# Patient Record
Sex: Female | Born: 1940 | Race: White | Hispanic: No | Marital: Married | Smoking: Never smoker
Health system: Southern US, Community
[De-identification: ages and names within clinical notes are randomized; demographics above are authoritative.]

## PROBLEM LIST (undated history)

## (undated) DIAGNOSIS — I10 Essential (primary) hypertension: Secondary | ICD-10-CM

## (undated) DIAGNOSIS — F419 Anxiety disorder, unspecified: Secondary | ICD-10-CM

## (undated) DIAGNOSIS — F0390 Unspecified dementia without behavioral disturbance: Secondary | ICD-10-CM

## (undated) DIAGNOSIS — G935 Compression of brain: Secondary | ICD-10-CM

## (undated) DIAGNOSIS — Z8489 Family history of other specified conditions: Secondary | ICD-10-CM

## (undated) DIAGNOSIS — I609 Nontraumatic subarachnoid hemorrhage, unspecified: Secondary | ICD-10-CM

## (undated) HISTORY — PX: APPENDECTOMY: SHX54

## (undated) HISTORY — PX: ABDOMINAL HYSTERECTOMY: SHX81

---

## 1998-05-13 DIAGNOSIS — I671 Cerebral aneurysm, nonruptured: Secondary | ICD-10-CM

## 1998-05-13 HISTORY — DX: Cerebral aneurysm, nonruptured: I67.1

## 2016-04-24 DIAGNOSIS — R69 Illness, unspecified: Secondary | ICD-10-CM | POA: Diagnosis not present

## 2016-05-08 DIAGNOSIS — Z0100 Encounter for examination of eyes and vision without abnormal findings: Secondary | ICD-10-CM | POA: Diagnosis not present

## 2016-06-18 DIAGNOSIS — G4700 Insomnia, unspecified: Secondary | ICD-10-CM | POA: Diagnosis not present

## 2016-06-18 DIAGNOSIS — M170 Bilateral primary osteoarthritis of knee: Secondary | ICD-10-CM | POA: Diagnosis not present

## 2016-06-18 DIAGNOSIS — I10 Essential (primary) hypertension: Secondary | ICD-10-CM | POA: Diagnosis not present

## 2016-06-18 DIAGNOSIS — E041 Nontoxic single thyroid nodule: Secondary | ICD-10-CM | POA: Diagnosis not present

## 2016-06-18 DIAGNOSIS — M79641 Pain in right hand: Secondary | ICD-10-CM | POA: Diagnosis not present

## 2016-06-18 DIAGNOSIS — E559 Vitamin D deficiency, unspecified: Secondary | ICD-10-CM | POA: Diagnosis not present

## 2016-06-18 DIAGNOSIS — E7800 Pure hypercholesterolemia, unspecified: Secondary | ICD-10-CM | POA: Diagnosis not present

## 2016-06-18 DIAGNOSIS — R002 Palpitations: Secondary | ICD-10-CM | POA: Diagnosis not present

## 2016-06-18 DIAGNOSIS — Z0001 Encounter for general adult medical examination with abnormal findings: Secondary | ICD-10-CM | POA: Diagnosis not present

## 2016-07-12 DIAGNOSIS — Z6823 Body mass index (BMI) 23.0-23.9, adult: Secondary | ICD-10-CM | POA: Diagnosis not present

## 2016-07-12 DIAGNOSIS — M1812 Unilateral primary osteoarthritis of first carpometacarpal joint, left hand: Secondary | ICD-10-CM | POA: Diagnosis not present

## 2016-08-30 DIAGNOSIS — R69 Illness, unspecified: Secondary | ICD-10-CM | POA: Diagnosis not present

## 2016-08-30 DIAGNOSIS — Z79899 Other long term (current) drug therapy: Secondary | ICD-10-CM | POA: Diagnosis not present

## 2016-08-30 DIAGNOSIS — M859 Disorder of bone density and structure, unspecified: Secondary | ICD-10-CM | POA: Diagnosis not present

## 2016-08-30 DIAGNOSIS — E785 Hyperlipidemia, unspecified: Secondary | ICD-10-CM | POA: Diagnosis not present

## 2016-08-30 DIAGNOSIS — M19042 Primary osteoarthritis, left hand: Secondary | ICD-10-CM | POA: Diagnosis not present

## 2016-08-30 DIAGNOSIS — Z7983 Long term (current) use of bisphosphonates: Secondary | ICD-10-CM | POA: Diagnosis not present

## 2016-08-30 DIAGNOSIS — Z0000 Encounter for general adult medical examination without abnormal findings: Secondary | ICD-10-CM | POA: Diagnosis not present

## 2016-08-30 DIAGNOSIS — H9113 Presbycusis, bilateral: Secondary | ICD-10-CM | POA: Diagnosis not present

## 2016-08-30 DIAGNOSIS — G4700 Insomnia, unspecified: Secondary | ICD-10-CM | POA: Diagnosis not present

## 2016-09-16 DIAGNOSIS — Z961 Presence of intraocular lens: Secondary | ICD-10-CM | POA: Diagnosis not present

## 2016-09-16 DIAGNOSIS — H04123 Dry eye syndrome of bilateral lacrimal glands: Secondary | ICD-10-CM | POA: Diagnosis not present

## 2016-10-03 DIAGNOSIS — Z1231 Encounter for screening mammogram for malignant neoplasm of breast: Secondary | ICD-10-CM | POA: Diagnosis not present

## 2017-01-09 DIAGNOSIS — R69 Illness, unspecified: Secondary | ICD-10-CM | POA: Diagnosis not present

## 2017-03-24 DIAGNOSIS — R69 Illness, unspecified: Secondary | ICD-10-CM | POA: Diagnosis not present

## 2017-06-17 DIAGNOSIS — Z803 Family history of malignant neoplasm of breast: Secondary | ICD-10-CM | POA: Diagnosis not present

## 2017-06-17 DIAGNOSIS — Z8673 Personal history of transient ischemic attack (TIA), and cerebral infarction without residual deficits: Secondary | ICD-10-CM | POA: Diagnosis not present

## 2017-06-17 DIAGNOSIS — Z809 Family history of malignant neoplasm, unspecified: Secondary | ICD-10-CM | POA: Diagnosis not present

## 2017-06-17 DIAGNOSIS — Z8249 Family history of ischemic heart disease and other diseases of the circulatory system: Secondary | ICD-10-CM | POA: Diagnosis not present

## 2017-06-17 DIAGNOSIS — F419 Anxiety disorder, unspecified: Secondary | ICD-10-CM | POA: Diagnosis not present

## 2017-06-17 DIAGNOSIS — R69 Illness, unspecified: Secondary | ICD-10-CM | POA: Diagnosis not present

## 2017-06-17 DIAGNOSIS — K5900 Constipation, unspecified: Secondary | ICD-10-CM | POA: Diagnosis not present

## 2017-07-10 DIAGNOSIS — R69 Illness, unspecified: Secondary | ICD-10-CM | POA: Diagnosis not present

## 2017-07-17 DIAGNOSIS — R69 Illness, unspecified: Secondary | ICD-10-CM | POA: Diagnosis not present

## 2017-07-28 ENCOUNTER — Observation Stay (HOSPITAL_COMMUNITY)
Admission: AD | Admit: 2017-07-28 | Discharge: 2017-07-29 | Disposition: A | Discharge status: Home / Self Care | Payer: Medicare HMO | Source: Ambulatory Visit | Attending: General Surgery | Admitting: General Surgery

## 2017-07-28 ENCOUNTER — Encounter (HOSPITAL_COMMUNITY): Payer: Self-pay

## 2017-07-28 ENCOUNTER — Inpatient Hospital Stay (HOSPITAL_COMMUNITY): Payer: Medicare HMO

## 2017-07-28 DIAGNOSIS — D3A020 Benign carcinoid tumor of the appendix: Secondary | ICD-10-CM | POA: Insufficient documentation

## 2017-07-28 DIAGNOSIS — K3580 Unspecified acute appendicitis: Principal | ICD-10-CM | POA: Insufficient documentation

## 2017-07-28 DIAGNOSIS — K37 Unspecified appendicitis: Secondary | ICD-10-CM | POA: Diagnosis present

## 2017-07-28 DIAGNOSIS — R1031 Right lower quadrant pain: Secondary | ICD-10-CM | POA: Diagnosis present

## 2017-07-28 DIAGNOSIS — Z9049 Acquired absence of other specified parts of digestive tract: Secondary | ICD-10-CM

## 2017-07-28 DIAGNOSIS — R109 Unspecified abdominal pain: Secondary | ICD-10-CM | POA: Diagnosis not present

## 2017-07-28 HISTORY — DX: Compression of brain: G93.5

## 2017-07-28 HISTORY — DX: Compression of brain: I60.9

## 2017-07-28 LAB — URINALYSIS, ROUTINE W REFLEX MICROSCOPIC
Bacteria, UA: NONE SEEN
Bilirubin Urine: NEGATIVE
Glucose, UA: 50 mg/dL — AB
Hgb urine dipstick: NEGATIVE
Ketones, ur: 20 mg/dL — AB
Nitrite: NEGATIVE
PH: 6 (ref 5.0–8.0)
Protein, ur: NEGATIVE mg/dL
SPECIFIC GRAVITY, URINE: 1.017 (ref 1.005–1.030)

## 2017-07-28 LAB — CBC
HEMATOCRIT: 39.7 % (ref 36.0–46.0)
Hemoglobin: 13.8 g/dL (ref 12.0–15.0)
MCH: 32.8 pg (ref 26.0–34.0)
MCHC: 34.8 g/dL (ref 30.0–36.0)
MCV: 94.3 fL (ref 78.0–100.0)
Platelets: 164 10*3/uL (ref 150–400)
RBC: 4.21 MIL/uL (ref 3.87–5.11)
RDW: 12.8 % (ref 11.5–15.5)
WBC: 12.9 10*3/uL — ABNORMAL HIGH (ref 4.0–10.5)

## 2017-07-28 LAB — COMPREHENSIVE METABOLIC PANEL
ALK PHOS: 66 U/L (ref 38–126)
ALT: 25 U/L (ref 14–54)
AST: 24 U/L (ref 15–41)
Albumin: 3.9 g/dL (ref 3.5–5.0)
Anion gap: 12 (ref 5–15)
BILIRUBIN TOTAL: 0.8 mg/dL (ref 0.3–1.2)
BUN: 12 mg/dL (ref 6–20)
CALCIUM: 9.5 mg/dL (ref 8.9–10.3)
CO2: 23 mmol/L (ref 22–32)
CREATININE: 0.7 mg/dL (ref 0.44–1.00)
Chloride: 99 mmol/L — ABNORMAL LOW (ref 101–111)
GFR calc Af Amer: >60 mL/min (ref >60)
GFR calc non Af Amer: >60 mL/min (ref >60)
GLUCOSE: 139 mg/dL — AB (ref 65–99)
Potassium: 3.9 mmol/L (ref 3.5–5.1)
Sodium: 134 mmol/L — ABNORMAL LOW (ref 135–145)
TOTAL PROTEIN: 7.3 g/dL (ref 6.5–8.1)

## 2017-07-28 MED ORDER — IOPAMIDOL (ISOVUE-300) INJECTION 61%
100.0000 mL | Freq: Once | INTRAVENOUS | Status: AC | PRN
  Administered 2017-07-28: 100 mL via INTRAVENOUS

## 2017-07-28 MED ORDER — IOPAMIDOL (ISOVUE-300) INJECTION 61%
30.0000 mL | INTRAVENOUS | Status: AC
  Administered 2017-07-28: 30 mL via ORAL

## 2017-07-28 MED ORDER — OXYCODONE-ACETAMINOPHEN 5-325 MG PO TABS
1.0000 | ORAL_TABLET | Freq: Once | ORAL | Status: AC
  Administered 2017-07-28: 1 via ORAL
  Filled 2017-07-28: qty 1

## 2017-07-28 NOTE — MAU Provider Note (Signed)
  History     CSN: [id number]  Arrival date and time: 07/28/17 1948   First Provider Initiated Contact with Patient 07/28/17 2044      Chief Complaint  Patient presents with  . Abdominal Pain   HPI Patient Shirley Woods is a 77 y.o. G11P2002 Non-pregnant female here with complaint of RLQ pain. She denies vaginal bleeding, unusual discharge, dysuria or other gyn complaint. She has had a total hysterectomy. She rates the pain an 8/10; she has not had an appetite today. She has felt nauseated, but has not thrown up or had diarrhea.  She says nothing makes it better or worse, and she has tried tylenol which did not help.  OB History    Gravida Para Term Preterm AB Living   2 2 2     2    SAB TAB Ectopic Multiple Live Births                  Past Medical History:  Diagnosis Date  . 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 ABDOMINAL HYSTERECTOMY      No family history on file.  Social History   Tobacco Use  . Smoking status: Never Smoker  . Smokeless tobacco: Never Used  Substance Use Topics  . Alcohol use: Yes  . Drug use: No    Allergies: No Known Allergies  No medications prior to admission.    Review of Systems  Constitutional: Negative.   HENT: Negative.   Respiratory: Negative.   Cardiovascular: Negative.   Gastrointestinal: Positive for abdominal pain and nausea. Negative for abdominal distention, blood in stool and constipation.  Endocrine: Negative.   Genitourinary: Negative.   Musculoskeletal: Negative.   Neurological: Negative.    Physical Exam   Blood pressure (!) 159/75, pulse 97, temperature 99.5 F (37.5 C), temperature source Oral, resp. rate 20, height 5\' 2"  (1.575 m), weight 121 lb 12 oz (55.2 kg).  Physical Exam  Constitutional: She is oriented to person, place, and time. She appears well-developed.  HENT:  Head: Normocephalic.  Eyes: Pupils are  equal, round, and reactive to light.  Neck: Normal range of motion.  Cardiovascular: Normal rate.  Respiratory: Effort normal.  GI: Soft.  Musculoskeletal: Normal range of motion.  Neurological: She is alert and oriented to person, place, and time.  Skin: Skin is warm and dry.  Psychiatric: She has a normal mood and affect.    MAU Course  Procedures  MDM -CT with contrast shows acute appendicitis -CBC and CMP-normal white count.  -Vitals stable Assessment and Plan   1. Acute appendicitis, unspecified acute appendicitis type    2. Patient stable for transfer to [place].  3. Dr. Dina Rich MD attending physician accepting transfer 4. Patient made aware of plan of care; RN to notify [place].   Mervyn Skeeters Alexandre Faries 07/28/2017, 9:13 PM

## 2017-07-28 NOTE — MAU Note (Signed)
PT SAYS SHE HAS RIGHT SIDED  ABD  PAIN - STARTED   YESTERDAY - WORSE  TODAY.    MOVED HERE FROM [place]-   HAS AN APPOINTMENT  IN 2  WEEKS   TO GET ESTABLISHED.    THIS AFTERNOON - HAD  TEMP 100.5-  NO TYLENOL TODAY .   YESTERDAY TOOK IBUPROFEN

## 2017-07-29 ENCOUNTER — Encounter (HOSPITAL_COMMUNITY): Admission: AD | Disposition: A | Discharge status: Home / Self Care | Payer: Self-pay | Source: Ambulatory Visit

## 2017-07-29 ENCOUNTER — Inpatient Hospital Stay (HOSPITAL_COMMUNITY): Payer: Medicare HMO | Admitting: Anesthesiology

## 2017-07-29 ENCOUNTER — Encounter (HOSPITAL_COMMUNITY): Payer: Self-pay | Admitting: Certified Registered"

## 2017-07-29 ENCOUNTER — Other Ambulatory Visit: Payer: Self-pay

## 2017-07-29 DIAGNOSIS — K3580 Unspecified acute appendicitis: Secondary | ICD-10-CM | POA: Diagnosis not present

## 2017-07-29 DIAGNOSIS — K37 Unspecified appendicitis: Secondary | ICD-10-CM | POA: Diagnosis present

## 2017-07-29 DIAGNOSIS — D3A020 Benign carcinoid tumor of the appendix: Secondary | ICD-10-CM | POA: Diagnosis not present

## 2017-07-29 DIAGNOSIS — Z9049 Acquired absence of other specified parts of digestive tract: Secondary | ICD-10-CM

## 2017-07-29 HISTORY — PX: LAPAROSCOPIC APPENDECTOMY: SHX408

## 2017-07-29 LAB — CBC
HCT: 37.8 % (ref 36.0–46.0)
HEMOGLOBIN: 12.4 g/dL (ref 12.0–15.0)
MCH: 32.2 pg (ref 26.0–34.0)
MCHC: 32.8 g/dL (ref 30.0–36.0)
MCV: 98.2 fL (ref 78.0–100.0)
Platelets: 153 10*3/uL (ref 150–400)
RBC: 3.85 MIL/uL — ABNORMAL LOW (ref 3.87–5.11)
RDW: 13.3 % (ref 11.5–15.5)
WBC: 11 10*3/uL — ABNORMAL HIGH (ref 4.0–10.5)

## 2017-07-29 LAB — CREATININE, SERUM
CREATININE: 0.91 mg/dL (ref 0.44–1.00)
GFR calc Af Amer: >60 mL/min (ref >60)
GFR calc non Af Amer: >60 mL/min (ref >60)

## 2017-07-29 LAB — SURGICAL PCR SCREEN
MRSA, PCR: NEGATIVE
STAPHYLOCOCCUS AUREUS: NEGATIVE

## 2017-07-29 SURGERY — APPENDECTOMY, LAPAROSCOPIC
Anesthesia: General | Site: Abdomen

## 2017-07-29 MED ORDER — ONDANSETRON 4 MG PO TBDP: 4.0000 mg | ORAL_TABLET | Freq: Four times a day (QID) | ORAL | Status: DC | PRN

## 2017-07-29 MED ORDER — HYDROMORPHONE HCL 1 MG/ML IJ SOLN: 0.5000 mg | INTRAMUSCULAR | Status: DC | PRN

## 2017-07-29 MED ORDER — MORPHINE SULFATE (PF) 4 MG/ML IV SOLN
INTRAVENOUS | Status: AC
  Filled 2017-07-29: qty 1

## 2017-07-29 MED ORDER — SUGAMMADEX SODIUM 200 MG/2ML IV SOLN
INTRAVENOUS | Status: DC | PRN
  Administered 2017-07-29: 100 mg via INTRAVENOUS

## 2017-07-29 MED ORDER — DEXTROSE-NACL 5-0.9 % IV SOLN
INTRAVENOUS | Status: DC
  Administered 2017-07-29: 04:00:00 via INTRAVENOUS

## 2017-07-29 MED ORDER — PHENYLEPHRINE HCL 10 MG/ML IJ SOLN
INTRAMUSCULAR | Status: DC | PRN
  Administered 2017-07-29: 80 ug via INTRAVENOUS

## 2017-07-29 MED ORDER — ONDANSETRON HCL 4 MG/2ML IJ SOLN: 4.0000 mg | Freq: Four times a day (QID) | INTRAMUSCULAR | Status: DC | PRN

## 2017-07-29 MED ORDER — DEXAMETHASONE SODIUM PHOSPHATE 10 MG/ML IJ SOLN
INTRAMUSCULAR | Status: DC | PRN
  Administered 2017-07-29: 10 mg via INTRAVENOUS

## 2017-07-29 MED ORDER — MORPHINE SULFATE (PF) 4 MG/ML IV SOLN: 2.0000 mg | INTRAVENOUS | Status: DC | PRN

## 2017-07-29 MED ORDER — HYDRALAZINE HCL 20 MG/ML IJ SOLN: 10.0000 mg | INTRAMUSCULAR | Status: DC | PRN

## 2017-07-29 MED ORDER — LORAZEPAM 2 MG/ML IJ SOLN
1.0000 mg | INTRAMUSCULAR | Status: DC | PRN
  Administered 2017-07-29: 1 mg via INTRAVENOUS
  Filled 2017-07-29: qty 1

## 2017-07-29 MED ORDER — METHOCARBAMOL 500 MG PO TABS: 500.0000 mg | ORAL_TABLET | Freq: Four times a day (QID) | ORAL | Status: DC | PRN

## 2017-07-29 MED ORDER — ENOXAPARIN SODIUM 40 MG/0.4ML ~~LOC~~ SOLN: 40.0000 mg | SUBCUTANEOUS | Status: DC

## 2017-07-29 MED ORDER — FENTANYL CITRATE (PF) 100 MCG/2ML IJ SOLN
INTRAMUSCULAR | Status: DC | PRN
  Administered 2017-07-29 (×2): 50 ug via INTRAVENOUS

## 2017-07-29 MED ORDER — OXYCODONE HCL 5 MG PO TABS
5.0000 mg | ORAL_TABLET | ORAL | Status: DC | PRN
  Administered 2017-07-29: 5 mg via ORAL
  Filled 2017-07-29: qty 1

## 2017-07-29 MED ORDER — CELECOXIB 200 MG PO CAPS
200.0000 mg | ORAL_CAPSULE | Freq: Two times a day (BID) | ORAL | Status: DC
  Administered 2017-07-29: 200 mg via ORAL
  Filled 2017-07-29: qty 1

## 2017-07-29 MED ORDER — METRONIDAZOLE IN NACL 5-0.79 MG/ML-% IV SOLN
500.0000 mg | Freq: Three times a day (TID) | INTRAVENOUS | Status: DC
  Administered 2017-07-29: 500 mg via INTRAVENOUS
  Filled 2017-07-29: qty 100

## 2017-07-29 MED ORDER — ROCURONIUM BROMIDE 100 MG/10ML IV SOLN
INTRAVENOUS | Status: DC | PRN
  Administered 2017-07-29: 30 mg via INTRAVENOUS
  Administered 2017-07-29: 10 mg via INTRAVENOUS

## 2017-07-29 MED ORDER — OXYCODONE HCL 5 MG PO TABS: 5.0000 mg | ORAL_TABLET | Freq: Four times a day (QID) | ORAL | 0 refills | Status: DC | PRN

## 2017-07-29 MED ORDER — ONDANSETRON HCL 4 MG/2ML IJ SOLN
INTRAMUSCULAR | Status: AC
  Filled 2017-07-29: qty 2

## 2017-07-29 MED ORDER — ACETAMINOPHEN 650 MG RE SUPP: 650.0000 mg | Freq: Four times a day (QID) | RECTAL | Status: DC | PRN

## 2017-07-29 MED ORDER — SODIUM CHLORIDE 0.9 % IR SOLN
Status: DC | PRN
  Administered 2017-07-29: 1000 mL

## 2017-07-29 MED ORDER — LACTATED RINGERS IV SOLN
INTRAVENOUS | Status: DC | PRN
  Administered 2017-07-29: 07:00:00 via INTRAVENOUS

## 2017-07-29 MED ORDER — GABAPENTIN 300 MG PO CAPS
300.0000 mg | ORAL_CAPSULE | Freq: Two times a day (BID) | ORAL | Status: DC
  Administered 2017-07-29: 300 mg via ORAL
  Filled 2017-07-29: qty 1

## 2017-07-29 MED ORDER — 0.9 % SODIUM CHLORIDE (POUR BTL) OPTIME
TOPICAL | Status: DC | PRN
  Administered 2017-07-29: 1000 mL

## 2017-07-29 MED ORDER — PROMETHAZINE HCL 25 MG/ML IJ SOLN: 6.2500 mg | INTRAMUSCULAR | Status: DC | PRN

## 2017-07-29 MED ORDER — DEXAMETHASONE SODIUM PHOSPHATE 10 MG/ML IJ SOLN
INTRAMUSCULAR | Status: AC
Start: 2017-07-29 — End: 2017-07-29
  Filled 2017-07-29: qty 1

## 2017-07-29 MED ORDER — DIPHENHYDRAMINE HCL 12.5 MG/5ML PO ELIX: 12.5000 mg | ORAL_SOLUTION | Freq: Four times a day (QID) | ORAL | Status: DC | PRN

## 2017-07-29 MED ORDER — FENTANYL CITRATE (PF) 250 MCG/5ML IJ SOLN
INTRAMUSCULAR | Status: AC
  Filled 2017-07-29: qty 5

## 2017-07-29 MED ORDER — DIPHENHYDRAMINE HCL 50 MG/ML IJ SOLN: 12.5000 mg | Freq: Four times a day (QID) | INTRAMUSCULAR | Status: DC | PRN

## 2017-07-29 MED ORDER — SODIUM CHLORIDE 0.9 % IV SOLN
2.0000 g | Freq: Every day | INTRAVENOUS | Status: DC
  Administered 2017-07-29: 2 g via INTRAVENOUS
  Filled 2017-07-29: qty 20

## 2017-07-29 MED ORDER — FENTANYL CITRATE (PF) 100 MCG/2ML IJ SOLN: 25.0000 ug | INTRAMUSCULAR | Status: DC | PRN

## 2017-07-29 MED ORDER — BUPIVACAINE-EPINEPHRINE 0.25% -1:200000 IJ SOLN
INTRAMUSCULAR | Status: DC | PRN
  Administered 2017-07-29: 16 mL

## 2017-07-29 MED ORDER — ACETAMINOPHEN 325 MG PO TABS: 650.0000 mg | ORAL_TABLET | Freq: Four times a day (QID) | ORAL | Status: DC | PRN

## 2017-07-29 MED ORDER — MORPHINE SULFATE (PF) 4 MG/ML IV SOLN
2.0000 mg | INTRAVENOUS | Status: DC | PRN
  Administered 2017-07-29: 2 mg via INTRAVENOUS
  Filled 2017-07-29: qty 1

## 2017-07-29 MED ORDER — LIDOCAINE HCL (CARDIAC) 20 MG/ML IV SOLN
INTRAVENOUS | Status: DC | PRN
  Administered 2017-07-29: 60 mg via INTRAVENOUS

## 2017-07-29 MED ORDER — SUGAMMADEX SODIUM 200 MG/2ML IV SOLN
INTRAVENOUS | Status: AC
  Filled 2017-07-29: qty 2

## 2017-07-29 MED ORDER — BUPIVACAINE-EPINEPHRINE (PF) 0.25% -1:200000 IJ SOLN
INTRAMUSCULAR | Status: AC
  Filled 2017-07-29: qty 30

## 2017-07-29 MED ORDER — PROPOFOL 10 MG/ML IV BOLUS
INTRAVENOUS | Status: AC
  Filled 2017-07-29: qty 20

## 2017-07-29 MED ORDER — PROPOFOL 10 MG/ML IV BOLUS
INTRAVENOUS | Status: DC | PRN
  Administered 2017-07-29: 110 mg via INTRAVENOUS

## 2017-07-29 MED ORDER — KCL IN DEXTROSE-NACL 20-5-0.45 MEQ/L-%-% IV SOLN
INTRAVENOUS | Status: DC
  Administered 2017-07-29: 10:00:00 via INTRAVENOUS
  Filled 2017-07-29: qty 1000

## 2017-07-29 MED ORDER — ONDANSETRON HCL 4 MG/2ML IJ SOLN
INTRAMUSCULAR | Status: DC | PRN
  Administered 2017-07-29: 4 mg via INTRAVENOUS

## 2017-07-29 SURGICAL SUPPLY — 41 items
APPLIER CLIP ROT 10 11.4 M/L (STAPLE)
BLADE CLIPPER SURG (BLADE) IMPLANT
CANISTER SUCT 3000ML PPV (MISCELLANEOUS) ×2 IMPLANT
CHLORAPREP W/TINT 26ML (MISCELLANEOUS) ×2 IMPLANT
CLIP APPLIE ROT 10 11.4 M/L (STAPLE) IMPLANT
COVER SURGICAL LIGHT HANDLE (MISCELLANEOUS) ×2 IMPLANT
CUTTER FLEX LINEAR 45M (STAPLE) ×2 IMPLANT
DERMABOND ADVANCED (GAUZE/BANDAGES/DRESSINGS) ×1
DERMABOND ADVANCED .7 DNX12 (GAUZE/BANDAGES/DRESSINGS) ×1 IMPLANT
ELECT REM PT RETURN 9FT ADLT (ELECTROSURGICAL) ×2
ELECTRODE REM PT RTRN 9FT ADLT (ELECTROSURGICAL) ×1 IMPLANT
GLOVE BIO SURGEON STRL SZ8 (GLOVE) ×2 IMPLANT
GLOVE BIOGEL PI IND STRL 8 (GLOVE) ×1 IMPLANT
GLOVE BIOGEL PI INDICATOR 8 (GLOVE) ×1
GOWN STRL REUS W/ TWL LRG LVL3 (GOWN DISPOSABLE) ×2 IMPLANT
GOWN STRL REUS W/ TWL XL LVL3 (GOWN DISPOSABLE) ×1 IMPLANT
GOWN STRL REUS W/TWL LRG LVL3 (GOWN DISPOSABLE) ×2
GOWN STRL REUS W/TWL XL LVL3 (GOWN DISPOSABLE) ×1
KIT BASIN OR (CUSTOM PROCEDURE TRAY) ×2 IMPLANT
KIT ROOM TURNOVER OR (KITS) ×2 IMPLANT
NEEDLE 22X1 1/2 (OR ONLY) (NEEDLE) ×2 IMPLANT
NS IRRIG 1000ML POUR BTL (IV SOLUTION) ×2 IMPLANT
PAD ARMBOARD 7.5X6 YLW CONV (MISCELLANEOUS) ×4 IMPLANT
POUCH RETRIEVAL ECOSAC 10 (ENDOMECHANICALS) ×1 IMPLANT
POUCH RETRIEVAL ECOSAC 10MM (ENDOMECHANICALS) ×1
RELOAD 45 VASCULAR/THIN (ENDOMECHANICALS) ×2 IMPLANT
RELOAD STAPLE TA45 3.5 REG BLU (ENDOMECHANICALS) IMPLANT
SCISSORS LAP 5X35 DISP (ENDOMECHANICALS) IMPLANT
SET IRRIG TUBING LAPAROSCOPIC (IRRIGATION / IRRIGATOR) ×2 IMPLANT
SHEARS HARMONIC ACE PLUS 36CM (ENDOMECHANICALS) ×2 IMPLANT
SPECIMEN JAR SMALL (MISCELLANEOUS) ×2 IMPLANT
SUT VIC AB 4-0 PS2 27 (SUTURE) ×2 IMPLANT
TOWEL OR 17X24 6PK STRL BLUE (TOWEL DISPOSABLE) ×2 IMPLANT
TOWEL OR 17X26 10 PK STRL BLUE (TOWEL DISPOSABLE) ×2 IMPLANT
TRAY FOLEY CATH SILVER 16FR (SET/KITS/TRAYS/PACK) ×2 IMPLANT
TRAY LAPAROSCOPIC MC (CUSTOM PROCEDURE TRAY) ×2 IMPLANT
TROCAR XCEL 12X100 BLDLESS (ENDOMECHANICALS) ×2 IMPLANT
TROCAR XCEL BLUNT TIP 100MML (ENDOMECHANICALS) ×2 IMPLANT
TROCAR XCEL NON-BLD 5MMX100MML (ENDOMECHANICALS) ×2 IMPLANT
TUBING INSUFFLATION (TUBING) ×2 IMPLANT
WATER STERILE IRR 1000ML POUR (IV SOLUTION) ×2 IMPLANT

## 2017-07-29 NOTE — ED Notes (Signed)
[facility] from [facility].  Patient is new to [place], went to [facility] with loss of appetite with abdominal pain and fever and was given a percocet which got rid of her pain.  Patient CAOx 4, 20G LAC.  General Surgeon to see.

## 2017-07-29 NOTE — Discharge Instructions (Signed)

## 2017-07-29 NOTE — Discharge Summary (Signed)
[facility]/Trauma Discharge Summary   Patient ID: Shirley Woods MRN: [id number] DOB/AGE: 10/02/1940 77 y.o.  Admit date: 07/28/2017 Discharge date: 07/29/2017  Admitting Diagnosis: appendicitis  Discharge Diagnosis Patient Active Problem List   Diagnosis Date Noted  . Appendicitis 07/29/2017  . S/P laparoscopic appendectomy 07/29/2017    Consultants none  Imaging: Ct Abdomen Pelvis W Contrast  Result Date: 07/28/2017 CLINICAL DATA:  Right-sided abdominal pain with fever EXAM: CT ABDOMEN AND PELVIS WITH CONTRAST TECHNIQUE: Multidetector CT imaging of the abdomen and pelvis was performed using the standard protocol following bolus administration of intravenous contrast. CONTRAST:  149mL ISOVUE-300 IOPAMIDOL (ISOVUE-300) INJECTION 61%, <See Chart> ISOVUE-300 IOPAMIDOL (ISOVUE-300) INJECTION 61% COMPARISON:  None. FINDINGS: Lower chest: Lung bases demonstrate no acute consolidation or pleural effusion. Normal heart size. Hepatobiliary: No focal liver abnormality is seen. No gallstones, gallbladder wall thickening, or biliary dilatation. Pancreas: Unremarkable. No pancreatic ductal dilatation or surrounding inflammatory changes. Fatty infiltration Spleen: Normal in size without focal abnormality. Multiple calcified granuloma Adrenals/Urinary Tract: Adrenal glands are within normal limits. No hydronephrosis. Probable parapelvic cysts on the left. The bladder is unremarkable. Stomach/Bowel: Stomach is nonenlarged. No dilated small bowel. Thickening of the terminal ileum and base of the cecum. Enlarged appendix measuring 11 mm with moderate-to-marked surrounding inflammation. Vascular/Lymphatic: Nonaneurysmal aorta. Mild aortic atherosclerosis. No significantly enlarged lymph nodes Reproductive: Status post hysterectomy. No adnexal masses. Other: Negative for free air or free fluid. Small fat in the umbilical region Musculoskeletal: Degenerative changes. No acute or suspicious  abnormality IMPRESSION: 1. Findings consistent with acute appendicitis. Appendix: Location: Right lower quadrant Diameter: 11 mm Appendicolith: None Mucosal hyper-enhancement: Not seen Extraluminal gas: Not seen Periappendiceal collection: Not seen 2.   Splenic granuloma Critical Value/emergent results were called by telephone at the time of interpretation on 07/28/2017 at 11:33 pm to Dr. Maye Hides , who verbally acknowledged these results. Electronically Signed   By: Donavan Foil M.D.   On: 07/28/2017 23:33    Procedures Dr. Grandville Silos (07/29/17) - Laparoscopic Appendectomy  Hospital Course:  Pt is a 77 yo female who presented to [facility] with abdominal pain.  Workup showed appendicitis.  Patient was admitted and underwent procedure listed above.  Tolerated procedure well and was transferred to the floor.  Diet was advanced as tolerated.  On POD#0, the patient was voiding well, tolerating diet, ambulating well, pain well controlled, vital signs stable, incisions c/d/i and felt stable for discharge home.  Patient will follow up in our office in 2 weeks and knows to call with questions or concerns.     Patient was discharged in good condition.  The [place] Substance controlled database was reviewed prior to prescribing narcotic pain medication to this patient.  Physical Exam: General:  Alert, NAD, pleasant, cooperative Cardio: RRR, S1 & S2 normal, no murmur, rubs, gallops Resp: Effort normal, lungs CTA bilaterally, no wheezes, rales, rhonchi Abd:  Soft, ND, normal bowel sounds, very mild tenderness, incisions with glue intact, mild surrounding ecchymosis and are without bleeding Skin: warm and dry, no rashes noted  Allergies as of 07/29/2017   No Known Allergies     Medication List    TAKE these medications   LORazepam 1 MG tablet Commonly known as:  ATIVAN Take 1 mg by mouth at bedtime.   OVER THE COUNTER MEDICATION Take 3 tablets by mouth 2 (two) times daily. algea cal    OVER THE COUNTER MEDICATION Take 1 tablet by mouth 2 (two) times daily. stronium boost   oxyCODONE  5 MG immediate release tablet Commonly known as:  Oxy IR/ROXICODONE Take 1 tablet (5 mg total) by mouth every 6 (six) hours as needed for moderate pain.   sertraline 100 MG tablet Commonly known as:  ZOLOFT Take 100 mg by mouth daily.        Follow-up Information    [facility]. Go on 08/12/2017.   Specialty:  General Surgery Why:  Your appointment is at 11:45 AM. Please arrive 30 min prior appointment time. Bring photo ID and insurance information.  Contact information: [address] [place] [address] [phone]           Signed: [facility] 07/29/2017, 3:05 PM Pager: [phone] Consults: [phone] Mon-Fri 7:00 am-4:30 pm Sat-Sun 7:00 am-11:30 am

## 2017-07-29 NOTE — Progress Notes (Signed)
Verified waste of oxycodone 5mg  tablet with Mendel Ryder B. Edison Pace, RN in Radiation protection practitioner

## 2017-07-29 NOTE — Transfer of Care (Signed)
Immediate Anesthesia Transfer of Care Note  Patient: Shirley Woods  Procedure(s) Performed: APPENDECTOMY LAPAROSCOPIC (N/A Abdomen)  Patient Location: PACU  Anesthesia Type:General  Level of Consciousness: awake, alert  and oriented  Airway & Oxygen Therapy: Patient Spontanous Breathing  Post-op Assessment: Report given to RN and Post -op Vital signs reviewed and stable  Post vital signs: Reviewed and stable  Last Vitals:  Vitals:   07/29/17 0322 07/29/17 0500  BP: 137/73 128/68  Pulse: 88 92  Resp: 18 17  Temp: 37.4 C 37.3 C  SpO2: 99% 98%    Last Pain:  Vitals:   07/29/17 0500  TempSrc: Oral  PainSc:          Complications: No apparent anesthesia complications

## 2017-07-29 NOTE — H&P (Signed)
Shirley Woods is an 77 y.o. female.   Chief Complaint: abdominal pain HPI: pt sent from [facility].  1 day hx of RLQ abdominal pain  Sharp amade worse with movement and progressive.  No N/V.  CT shows uncomplicated appendicitis.    Past Medical History:  Diagnosis Date  . 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 ABDOMINAL HYSTERECTOMY      No family history on file. Social History:  reports that  has never smoked. she has never used smokeless tobacco. She reports that she drinks alcohol. She reports that she does not use drugs.  Allergies: No Known Allergies   (Not in a hospital admission)  Results for orders placed or performed during the hospital encounter of 07/28/17 (from the past 48 hour(s))  Urinalysis, Routine w reflex microscopic     Status: Abnormal   Collection Time: 07/28/17  8:04 PM  Result Value Ref Range   Color, Urine YELLOW YELLOW   APPearance CLEAR CLEAR   Specific Gravity, Urine 1.017 1.005 - 1.030   pH 6.0 5.0 - 8.0   Glucose, UA 50 (A) NEGATIVE mg/dL   Hgb urine dipstick NEGATIVE NEGATIVE   Bilirubin Urine NEGATIVE NEGATIVE   Ketones, ur 20 (A) NEGATIVE mg/dL   Protein, ur NEGATIVE NEGATIVE mg/dL   Nitrite NEGATIVE NEGATIVE   Leukocytes, UA TRACE (A) NEGATIVE   RBC / HPF 0-5 0 - 5 RBC/hpf   WBC, UA 0-5 0 - 5 WBC/hpf   Bacteria, UA NONE SEEN NONE SEEN   Squamous Epithelial / LPF 0-5 (A) NONE SEEN   Mucus PRESENT     Comment: Performed at [facility], [address] [place], [place] [address]  Comprehensive metabolic panel     Status: Abnormal   Collection Time: 07/28/17  8:58 PM  Result Value Ref Range   Sodium 134 (L) 135 - 145 mmol/L   Potassium 3.9 3.5 - 5.1 mmol/L   Chloride 99 (L) 101 - 111 mmol/L   CO2 23 22 - 32 mmol/L   Glucose, Bld 139 (H) 65 - 99 mg/dL   BUN 12 6 - 20 mg/dL   Creatinine, Ser 0.70 0.44 - 1.00 mg/dL   Calcium 9.5 8.9 - 10.3 mg/dL   Total Protein 7.3 6.5 - 8.1 g/dL   Albumin 3.9 3.5 - 5.0 g/dL   AST 24 15 - 41 U/L   ALT 25 14 - 54 U/L   Alkaline Phosphatase 66 38 - 126 U/L   Total Bilirubin 0.8 0.3 - 1.2 mg/dL   GFR calc non Af Amer >60 >60 mL/min   GFR calc Af Amer >60 >60 mL/min    Comment: (NOTE) The eGFR has been calculated using the CKD EPI equation. This calculation has not been validated in all clinical situations. eGFR's persistently <60 mL/min signify possible Chronic Kidney Disease.    Anion gap 12 5 - 15    Comment: Performed at [facility], [address] [place], [place] [address]  CBC     Status: Abnormal   Collection Time: 07/28/17  8:58 PM  Result Value Ref Range   WBC 12.9 (H) 4.0 - 10.5 K/uL   RBC 4.21 3.87 - 5.11 MIL/uL   Hemoglobin 13.8 12.0 - 15.0 g/dL   HCT 39.7 36.0 - 46.0 %   MCV 94.3 78.0 - 100.0 fL   MCH 32.8 26.0 - 34.0 pg   MCHC 34.8 30.0 -  36.0 g/dL   RDW 12.8 11.5 - 15.5 %   Platelets 164 150 - 400 K/uL    Comment: Performed at [facility], [address], [place], [place] [address]   Ct Abdomen Pelvis W Contrast  Result Date: 07/28/2017 CLINICAL DATA:  Right-sided abdominal pain with fever EXAM: CT ABDOMEN AND PELVIS WITH CONTRAST TECHNIQUE: Multidetector CT imaging of the abdomen and pelvis was performed using the standard protocol following bolus administration of intravenous contrast. CONTRAST:  150m ISOVUE-300 IOPAMIDOL (ISOVUE-300) INJECTION 61%, <See Chart> ISOVUE-300 IOPAMIDOL (ISOVUE-300) INJECTION 61% COMPARISON:  None. FINDINGS: Lower chest: Lung bases demonstrate no acute consolidation or pleural effusion. Normal heart size. Hepatobiliary: No focal liver abnormality is seen. No gallstones, gallbladder wall thickening, or biliary dilatation. Pancreas: Unremarkable. No pancreatic ductal dilatation or surrounding inflammatory changes. Fatty infiltration Spleen: Normal in size without focal abnormality. Multiple calcified granuloma Adrenals/Urinary Tract: Adrenal  glands are within normal limits. No hydronephrosis. Probable parapelvic cysts on the left. The bladder is unremarkable. Stomach/Bowel: Stomach is nonenlarged. No dilated small bowel. Thickening of the terminal ileum and base of the cecum. Enlarged appendix measuring 11 mm with moderate-to-marked surrounding inflammation. Vascular/Lymphatic: Nonaneurysmal aorta. Mild aortic atherosclerosis. No significantly enlarged lymph nodes Reproductive: Status post hysterectomy. No adnexal masses. Other: Negative for free air or free fluid. Small fat in the umbilical region Musculoskeletal: Degenerative changes. No acute or suspicious abnormality IMPRESSION: 1. Findings consistent with acute appendicitis. Appendix: Location: Right lower quadrant Diameter: 11 mm Appendicolith: None Mucosal hyper-enhancement: Not seen Extraluminal gas: Not seen Periappendiceal collection: Not seen 2.   Splenic granuloma Critical Value/emergent results were called by telephone at the time of interpretation on 07/28/2017 at 11:33 pm to Dr. KMaye Hides, who verbally acknowledged these results. Electronically Signed   By: KDonavan FoilM.D.   On: 07/28/2017 23:33    Review of Systems  Constitutional: Positive for malaise/fatigue. Negative for chills and weight loss.  HENT: Negative for hearing loss and tinnitus.   Eyes: Negative for blurred vision and double vision.  Respiratory: Negative for cough and shortness of breath.   Cardiovascular: Positive for chest pain and palpitations.  Gastrointestinal: Positive for abdominal pain. Negative for nausea and vomiting.  Genitourinary: Negative for dysuria.  Musculoskeletal: Positive for myalgias.  Skin: Negative for itching and rash.  Neurological: Negative for dizziness.  Psychiatric/Behavioral: Negative for depression.    Blood pressure 132/77, pulse 79, temperature 98.5 F (36.9 C), temperature source Oral, resp. rate 16, height 5' 2"  (1.575 m), weight 55.2 kg (121 lb 12 oz), SpO2  95 %. Physical Exam  Constitutional: She appears well-developed and well-nourished.  HENT:  Head: Normocephalic and atraumatic.  Eyes: EOM are normal. Pupils are equal, round, and reactive to light.  Neck: Normal range of motion. Neck supple.  Cardiovascular: Normal rate.  Respiratory: Effort normal and breath sounds normal.  GI: There is tenderness in the right lower quadrant. There is tenderness at McBurney's point.  Neurological: She is alert.  Skin: Skin is warm and dry.  Psychiatric: She has a normal mood and affect. Her behavior is normal.     Assessment/Plan Acute appendicitis  Admit  IV and ABX  Appendectomy later this am with Dr THelmut Muster MD 07/29/2017, 1:15 AM

## 2017-07-29 NOTE — ED Provider Notes (Signed)
MSE was initiated and I personally evaluated the patient and placed orders (if any) at  12:43 AM on July 29, 2017.  Patient transferred from [facility] where she was evaluated for RLQ abdominal pain, anorexia and fever with labs and CT that confirmed a diagnosis of acute appendicitis. CT reviewed and shows appendiceal enlargement and inflammation without obvious perforation or abscess.   The patient appears comfortable on arrival. She received a Percocet approximately 4 hours ago and states her pain continues to be controlled. No other by mouth food or drink other than CM for CT scan today.   Patient is not anticoagulated. VS [facility] are stable: BP 159/75, HR 97, R 20, T 99.5.  Dr. Brantley Stage (general surgery) paged. Patient is pending admission for appendicitis.  12:50 - discussed with dr. Brantley Stage who will assume care of the patient.  The patient appears stable so that the remainder of the MSE may be completed by another provider.   Charlann Lange, PA-C 07/29/17 8675    Merryl Hacker, MD 07/29/17 [phone]

## 2017-07-29 NOTE — Progress Notes (Signed)
Wasted Oxycodone 5mg  tablet in sharps container. Shanon Ace, RN as witness.

## 2017-07-29 NOTE — Progress Notes (Signed)
Patient ID: Shirley Woods, female   DOB: 07/20/1940, 76 y.o.   MRN: [id number] Patient examined. Agree with Dr. Josetta Huddle A&P. Acute appendicitis. For laparoscopic appendectomy this AM. Procedure, risks, and benefits explained and she agrees.  Georganna Skeans, MD, MPH, FACS Trauma: [phone] General Surgery: [phone]

## 2017-07-29 NOTE — Care Management CC44 (Signed)
Condition Code 44 Documentation Completed  Patient Details  Name: Shirley Woods MRN: [id number] Date of Birth: Aug 30, 1940   Condition Code 44 given:   yes Patient signature on Condition Code 44 notice:   yes Documentation of 2 MD's agreement:   yes Code 44 added to claim:   yes  Explained Code 44 to patient and husband at bedside . Both voiced understanding and husband signed hard copy . Marilu Favre, RN 07/29/2017, 10:52 AM

## 2017-07-29 NOTE — Anesthesia Preprocedure Evaluation (Addendum)
Anesthesia Evaluation  Patient identified by MRN, date of birth, ID band Patient awake    Reviewed: Allergy & Precautions, NPO status , Patient's Chart, lab work & pertinent test results  Airway Mallampati: II  TM Distance: >3 FB Neck ROM: Full    Dental  (+) Dental Advisory Given   Pulmonary neg pulmonary ROS,    breath sounds clear to auscultation       Cardiovascular negative cardio ROS   Rhythm:Regular Rate:Normal     Neuro/Psych negative neurological ROS     GI/Hepatic Neg liver ROS, Acute appendicitis   Endo/Other  negative endocrine ROS  Renal/GU negative Renal ROS     Musculoskeletal   Abdominal   Peds  Hematology negative hematology ROS (+)   Anesthesia Other Findings   Reproductive/Obstetrics                            Lab Results  Component Value Date   WBC 12.9 (H) 07/28/2017   HGB 13.8 07/28/2017   HCT 39.7 07/28/2017   MCV 94.3 07/28/2017   PLT 164 07/28/2017   Lab Results  Component Value Date   CREATININE 0.70 07/28/2017   BUN 12 07/28/2017   NA 134 (L) 07/28/2017   K 3.9 07/28/2017   CL 99 (L) 07/28/2017   CO2 23 07/28/2017    Anesthesia Physical Anesthesia Plan  ASA: II  Anesthesia Plan: General   Post-op Pain Management:    Induction: Intravenous and Rapid sequence  PONV Risk Score and Plan: 3 and Dexamethasone, Ondansetron and Treatment may vary due to age or medical condition  Airway Management Planned: Oral ETT  Additional Equipment:   Intra-op Plan:   Post-operative Plan: Extubation in OR  Informed Consent: I have reviewed the patients History and Physical, chart, labs and discussed the procedure including the risks, benefits and alternatives for the proposed anesthesia with the patient or authorized representative who has indicated his/her understanding and acceptance.   Dental advisory given  Plan Discussed with: CRNA  Anesthesia  Plan Comments:        Anesthesia Quick Evaluation

## 2017-07-29 NOTE — Op Note (Signed)
07/29/2017  8:07 AM  PATIENT:  Shirley Woods  77 y.o. female  PRE-OPERATIVE DIAGNOSIS:  Appendicitis  POST-OPERATIVE DIAGNOSIS:  Appendicitis  PROCEDURE:  Procedure(s): APPENDECTOMY LAPAROSCOPIC  SURGEON:  Surgeon(s): Georganna Skeans, MD  ASSISTANTS: none   ANESTHESIA:   local and general  EBL:  Total I/O In: -  Out: 100 [Urine:100]  BLOOD ADMINISTERED:none  DRAINS: none   SPECIMEN:  Excision  DISPOSITION OF SPECIMEN:  PATHOLOGY  COUNTS:  YES  DICTATION: .Dragon Dictation Findings: Acute appendicitis without perforation  Procedure in detail: [name] is brought for appendectomy.  She received intravenous antibiotics.  Informed consent was obtained.  She was brought to the operating room and general endotracheal anesthesia was administered by the anesthesia staff.  Foley catheter was placed by nursing.  Abdomen was prepped and draped in sterile fashion.  Timeout procedure was done.The infraumbilical region was infiltrated with local. Infraumbilical incision was made. Subcutaneous tissues were dissected down revealing the anterior fascia. This was divided sharply along the midline. Peritoneal cavity was entered under direct vision without complication. A 0 Vicryl pursestring was placed around the fascial opening. Hassan trocar was inserted into the abdomen. The abdomen was insufflated with carbon dioxide in standard fashion. Under direct vision a 12 mm left lower quadrant and a 5 mm right mid abdomen port were placed.  Local was used at each port site.  Laparoscopic exploration revealed omentum wrapped around a very inflamed appendix.  The omentum was gently removed.  The mesoappendix was divided with a harmonic scalpel achieving excellent hemostasis.  The base of the appendix was divided with Endo GIA with vascular load.  There was a good staple line along the cecum.  The appendix was placed in a bag and removed from the abdomen.  It was sent to pathology.  The abdomen was copiously  irrigated.  There was no bleeding and the staple line was intact.  Irrigation fluid was evacuated.  Ports were removed under direct vision.  Pneumoperitoneum was released.  Infraumbilical fascia was closed by tying the pursestring.  All 3 wounds were irrigated and the skin of each was closed with running 4-0 Vicryl followed by Dermabond.  All counts were correct.  She tolerated the procedure well without apparent complication and was taken recovery in stable condition.  PATIENT DISPOSITION:  PACU - hemodynamically stable.   Delay start of Pharmacological VTE agent (>24hrs) due to surgical blood loss or risk of bleeding:  no  Georganna Skeans, MD, MPH, FACS Pager: [phone]  3/19/20198:07 AM

## 2017-07-29 NOTE — MAU Note (Signed)
[facility] at bedside 

## 2017-07-29 NOTE — Anesthesia Procedure Notes (Signed)
Procedure Name: Intubation Date/Time: 07/29/2017 7:31 AM Performed by: Babs Bertin, CRNA Pre-anesthesia Checklist: Patient identified, Emergency Drugs available, Suction available and Patient being monitored Patient Re-evaluated:Patient Re-evaluated prior to induction Oxygen Delivery Method: Circle system utilized Preoxygenation: Pre-oxygenation with 100% oxygen Induction Type: IV induction Ventilation: Mask ventilation without difficulty Laryngoscope Size: Mac and 3 Grade View: Grade I Tube type: Oral Laser Tube: Cuffed inflated with minimal occlusive pressure - saline Tube size: 7.5 mm Number of attempts: 1 Airway Equipment and Method: Stylet Placement Confirmation: ETT inserted through vocal cords under direct vision,  positive ETCO2 and breath sounds checked- equal and bilateral Secured at: 21 cm Tube secured with: Tape Dental Injury: Teeth and Oropharynx as per pre-operative assessment

## 2017-07-29 NOTE — Progress Notes (Signed)
Patient discharged to home. Verbalizes understanding of all discharge instructions including incision care, discharge medications, and follow up MD visits. Patient accompanied by spouse.  

## 2017-07-29 NOTE — Progress Notes (Signed)
Spoke with Jonni Sanger in the pharmacy. Due to the glitch in the Pyxis- [facility] PRN medications can not be accessed. The pyxis was placed in critical overdrive. RN obtained Morphine and administered 2mg  at 1220.

## 2017-07-29 NOTE — ED Notes (Signed)
Attempted to call report

## 2017-07-30 ENCOUNTER — Encounter (HOSPITAL_COMMUNITY): Payer: Self-pay | Admitting: General Surgery

## 2017-07-30 NOTE — Anesthesia Postprocedure Evaluation (Signed)
Anesthesia Post Note  Patient: Shirley Woods  Procedure(s) Performed: APPENDECTOMY LAPAROSCOPIC (N/A Abdomen)     Patient location during evaluation: PACU Anesthesia Type: General Level of consciousness: awake and alert Pain management: pain level controlled Vital Signs Assessment: post-procedure vital signs reviewed and stable Respiratory status: spontaneous breathing, nonlabored ventilation, respiratory function stable and patient connected to nasal cannula oxygen Cardiovascular status: blood pressure returned to baseline and stable Postop Assessment: no apparent nausea or vomiting Anesthetic complications: no    Last Vitals:  Vitals:   07/29/17 0907 07/29/17 1340  BP: (!) 120/59 129/62  Pulse: 84 79  Resp: 17 18  Temp: 36.9 C 36.7 C  SpO2: 97% 97%    Last Pain:  Vitals:   07/29/17 1340  TempSrc: Oral  PainSc:                  Tiajuana Amass

## 2017-08-19 DIAGNOSIS — F5101 Primary insomnia: Secondary | ICD-10-CM | POA: Diagnosis not present

## 2017-08-19 DIAGNOSIS — M25511 Pain in right shoulder: Secondary | ICD-10-CM | POA: Diagnosis not present

## 2017-08-19 DIAGNOSIS — R69 Illness, unspecified: Secondary | ICD-10-CM | POA: Diagnosis not present

## 2017-08-21 ENCOUNTER — Other Ambulatory Visit: Payer: Self-pay

## 2017-08-21 ENCOUNTER — Ambulatory Visit: Payer: Medicare HMO | Attending: Family Medicine | Admitting: Physical Therapy

## 2017-08-21 ENCOUNTER — Encounter: Payer: Self-pay | Admitting: Physical Therapy

## 2017-08-21 DIAGNOSIS — G8929 Other chronic pain: Secondary | ICD-10-CM | POA: Diagnosis not present

## 2017-08-21 DIAGNOSIS — M25511 Pain in right shoulder: Secondary | ICD-10-CM | POA: Diagnosis not present

## 2017-08-21 DIAGNOSIS — M6281 Muscle weakness (generalized): Secondary | ICD-10-CM | POA: Insufficient documentation

## 2017-08-21 DIAGNOSIS — M25611 Stiffness of right shoulder, not elsewhere classified: Secondary | ICD-10-CM | POA: Insufficient documentation

## 2017-08-21 DIAGNOSIS — M62838 Other muscle spasm: Secondary | ICD-10-CM | POA: Insufficient documentation

## 2017-08-21 NOTE — Patient Instructions (Signed)
Access Code: [email or web address]  URL: [email or web address]  Date: 08/21/2017  Prepared by: Lovett Calender   Exercises  Open Book Chest Stretch on Towel Roll - 5 reps - 1 sets - 10 sec hold - 2x daily - 7x weekly  Single Arm Doorway Pec Stretch at 60 Elevation - 3 reps - 1 sets - 30 sec hold - 2x daily - 7x weekly

## 2017-08-21 NOTE — Therapy (Signed)
[facility] [address] [place], [place], [address] Phone: [phone]   Fax:  [phone]  Physical Therapy Evaluation  Patient Details  Name: Shirley Woods MRN: [id number] Date of Birth: 03/02/1941 Referring Provider: Lujean Amel, MD   Encounter Date: 08/21/2017  PT End of Session - 08/21/17 1702    Visit Number  1    Number of Visits  10    Date for PT Re-Evaluation  10/16/17    Authorization Type  aetna medicare    PT Start Time  1015    PT Stop Time  1055    PT Time Calculation (min)  40 min    Activity Tolerance  Patient tolerated treatment well    Behavior During Therapy  [facility] for tasks assessed/performed       Past Medical History:  Diagnosis Date  . 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 ABDOMINAL HYSTERECTOMY    . LAPAROSCOPIC APPENDECTOMY N/A 07/29/2017   Procedure: APPENDECTOMY LAPAROSCOPIC;  Surgeon: Georganna Skeans, MD;  Location: [facility];  Service: General;  Laterality: N/A;    There were no vitals filed for this visit.   Subjective Assessment - 08/21/17 1018    Subjective  Pt is having anterior shoulder pain that began a couple of years ago.   She has had cortisone injections in the past.  Currently, reports pain has gotten worse and feeling weaker.    Limitations  Lifting;Writing    Patient Stated Goals  be able to use computer and exercises without pain    Currently in Pain?  No/denies no pain currently    Pain Score  -- no pain current but wakes at night and using computer    Pain Location  Shoulder    Pain Orientation  Right    Pain Descriptors / Indicators  Sore;Sharp    Pain Type  Chronic pain    Pain Onset  More than a month ago    Pain Frequency  Intermittent    Aggravating Factors   reaching with weight, doing exercises, night and sleeping on it, using the computer    Pain Relieving  Factors  bringing shoulder back    Effect of Pain on Daily Activities  sleeping, working out, typing on computer    Multiple Pain Sites  No         [facility] PT Assessment - 08/21/17 0001      Assessment   Medical Diagnosis  M25.511 (ICD-10-CM) - Pain in right shoulder    Referring Provider  Koirala, Dibas, MD    Onset Date/Surgical Date  -- 2 years ago    Hand Dominance  Right    Prior Therapy  No      Precautions   Precautions  None      Restrictions   Weight Bearing Restrictions  No      Balance Screen   Has the patient fallen in the past 6 months  No      [place] residence    Living Arrangements  Spouse/significant other      Prior Function   Level of [place]  Retired    Leisure  working out at gym, exercise classes      Cognition   Overall Cognitive Status  Within Functional Limits for tasks assessed      Observation/Other  Assessments   Focus on Therapeutic Outcomes (FOTO)   36% limited      Posture/Postural Control   Posture/Postural Control  Postural limitations    Postural Limitations  Rounded Shoulders      ROM / Strength   AROM / PROM / Strength  Strength;AROM      AROM   Overall AROM Comments  left shoulder behind the back IR T4; right IR T8      Strength   Strength Assessment Site  Shoulder    Right/Left Shoulder  Right;Left    Right Shoulder Flexion  4+/5    Right Shoulder Extension  4+/5    Right Shoulder ABduction  4+/5    Right Shoulder Internal Rotation  5/5    Right Shoulder External Rotation  4/5    Right Shoulder Horizontal ABduction  4+/5    Right Shoulder Horizontal ADduction  4/5    Left Shoulder Flexion  5/5    Left Shoulder Extension  5/5    Left Shoulder ABduction  5/5    Left Shoulder Internal Rotation  5/5    Left Shoulder External Rotation  5/5    Left Shoulder Horizontal ABduction  5/5    Left Shoulder Horizontal ADduction  5/5      Palpation   Palpation  comment  tight and tender to anterior deltoid, subclavius, pec minor, tender to atnerior humeral head      Special Tests   Other special tests  empty can, Neer, lift off - positive on right UE      Ambulation/Gait   Gait Pattern  Within Functional Limits                Objective measurements completed on examination: See above findings.              PT Education - 08/21/17 1307    Education provided  Yes    Education Details  Access Code: [employer]     Person(s) Educated  Patient    Methods  Explanation;Demonstration;Handout    Comprehension  Verbalized understanding;Returned demonstration       PT Short Term Goals - 08/21/17 1723      PT SHORT TERM GOAL #1   Title  ind with initial HEP    Time  4    Period  Weeks    Status  New    Target Date  09/18/17      PT SHORT TERM GOAL #2   Title  pt will be able to reach overhead with 50% less pain    Time  4    Period  Weeks    Status  New    Target Date  09/18/17        PT Long Term Goals - 08/21/17 1724      PT LONG TERM GOAL #1   Title  ind with advanced HEP in order to return to the gym    Time  8    Period  Weeks    Status  New    Target Date  10/16/17      PT LONG TERM GOAL #2   Title  MMT 5/5 throughout right shoulder for return to functional overhead activities such as putting things away in upper cabinets    Time  8    Period  Weeks    Status  New    Target Date  10/16/17      PT LONG TERM GOAL #3   Title  pt will report  ability to sleep on right side for at least half the night    Time  8    Period  Weeks    Status  New    Target Date  10/16/17      PT LONG TERM GOAL #4   Title  pt will report 75% reduction in pain during normal functional activities such as typing on the computer    Time  8    Period  Weeks    Status  New    Target Date  10/16/17             Plan - 08/21/17 1716    Clinical Impression Statement  Pt presents to clnic due to right shoulder pain that  has been ongoing for at least 2 years.  She reports it has recently gotten worse and can no longer sleep on her right side.  She demonstrates reduced IR ROM. She demonstrates weaknesses in right shoulder which is her dominent side as compared to the non-dominent left shoulder.  Pt tested positive for 2 anterior impingment tests.  She has reduced symptoms with scapular support when reaching overhead.  Pt has postural limitations as described above.  Pt will benefit from skilled PT to address these impairments and concerns.     Clinical Presentation  Evolving    Clinical Presentation due to:  recently worsened    Clinical Decision Making  Low    Rehab Potential  Excellent    PT Frequency  2x / week    PT Duration  8 weeks    PT Treatment/Interventions  ADLs/Self Care Home Management;Biofeedback;Cryotherapy;Electrical Stimulation;Therapeutic activities;Therapeutic exercise;Neuromuscular re-education;Patient/family education;Manual techniques;Passive range of motion;Dry needling;Taping    PT Next Visit Plan  towel stretch for IR, pec stretches on foam roll, thoracic rotation and ext, shoulder and scap stability    PT Home Exercise Plan  progress to add strengthening Access Code: [email or web address]     Consulted and Agree with Plan of Care  Patient       Patient will benefit from skilled therapeutic intervention in order to improve the following deficits and impairments:  Pain, Impaired UE functional use, Impaired flexibility, Decreased range of motion, Increased muscle spasms, Decreased strength  Visit Diagnosis: Chronic right shoulder pain  Muscle weakness (generalized)  Other muscle spasm  Stiffness of right shoulder, not elsewhere classified     Problem List Patient Active Problem List   Diagnosis Date Noted  . Appendicitis 07/29/2017  . S/P laparoscopic appendectomy 07/29/2017    Zannie Cove, PT 08/21/2017, 5:27 PM  [facility] [address], [place], [place], [address] Phone: [phone]   Fax:  [phone]  Name: PAIDEN CARAVEO MRN: [id number] Date of Birth: 11-18-1940

## 2017-08-26 ENCOUNTER — Encounter: Payer: Self-pay | Admitting: Physical Therapy

## 2017-08-26 ENCOUNTER — Ambulatory Visit: Payer: Medicare HMO | Admitting: Physical Therapy

## 2017-08-26 DIAGNOSIS — M62838 Other muscle spasm: Secondary | ICD-10-CM | POA: Diagnosis not present

## 2017-08-26 DIAGNOSIS — M6281 Muscle weakness (generalized): Secondary | ICD-10-CM

## 2017-08-26 DIAGNOSIS — G8929 Other chronic pain: Secondary | ICD-10-CM | POA: Diagnosis not present

## 2017-08-26 DIAGNOSIS — M25611 Stiffness of right shoulder, not elsewhere classified: Secondary | ICD-10-CM

## 2017-08-26 DIAGNOSIS — M25511 Pain in right shoulder: Secondary | ICD-10-CM | POA: Diagnosis not present

## 2017-08-26 NOTE — Therapy (Signed)
[facility] [address] [place], [place], [address] Phone: [phone]   Fax:  [phone]  Physical Therapy Treatment  Patient Details  Name: Shirley Woods MRN: [id number] Date of Birth: [birth year] Referring Provider: Lujean Amel, MD   Encounter Date: 08/26/2017  PT End of Session - 08/26/17 1150    Visit Number  2    Number of Visits  10    Date for PT Re-Evaluation  10/16/17    Authorization Type  aetna medicare    PT Start Time  1147    PT Stop Time  1234    PT Time Calculation (min)  47 min    Activity Tolerance  Patient tolerated treatment well    Behavior During Therapy  [facility] for tasks assessed/performed       Past Medical History:  Diagnosis Date  . 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 ABDOMINAL HYSTERECTOMY    . LAPAROSCOPIC APPENDECTOMY N/A 07/29/2017   Procedure: APPENDECTOMY LAPAROSCOPIC;  Surgeon: Georganna Skeans, MD;  Location: [facility];  Service: General;  Laterality: N/A;    There were no vitals filed for this visit.  Subjective Assessment - 08/26/17 1151    Subjective  Pt states shoulder is about the same. States she has done the exercises at the gym.  States she gets tired.  It feels tired right now but not painful.    Limitations  Lifting;Writing    Patient Stated Goals  be able to use computer and exercises without pain    Currently in Pain?  No/denies                       [facility] Adult PT Treatment/Exercise - 08/26/17 0001      Shoulder Exercises: Standing   External Rotation  Strengthening;Both;20 reps;Theraband    Theraband Level (Shoulder External Rotation)  Level 1 (Yellow)    Flexion  Strengthening;Right;20 reps;Theraband    Theraband Level (Shoulder Flexion)  Level 1 (Yellow)    Extension  Strengthening;Both;20 reps;Theraband    Theraband Level (Shoulder Extension)   Level 1 (Yellow)    Row  Strengthening;Both;20 reps;Theraband    Theraband Level (Shoulder Row)  Level 2 (Red)    Other Standing Exercises  finger ladder shoulder scaption with cues for stabilizing scap and keeping shoulder down and back - 10x to 13    Other Standing Exercises  shoulder stability up and down, side to side - 10x each      Shoulder Exercises: ROM/Strengthening   UBE (Upper Arm Bike)  L0 3x 3 fwd/back PT present to discuss status and plan    Wall Wash  flexion with lift off - 10x      Manual Therapy   Manual Therapy  Soft tissue mobilization    Soft tissue mobilization  right pecs major and minor, subclavius             PT Education - 08/26/17 1240    Education provided  Yes    Education Details  [employer]     Person(s) Educated  Patient    Methods  Explanation;Demonstration;Verbal cues;Tactile cues;Handout    Comprehension  Verbalized understanding;Returned demonstration       PT Short Term Goals - 08/21/17 1723      PT SHORT TERM GOAL #1   Title  ind with initial HEP    Time  4  Period  Weeks    Status  New    Target Date  09/18/17      PT SHORT TERM GOAL #2   Title  pt will be able to reach overhead with 50% less pain    Time  4    Period  Weeks    Status  New    Target Date  09/18/17        PT Long Term Goals - 08/21/17 1724      PT LONG TERM GOAL #1   Title  ind with advanced HEP in order to return to the gym    Time  8    Period  Weeks    Status  New    Target Date  10/16/17      PT LONG TERM GOAL #2   Title  MMT 5/5 throughout right shoulder for return to functional overhead activities such as putting things away in upper cabinets    Time  8    Period  Weeks    Status  New    Target Date  10/16/17      PT LONG TERM GOAL #3   Title  pt will report ability to sleep on right side for at least half the night    Time  8    Period  Weeks    Status  New    Target Date  10/16/17      PT LONG TERM GOAL #4   Title  pt will report  75% reduction in pain during normal functional activities such as typing on the computer    Time  8    Period  Weeks    Status  New    Target Date  10/16/17            Plan - 08/26/17 1243    Clinical Impression Statement  Pt did well with exercises.  She needs a lot of cues to stabilize scapula for preventing increased shoulder elevation and protraction.  Pt has rotation to the left in mid-low thoracic spine creating more forward posture of right shoulder.  Pt will benefit from skilled PT to work on posutre, ROM and strength of shoulder and scapula stability    PT Treatment/Interventions  ADLs/Self Care Home Management;Biofeedback;Cryotherapy;Electrical Stimulation;Therapeutic activities;Therapeutic exercise;Neuromuscular re-education;Patient/family education;Manual techniques;Passive range of motion;Dry needling;Taping    PT Next Visit Plan  towel stretch for IR, pec stretches on foam roll, thoracic rotation and ext, shoulder and scap stability    PT Home Exercise Plan  progress to add strengthening Access Code: [email or web address]     Consulted and Agree with Plan of Care  Patient       Patient will benefit from skilled therapeutic intervention in order to improve the following deficits and impairments:  Pain, Impaired UE functional use, Impaired flexibility, Decreased range of motion, Increased muscle spasms, Decreased strength  Visit Diagnosis: Chronic right shoulder pain  Muscle weakness (generalized)  Other muscle spasm  Stiffness of right shoulder, not elsewhere classified     Problem List Patient Active Problem List   Diagnosis Date Noted  . Appendicitis 07/29/2017  . S/P laparoscopic appendectomy 07/29/2017    Zannie Cove, PT 08/26/2017, 12:54 PM  [facility] [address], [place], [place], [address] Phone: [phone]   Fax:  [phone]  Name: Shirley Woods MRN: [id number] Date of Birth:  03/08/1941

## 2017-08-26 NOTE — Patient Instructions (Signed)
Access Code: [email or web address]  URL: [email or web address]  Date: 08/26/2017  Prepared by: Lovett Calender   Exercises  Open Book Chest Stretch on Towel Roll - 5 reps - 1 sets - 10 sec hold - 2x daily - 7x weekly  Single Arm Doorway Pec Stretch at 60 Elevation - 3 reps - 1 sets - 30 sec hold - 2x daily - 7x weekly  Standing Shoulder Row with Anchored Resistance - 10 reps - 3 sets - 1x daily - 7x weekly  Single Arm Shoulder Extension with Anchored Resistance - 10 reps - 3 sets - 1x daily - 7x weekly  Shoulder External Rotation and Scapular Retraction with Resistance - 10 reps - 3 sets - 1x daily - 7x weekly

## 2017-09-04 ENCOUNTER — Encounter: Payer: Self-pay | Admitting: Physical Therapy

## 2017-09-04 ENCOUNTER — Ambulatory Visit: Payer: Medicare HMO | Admitting: Physical Therapy

## 2017-09-04 DIAGNOSIS — M62838 Other muscle spasm: Secondary | ICD-10-CM | POA: Diagnosis not present

## 2017-09-04 DIAGNOSIS — G8929 Other chronic pain: Secondary | ICD-10-CM

## 2017-09-04 DIAGNOSIS — M25611 Stiffness of right shoulder, not elsewhere classified: Secondary | ICD-10-CM

## 2017-09-04 DIAGNOSIS — M25511 Pain in right shoulder: Principal | ICD-10-CM

## 2017-09-04 DIAGNOSIS — M6281 Muscle weakness (generalized): Secondary | ICD-10-CM | POA: Diagnosis not present

## 2017-09-04 NOTE — Therapy (Addendum)
[facility] [address], [place], [place], [address] Phone: [phone]   Fax:  [phone]  Physical Therapy Treatment  Patient Details  Name: Shirley Woods MRN: [id number] Date of Birth: 1941/04/25 Referring Provider: Lujean Amel, MD   Encounter Date: 09/04/2017  PT End of Session - 09/04/17 1238    Visit Number  3    Number of Visits  10    Date for PT Re-Evaluation  10/16/17    Authorization Type  aetna medicare    PT Start Time  1231    PT Stop Time  1315    PT Time Calculation (min)  44 min    Activity Tolerance  Patient tolerated treatment well    Behavior During Therapy  [facility] for tasks assessed/performed       Past Medical History:  Diagnosis Date  . 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 ABDOMINAL HYSTERECTOMY    . LAPAROSCOPIC APPENDECTOMY N/A 07/29/2017   Procedure: APPENDECTOMY LAPAROSCOPIC;  Surgeon: Georganna Skeans, MD;  Location: [facility];  Service: General;  Laterality: N/A;    There were no vitals filed for this visit.  Subjective Assessment - 09/04/17 1239    Subjective  Pt states her shoulder has been feeling better.  She has been doing the exercises at the gym.    Patient Stated Goals  be able to use computer and exercises without pain    Currently in Pain?  No/denies                       [facility] Adult PT Treatment/Exercise - 09/04/17 0001      Shoulder Exercises: Supine   Horizontal ABduction  Strengthening;Both;20 reps;Theraband    Theraband Level (Shoulder Horizontal ABduction)  Level 1 (Yellow)    External Rotation  Strengthening;20 reps;Theraband;Both    Theraband Level (Shoulder External Rotation)  Level 1 (Yellow)    Flexion  Strengthening;Both;20 reps;Theraband    Theraband Level (Shoulder Flexion)  Level 1 (Yellow)    Diagonals  Strengthening;Both;20 reps;Theraband    Theraband Level (Shoulder Diagonals)  Level 1 (Yellow) D2 yellow band - 20x each way      Shoulder Exercises: Prone   Other Prone Exercises  on ball - W's, Ts      Shoulder Exercises: ROM/Strengthening   UBE (Upper Arm Bike)  L1 3x 3 fwd/back PT present to discuss status and plan    Other ROM/Strengthening Exercises  corner stretch - 3 x 20 sec      Shoulder Exercises: Power Development worker, community  20 reps 15 lb    Row  20 reps 15 lb    External Rotation  20 reps;Limitations one plate    External Rotation Limitations  cues to keep wrist straight and min A to guide movement               PT Short Term Goals - 08/21/17 1723      PT SHORT TERM GOAL #1   Title  ind with initial HEP    Time  4    Period  Weeks    Status  New    Target Date  09/18/17      PT SHORT TERM GOAL #2   Title  pt will be able to reach overhead with 50% less pain    Time  4    Period  Weeks  Status  New    Target Date  09/18/17        PT Long Term Goals - 08/21/17 1724      PT LONG TERM GOAL #1   Title  ind with advanced HEP in order to return to the gym    Time  8    Period  Weeks    Status  New    Target Date  10/16/17      PT LONG TERM GOAL #2   Title  MMT 5/5 throughout right shoulder for return to functional overhead activities such as putting things away in upper cabinets    Time  8    Period  Weeks    Status  New    Target Date  10/16/17      PT LONG TERM GOAL #3   Title  pt will report ability to sleep on right side for at least half the night    Time  8    Period  Weeks    Status  New    Target Date  10/16/17      PT LONG TERM GOAL #4   Title  pt will report 75% reduction in pain during normal functional activities such as typing on the computer    Time  8    Period  Weeks    Status  New    Target Date  10/16/17            Plan - 09/04/17 1358    Clinical Impression Statement  Pt has been able to sleep without noticeable pain and notices her arm feels stronger  when doing exercises at the gym.  Pt needs cues fo rposture during exercises.  she is able to increase reisistance and difficulty.  Pt will benefit from skilled PT to continue working on improved strength and ROM for funcitonal reaching activities.    PT Treatment/Interventions  ADLs/Self Care Home Management;Biofeedback;Cryotherapy;Electrical Stimulation;Therapeutic activities;Therapeutic exercise;Neuromuscular re-education;Patient/family education;Manual techniques;Passive range of motion;Dry needling;Taping    PT Next Visit Plan  progress strength as tolerated, pec stretches on foam roll, thoracic rotation and ext, shoulder and scap stability    PT Home Exercise Plan  progress to add strengthening Access Code: [email or web address]     Consulted and Agree with Plan of Care  Patient       Patient will benefit from skilled therapeutic intervention in order to improve the following deficits and impairments:  Pain, Impaired UE functional use, Impaired flexibility, Decreased range of motion, Increased muscle spasms, Decreased strength  Visit Diagnosis: Chronic right shoulder pain  Muscle weakness (generalized)  Other muscle spasm  Stiffness of right shoulder, not elsewhere classified     Problem List Patient Active Problem List   Diagnosis Date Noted  . Appendicitis 07/29/2017  . S/P laparoscopic appendectomy 07/29/2017    Zannie Cove, PT 09/04/2017, 2:00 PM  [facility] [address], [place], [place], [address] Phone: [phone]   Fax:  [phone]  Name: Shirley Woods MRN: [id number] Date of Birth: 1941/03/16  PHYSICAL THERAPY DISCHARGE SUMMARY  Visits from Start of Care: 3  Current functional level related to goals / functional outcomes: See above   Remaining deficits: See above   Education / Equipment: HEP  Plan: Patient agrees to discharge.  Patient goals were not met. Patient is being discharged due to not  returning since the last visit.  ?????    Communicated with patient via email and she was happy  with result from PT  [facility], PT 10/07/17 8:21 AM

## 2017-09-11 ENCOUNTER — Encounter: Payer: Medicare HMO | Admitting: Physical Therapy

## 2017-09-18 ENCOUNTER — Encounter: Payer: Medicare HMO | Admitting: Physical Therapy

## 2018-01-22 DIAGNOSIS — R69 Illness, unspecified: Secondary | ICD-10-CM | POA: Diagnosis not present

## 2018-01-24 DIAGNOSIS — R69 Illness, unspecified: Secondary | ICD-10-CM | POA: Diagnosis not present

## 2018-03-31 DIAGNOSIS — Z79899 Other long term (current) drug therapy: Secondary | ICD-10-CM | POA: Diagnosis not present

## 2018-03-31 DIAGNOSIS — R413 Other amnesia: Secondary | ICD-10-CM | POA: Diagnosis not present

## 2018-03-31 DIAGNOSIS — M810 Age-related osteoporosis without current pathological fracture: Secondary | ICD-10-CM | POA: Diagnosis not present

## 2018-03-31 DIAGNOSIS — R69 Illness, unspecified: Secondary | ICD-10-CM | POA: Diagnosis not present

## 2018-03-31 DIAGNOSIS — Z136 Encounter for screening for cardiovascular disorders: Secondary | ICD-10-CM | POA: Diagnosis not present

## 2018-03-31 DIAGNOSIS — Z0001 Encounter for general adult medical examination with abnormal findings: Secondary | ICD-10-CM | POA: Diagnosis not present

## 2018-04-08 ENCOUNTER — Other Ambulatory Visit: Payer: Self-pay | Admitting: Family Medicine

## 2018-04-08 DIAGNOSIS — M810 Age-related osteoporosis without current pathological fracture: Secondary | ICD-10-CM

## 2018-06-08 ENCOUNTER — Other Ambulatory Visit: Payer: Medicare HMO

## 2018-07-27 ENCOUNTER — Other Ambulatory Visit: Payer: Self-pay

## 2018-07-27 ENCOUNTER — Ambulatory Visit
Admission: RE | Admit: 2018-07-27 | Discharge: 2018-07-27 | Disposition: A | Discharge status: Home / Self Care | Payer: Medicare HMO | Source: Ambulatory Visit | Attending: Family Medicine | Admitting: Family Medicine

## 2018-07-27 DIAGNOSIS — M8589 Other specified disorders of bone density and structure, multiple sites: Secondary | ICD-10-CM | POA: Diagnosis not present

## 2018-07-27 DIAGNOSIS — M810 Age-related osteoporosis without current pathological fracture: Secondary | ICD-10-CM

## 2018-09-07 DIAGNOSIS — L03113 Cellulitis of right upper limb: Secondary | ICD-10-CM | POA: Diagnosis not present

## 2018-09-24 DIAGNOSIS — R69 Illness, unspecified: Secondary | ICD-10-CM | POA: Diagnosis not present

## 2019-01-20 DIAGNOSIS — R69 Illness, unspecified: Secondary | ICD-10-CM | POA: Diagnosis not present

## 2019-03-04 DIAGNOSIS — R69 Illness, unspecified: Secondary | ICD-10-CM | POA: Diagnosis not present

## 2019-03-04 DIAGNOSIS — M8580 Other specified disorders of bone density and structure, unspecified site: Secondary | ICD-10-CM | POA: Diagnosis not present

## 2019-03-04 DIAGNOSIS — Z1331 Encounter for screening for depression: Secondary | ICD-10-CM | POA: Diagnosis not present

## 2019-03-04 DIAGNOSIS — Z0000 Encounter for general adult medical examination without abnormal findings: Secondary | ICD-10-CM | POA: Diagnosis not present

## 2019-03-24 DIAGNOSIS — R69 Illness, unspecified: Secondary | ICD-10-CM | POA: Diagnosis not present

## 2019-04-06 DIAGNOSIS — I10 Essential (primary) hypertension: Secondary | ICD-10-CM | POA: Diagnosis not present

## 2019-04-06 DIAGNOSIS — M25511 Pain in right shoulder: Secondary | ICD-10-CM | POA: Diagnosis not present

## 2019-04-07 DIAGNOSIS — M19011 Primary osteoarthritis, right shoulder: Secondary | ICD-10-CM | POA: Diagnosis not present

## 2019-04-24 DIAGNOSIS — F419 Anxiety disorder, unspecified: Secondary | ICD-10-CM | POA: Diagnosis not present

## 2019-04-24 DIAGNOSIS — G8929 Other chronic pain: Secondary | ICD-10-CM | POA: Diagnosis not present

## 2019-04-24 DIAGNOSIS — R32 Unspecified urinary incontinence: Secondary | ICD-10-CM | POA: Diagnosis not present

## 2019-04-24 DIAGNOSIS — R69 Illness, unspecified: Secondary | ICD-10-CM | POA: Diagnosis not present

## 2019-04-24 DIAGNOSIS — M1990 Unspecified osteoarthritis, unspecified site: Secondary | ICD-10-CM | POA: Diagnosis not present

## 2019-04-24 DIAGNOSIS — Z803 Family history of malignant neoplasm of breast: Secondary | ICD-10-CM | POA: Diagnosis not present

## 2019-04-24 DIAGNOSIS — F0390 Unspecified dementia without behavioral disturbance: Secondary | ICD-10-CM | POA: Diagnosis not present

## 2019-04-24 DIAGNOSIS — I10 Essential (primary) hypertension: Secondary | ICD-10-CM | POA: Diagnosis not present

## 2019-04-24 DIAGNOSIS — K5900 Constipation, unspecified: Secondary | ICD-10-CM | POA: Diagnosis not present

## 2019-04-24 DIAGNOSIS — H04129 Dry eye syndrome of unspecified lacrimal gland: Secondary | ICD-10-CM | POA: Diagnosis not present

## 2019-04-30 DIAGNOSIS — H04122 Dry eye syndrome of left lacrimal gland: Secondary | ICD-10-CM | POA: Diagnosis not present

## 2019-04-30 DIAGNOSIS — H04121 Dry eye syndrome of right lacrimal gland: Secondary | ICD-10-CM | POA: Diagnosis not present

## 2019-04-30 DIAGNOSIS — H5201 Hypermetropia, right eye: Secondary | ICD-10-CM | POA: Diagnosis not present

## 2019-05-19 DIAGNOSIS — M19011 Primary osteoarthritis, right shoulder: Secondary | ICD-10-CM | POA: Diagnosis not present

## 2019-05-25 DIAGNOSIS — H6123 Impacted cerumen, bilateral: Secondary | ICD-10-CM | POA: Diagnosis not present

## 2019-09-09 DIAGNOSIS — M85852 Other specified disorders of bone density and structure, left thigh: Secondary | ICD-10-CM | POA: Diagnosis not present

## 2019-09-09 DIAGNOSIS — Z0000 Encounter for general adult medical examination without abnormal findings: Secondary | ICD-10-CM | POA: Diagnosis not present

## 2019-09-09 DIAGNOSIS — I10 Essential (primary) hypertension: Secondary | ICD-10-CM | POA: Diagnosis not present

## 2019-09-09 DIAGNOSIS — Z1389 Encounter for screening for other disorder: Secondary | ICD-10-CM | POA: Diagnosis not present

## 2019-09-09 DIAGNOSIS — Z23 Encounter for immunization: Secondary | ICD-10-CM | POA: Diagnosis not present

## 2019-09-09 DIAGNOSIS — Z79899 Other long term (current) drug therapy: Secondary | ICD-10-CM | POA: Diagnosis not present

## 2019-09-09 DIAGNOSIS — M85851 Other specified disorders of bone density and structure, right thigh: Secondary | ICD-10-CM | POA: Diagnosis not present

## 2019-09-09 DIAGNOSIS — E041 Nontoxic single thyroid nodule: Secondary | ICD-10-CM | POA: Diagnosis not present

## 2019-09-17 DIAGNOSIS — E041 Nontoxic single thyroid nodule: Secondary | ICD-10-CM | POA: Diagnosis not present

## 2019-09-17 DIAGNOSIS — E042 Nontoxic multinodular goiter: Secondary | ICD-10-CM | POA: Diagnosis not present

## 2019-10-20 DIAGNOSIS — R69 Illness, unspecified: Secondary | ICD-10-CM | POA: Diagnosis not present

## 2019-11-06 DIAGNOSIS — Z803 Family history of malignant neoplasm of breast: Secondary | ICD-10-CM | POA: Diagnosis not present

## 2019-11-06 DIAGNOSIS — Z8249 Family history of ischemic heart disease and other diseases of the circulatory system: Secondary | ICD-10-CM | POA: Diagnosis not present

## 2019-11-06 DIAGNOSIS — R69 Illness, unspecified: Secondary | ICD-10-CM | POA: Diagnosis not present

## 2019-11-06 DIAGNOSIS — M1990 Unspecified osteoarthritis, unspecified site: Secondary | ICD-10-CM | POA: Diagnosis not present

## 2019-11-06 DIAGNOSIS — I10 Essential (primary) hypertension: Secondary | ICD-10-CM | POA: Diagnosis not present

## 2019-12-13 DIAGNOSIS — M5432 Sciatica, left side: Secondary | ICD-10-CM | POA: Diagnosis not present

## 2019-12-13 DIAGNOSIS — M9903 Segmental and somatic dysfunction of lumbar region: Secondary | ICD-10-CM | POA: Diagnosis not present

## 2019-12-15 DIAGNOSIS — M9903 Segmental and somatic dysfunction of lumbar region: Secondary | ICD-10-CM | POA: Diagnosis not present

## 2019-12-15 DIAGNOSIS — M5432 Sciatica, left side: Secondary | ICD-10-CM | POA: Diagnosis not present

## 2019-12-20 DIAGNOSIS — M9903 Segmental and somatic dysfunction of lumbar region: Secondary | ICD-10-CM | POA: Diagnosis not present

## 2019-12-20 DIAGNOSIS — M5432 Sciatica, left side: Secondary | ICD-10-CM | POA: Diagnosis not present

## 2019-12-23 DIAGNOSIS — M9903 Segmental and somatic dysfunction of lumbar region: Secondary | ICD-10-CM | POA: Diagnosis not present

## 2019-12-23 DIAGNOSIS — M5432 Sciatica, left side: Secondary | ICD-10-CM | POA: Diagnosis not present

## 2019-12-28 DIAGNOSIS — M9903 Segmental and somatic dysfunction of lumbar region: Secondary | ICD-10-CM | POA: Diagnosis not present

## 2019-12-28 DIAGNOSIS — M5432 Sciatica, left side: Secondary | ICD-10-CM | POA: Diagnosis not present

## 2019-12-30 DIAGNOSIS — M5432 Sciatica, left side: Secondary | ICD-10-CM | POA: Diagnosis not present

## 2019-12-30 DIAGNOSIS — M9903 Segmental and somatic dysfunction of lumbar region: Secondary | ICD-10-CM | POA: Diagnosis not present

## 2020-01-04 DIAGNOSIS — M5432 Sciatica, left side: Secondary | ICD-10-CM | POA: Diagnosis not present

## 2020-01-04 DIAGNOSIS — M9903 Segmental and somatic dysfunction of lumbar region: Secondary | ICD-10-CM | POA: Diagnosis not present

## 2020-01-06 DIAGNOSIS — M9903 Segmental and somatic dysfunction of lumbar region: Secondary | ICD-10-CM | POA: Diagnosis not present

## 2020-01-06 DIAGNOSIS — M5432 Sciatica, left side: Secondary | ICD-10-CM | POA: Diagnosis not present

## 2020-01-11 DIAGNOSIS — M9903 Segmental and somatic dysfunction of lumbar region: Secondary | ICD-10-CM | POA: Diagnosis not present

## 2020-01-11 DIAGNOSIS — M5432 Sciatica, left side: Secondary | ICD-10-CM | POA: Diagnosis not present

## 2020-01-25 DIAGNOSIS — M9903 Segmental and somatic dysfunction of lumbar region: Secondary | ICD-10-CM | POA: Diagnosis not present

## 2020-01-25 DIAGNOSIS — Z23 Encounter for immunization: Secondary | ICD-10-CM | POA: Diagnosis not present

## 2020-01-25 DIAGNOSIS — M5432 Sciatica, left side: Secondary | ICD-10-CM | POA: Diagnosis not present

## 2020-02-01 DIAGNOSIS — M5432 Sciatica, left side: Secondary | ICD-10-CM | POA: Diagnosis not present

## 2020-02-01 DIAGNOSIS — M9903 Segmental and somatic dysfunction of lumbar region: Secondary | ICD-10-CM | POA: Diagnosis not present

## 2020-02-07 DIAGNOSIS — M25531 Pain in right wrist: Secondary | ICD-10-CM | POA: Diagnosis not present

## 2020-02-07 DIAGNOSIS — S60811A Abrasion of right wrist, initial encounter: Secondary | ICD-10-CM | POA: Diagnosis not present

## 2020-02-07 DIAGNOSIS — S52501A Unspecified fracture of the lower end of right radius, initial encounter for closed fracture: Secondary | ICD-10-CM | POA: Diagnosis not present

## 2020-02-09 DIAGNOSIS — S6291XA Unspecified fracture of right wrist and hand, initial encounter for closed fracture: Secondary | ICD-10-CM | POA: Diagnosis not present

## 2020-02-10 DIAGNOSIS — X58XXXA Exposure to other specified factors, initial encounter: Secondary | ICD-10-CM | POA: Diagnosis not present

## 2020-02-10 DIAGNOSIS — S52531A Colles' fracture of right radius, initial encounter for closed fracture: Secondary | ICD-10-CM | POA: Diagnosis not present

## 2020-02-22 DIAGNOSIS — I10 Essential (primary) hypertension: Secondary | ICD-10-CM | POA: Diagnosis not present

## 2020-02-22 DIAGNOSIS — Z23 Encounter for immunization: Secondary | ICD-10-CM | POA: Diagnosis not present

## 2020-02-25 DIAGNOSIS — S6291XA Unspecified fracture of right wrist and hand, initial encounter for closed fracture: Secondary | ICD-10-CM | POA: Diagnosis not present

## 2020-02-25 DIAGNOSIS — M25631 Stiffness of right wrist, not elsewhere classified: Secondary | ICD-10-CM | POA: Diagnosis not present

## 2020-02-25 DIAGNOSIS — M25639 Stiffness of unspecified wrist, not elsewhere classified: Secondary | ICD-10-CM | POA: Diagnosis not present

## 2020-02-25 DIAGNOSIS — M25641 Stiffness of right hand, not elsewhere classified: Secondary | ICD-10-CM | POA: Diagnosis not present

## 2020-02-25 DIAGNOSIS — Z4789 Encounter for other orthopedic aftercare: Secondary | ICD-10-CM | POA: Diagnosis not present

## 2020-03-02 DIAGNOSIS — M25631 Stiffness of right wrist, not elsewhere classified: Secondary | ICD-10-CM | POA: Diagnosis not present

## 2020-03-09 DIAGNOSIS — M25631 Stiffness of right wrist, not elsewhere classified: Secondary | ICD-10-CM | POA: Diagnosis not present

## 2020-03-16 DIAGNOSIS — M25631 Stiffness of right wrist, not elsewhere classified: Secondary | ICD-10-CM | POA: Diagnosis not present

## 2020-03-21 DIAGNOSIS — S6291XA Unspecified fracture of right wrist and hand, initial encounter for closed fracture: Secondary | ICD-10-CM | POA: Diagnosis not present

## 2020-03-21 DIAGNOSIS — Z4789 Encounter for other orthopedic aftercare: Secondary | ICD-10-CM | POA: Diagnosis not present

## 2020-03-21 DIAGNOSIS — M25641 Stiffness of right hand, not elsewhere classified: Secondary | ICD-10-CM | POA: Diagnosis not present

## 2020-03-30 DIAGNOSIS — M25641 Stiffness of right hand, not elsewhere classified: Secondary | ICD-10-CM | POA: Diagnosis not present

## 2020-03-30 DIAGNOSIS — M25631 Stiffness of right wrist, not elsewhere classified: Secondary | ICD-10-CM | POA: Diagnosis not present

## 2020-04-13 DIAGNOSIS — M25641 Stiffness of right hand, not elsewhere classified: Secondary | ICD-10-CM | POA: Diagnosis not present

## 2020-04-13 DIAGNOSIS — M25631 Stiffness of right wrist, not elsewhere classified: Secondary | ICD-10-CM | POA: Diagnosis not present

## 2020-04-14 ENCOUNTER — Ambulatory Visit
Admission: RE | Admit: 2020-04-14 | Discharge: 2020-04-14 | Disposition: A | Discharge status: Home / Self Care | Payer: Medicare HMO | Source: Ambulatory Visit | Attending: Physician Assistant | Admitting: Physician Assistant

## 2020-04-14 ENCOUNTER — Other Ambulatory Visit: Payer: Self-pay | Admitting: Physician Assistant

## 2020-04-14 DIAGNOSIS — S8992XA Unspecified injury of left lower leg, initial encounter: Secondary | ICD-10-CM

## 2020-04-20 DIAGNOSIS — M25641 Stiffness of right hand, not elsewhere classified: Secondary | ICD-10-CM | POA: Diagnosis not present

## 2020-04-27 DIAGNOSIS — R69 Illness, unspecified: Secondary | ICD-10-CM | POA: Diagnosis not present

## 2020-04-28 DIAGNOSIS — M19011 Primary osteoarthritis, right shoulder: Secondary | ICD-10-CM | POA: Diagnosis not present

## 2020-05-23 DIAGNOSIS — S6291XA Unspecified fracture of right wrist and hand, initial encounter for closed fracture: Secondary | ICD-10-CM | POA: Diagnosis not present

## 2020-08-02 DIAGNOSIS — M810 Age-related osteoporosis without current pathological fracture: Secondary | ICD-10-CM | POA: Diagnosis not present

## 2020-08-02 DIAGNOSIS — Z7722 Contact with and (suspected) exposure to environmental tobacco smoke (acute) (chronic): Secondary | ICD-10-CM | POA: Diagnosis not present

## 2020-08-02 DIAGNOSIS — I10 Essential (primary) hypertension: Secondary | ICD-10-CM | POA: Diagnosis not present

## 2020-08-02 DIAGNOSIS — F411 Generalized anxiety disorder: Secondary | ICD-10-CM | POA: Diagnosis not present

## 2020-08-02 DIAGNOSIS — G4700 Insomnia, unspecified: Secondary | ICD-10-CM | POA: Diagnosis not present

## 2020-08-02 DIAGNOSIS — M1990 Unspecified osteoarthritis, unspecified site: Secondary | ICD-10-CM | POA: Diagnosis not present

## 2020-08-02 DIAGNOSIS — R69 Illness, unspecified: Secondary | ICD-10-CM | POA: Diagnosis not present

## 2020-08-02 DIAGNOSIS — Z791 Long term (current) use of non-steroidal anti-inflammatories (NSAID): Secondary | ICD-10-CM | POA: Diagnosis not present

## 2020-08-02 DIAGNOSIS — G8929 Other chronic pain: Secondary | ICD-10-CM | POA: Diagnosis not present

## 2020-08-02 DIAGNOSIS — E785 Hyperlipidemia, unspecified: Secondary | ICD-10-CM | POA: Diagnosis not present

## 2020-09-12 DIAGNOSIS — M85852 Other specified disorders of bone density and structure, left thigh: Secondary | ICD-10-CM | POA: Diagnosis not present

## 2020-09-12 DIAGNOSIS — M25511 Pain in right shoulder: Secondary | ICD-10-CM | POA: Diagnosis not present

## 2020-09-12 DIAGNOSIS — N951 Menopausal and female climacteric states: Secondary | ICD-10-CM | POA: Diagnosis not present

## 2020-09-12 DIAGNOSIS — R69 Illness, unspecified: Secondary | ICD-10-CM | POA: Diagnosis not present

## 2020-09-12 DIAGNOSIS — Z1389 Encounter for screening for other disorder: Secondary | ICD-10-CM | POA: Diagnosis not present

## 2020-09-12 DIAGNOSIS — E042 Nontoxic multinodular goiter: Secondary | ICD-10-CM | POA: Diagnosis not present

## 2020-09-12 DIAGNOSIS — I10 Essential (primary) hypertension: Secondary | ICD-10-CM | POA: Diagnosis not present

## 2020-09-12 DIAGNOSIS — F411 Generalized anxiety disorder: Secondary | ICD-10-CM | POA: Diagnosis not present

## 2020-09-12 DIAGNOSIS — Z0000 Encounter for general adult medical examination without abnormal findings: Secondary | ICD-10-CM | POA: Diagnosis not present

## 2020-09-12 DIAGNOSIS — Z79899 Other long term (current) drug therapy: Secondary | ICD-10-CM | POA: Diagnosis not present

## 2020-09-15 DIAGNOSIS — M19011 Primary osteoarthritis, right shoulder: Secondary | ICD-10-CM | POA: Diagnosis not present

## 2020-09-15 DIAGNOSIS — E042 Nontoxic multinodular goiter: Secondary | ICD-10-CM | POA: Diagnosis not present

## 2020-09-22 DIAGNOSIS — H04121 Dry eye syndrome of right lacrimal gland: Secondary | ICD-10-CM | POA: Diagnosis not present

## 2020-09-22 DIAGNOSIS — H04123 Dry eye syndrome of bilateral lacrimal glands: Secondary | ICD-10-CM | POA: Diagnosis not present

## 2020-09-22 DIAGNOSIS — H5201 Hypermetropia, right eye: Secondary | ICD-10-CM | POA: Diagnosis not present

## 2020-09-22 DIAGNOSIS — H5212 Myopia, left eye: Secondary | ICD-10-CM | POA: Diagnosis not present

## 2020-10-27 ENCOUNTER — Other Ambulatory Visit: Payer: Self-pay | Admitting: Orthopedic Surgery

## 2020-10-27 DIAGNOSIS — M25511 Pain in right shoulder: Secondary | ICD-10-CM

## 2020-10-27 DIAGNOSIS — M19011 Primary osteoarthritis, right shoulder: Secondary | ICD-10-CM | POA: Diagnosis not present

## 2020-11-04 ENCOUNTER — Ambulatory Visit
Admission: RE | Admit: 2020-11-04 | Discharge: 2020-11-04 | Disposition: A | Discharge status: Home / Self Care | Payer: Medicare HMO | Source: Ambulatory Visit | Attending: Orthopedic Surgery | Admitting: Orthopedic Surgery

## 2020-11-04 ENCOUNTER — Other Ambulatory Visit: Payer: Self-pay

## 2020-11-04 DIAGNOSIS — M75121 Complete rotator cuff tear or rupture of right shoulder, not specified as traumatic: Secondary | ICD-10-CM | POA: Diagnosis not present

## 2020-11-04 DIAGNOSIS — M19011 Primary osteoarthritis, right shoulder: Secondary | ICD-10-CM | POA: Diagnosis not present

## 2020-11-04 DIAGNOSIS — M25511 Pain in right shoulder: Secondary | ICD-10-CM

## 2020-11-04 DIAGNOSIS — M25411 Effusion, right shoulder: Secondary | ICD-10-CM | POA: Diagnosis not present

## 2020-11-04 DIAGNOSIS — Z01818 Encounter for other preprocedural examination: Secondary | ICD-10-CM | POA: Diagnosis not present

## 2020-11-06 DIAGNOSIS — M19011 Primary osteoarthritis, right shoulder: Secondary | ICD-10-CM | POA: Diagnosis not present

## 2020-11-14 NOTE — Progress Notes (Addendum)
PCP - Lajean Manes, MD Cardiologist - no  PPM/ICD -  Device Orders -  Rep Notified -   Chest x-ray -  EKG - 11-15-20 Stress Test -  ECHO -  Cardiac Cath -   Sleep Study -  CPAP -   Fasting Blood Sugar -  Checks Blood Sugar _____ times a day  Blood Thinner Instructions: Aspirin Instructions:  ERAS Protcol - PRE-SURGERY Ensure or G2-   COVID TEST- 7-8 Activity--Able to walk a flight of stairs without SOB  Anesthesia review: HTN, short term memory loss  Patient denies shortness of breath, fever, cough and chest pain at Shirley Woods appointment   All instructions explained to the patient, with a verbal understanding of the material. Patient agrees to go over the instructions while at home for a better understanding. Patient also instructed to self quarantine after being tested for COVID-19. The opportunity to ask questions was provided.

## 2020-11-14 NOTE — Patient Instructions (Signed)
DUE TO COVID-19 ONLY ONE VISITOR IS ALLOWED TO COME WITH YOU AND STAY IN THE WAITING ROOM ONLY DURING PRE OP AND PROCEDURE DAY OF SURGERY.   TWO VISITOR  MAY VISIT WITH YOU AFTER SURGERY IN YOUR PRIVATE ROOM DURING VISITING HOURS ONLY!  YOU NEED TO HAVE A COVID 19 TEST ON___7-8-22____ @_______ , THIS TEST MUST BE DONE BEFORE SURGERY,    COVID TESTING SITE [address] [place] [address],   IT IS ON THE RIGHT GOING OUT [address] APPROXIMATELY  2 MINUTES PAST [employer] ON THE RIGHT. ONCE YOUR COVID TEST IS COMPLETED,  PLEASE Wear a mask when in public           Your procedure is scheduled on: 11-21-20   Report to [facility] Main  Entrance   Report to admitting at        1120  AM     Call this number if you have problems the morning of surgery [phone]    Remember: NO SOLID FOOD AFTER MIDNIGHT THE NIGHT PRIOR TO SURGERY. NOTHING BY MOUTH EXCEPT CLEAR LIQUIDS UNTIL        1050 am    . PLEASE FINISH ENSURE DRINK PER SURGEON ORDER  WHICH NEEDS TO BE COMPLETED AT      1050 am then nothing by mouth.     BRUSH YOUR TEETH MORNING OF SURGERY AND RINSE YOUR MOUTH OUT, NO CHEWING GUM CANDY OR MINTS.     Take these medicines the morning of surgery with A SIP OF WATER: zoloft, amlodipine                                 You may not have any metal on your body including hair pins and              piercings  Do not wear jewelry, make-up, lotions, powders or perfumes, deodorant             Do not wear nail polish on your fingernails or toenails .  Do not shave  48 hours prior to surgery.               Do not bring valuables to the hospital. [place].  Contacts, dentures or bridgework may not be worn into surgery.       Patients discharged the day of surgery will not be allowed to drive home. IF YOU ARE HAVING SURGERY AND GOING HOME THE SAME DAY, YOU MUST HAVE AN ADULT TO DRIVE YOU HOME AND BE WITH YOU FOR  24 HOURS. YOU MAY GO HOME BY TAXI OR UBER OR ORTHERWISE, BUT AN ADULT MUST ACCOMPANY YOU HOME AND STAY WITH YOU FOR 24 HOURS.  Name and phone number of your driver:  Special Instructions: N/A              Please read over the following fact sheets you were given: _____________________________________________________________________ [facility]- Preparing for Total Shoulder Arthroplasty    Before surgery, you can play an important role. Because skin is not sterile, your skin needs to be as free of germs as possible. You can reduce the number of germs on your skin by using the following products. Benzoyl Peroxide Gel Reduces the number of germs present on the skin Applied twice a day to shoulder area  starting two days before surgery    ==================================================================  Please follow these instructions carefully:  BENZOYL PEROXIDE 5% GEL  Please do not use if you have an allergy to benzoyl peroxide.   If your skin becomes reddened/irritated stop using the benzoyl peroxide.  Starting two days before surgery, apply as follows: Apply benzoyl peroxide in the morning and at night. Apply after taking a shower. If you are not taking a shower clean entire shoulder front, back, and side along with the armpit with a clean wet washcloth.  Place a quarter-sized dollop on your shoulder and rub in thoroughly, making sure to cover the front, back, and side of your shoulder, along with the armpit.   2 days before ____ AM   ____ PM              1 day before ____ AM   ____ PM                         Do this twice a day for two days.  (Last application is the night before surgery, AFTER using the CHG soap as described below).  Do NOT apply benzoyl peroxide gel on the day of surgery.             [place] - Preparing for Surgery Before surgery, you can play an important role.  Because skin is not sterile, your skin needs to be as free of germs as possible.  You can  reduce the number of germs on your skin by washing with CHG (chlorahexidine gluconate) soap before surgery.  CHG is an antiseptic cleaner which kills germs and bonds with the skin to continue killing germs even after washing. Please DO NOT use if you have an allergy to CHG or antibacterial soaps.  If your skin becomes reddened/irritated stop using the CHG and inform your nurse when you arrive at Short Stay. Do not shave (including legs and underarms) for at least 48 hours prior to the first CHG shower.  You may shave your face/neck. Please follow these instructions carefully:  1.  Shower with CHG Soap the night before surgery and the  morning of Surgery.  2.  If you choose to wash your hair, wash your hair first as usual with your  normal  shampoo.  3.  After you sha  Incentive Spirometer  An incentive spirometer is a tool that can help keep your lungs clear and active. This tool measures how well you are filling your lungs with each breath. Taking long deep breaths may help reverse or decrease the chance of developing breathing (pulmonary) problems (especially infection) following: A long period of time when you are unable to move or be active. BEFORE THE PROCEDURE  If the spirometer includes an indicator to show your best effort, your nurse or respiratory therapist will set it to a desired goal. If possible, sit up straight or lean slightly forward. Try not to slouch. Hold the incentive spirometer in an upright position. INSTRUCTIONS FOR USE  Sit on the edge of your bed if possible, or sit up as far as you can in bed or on a chair. Hold the incentive spirometer in an upright position. Breathe out normally. Place the mouthpiece in your mouth and seal your lips tightly around it. Breathe in slowly and as deeply as possible, raising the piston or the ball toward the top of the column. Hold your breath for 3-5 seconds or for as long as  possible. Allow the piston or ball to fall to the bottom of  the column. Remove the mouthpiece from your mouth and breathe out normally. Rest for a few seconds and repeat Steps 1 through 7 at least 10 times every 1-2 hours when you are awake. Take your time and take a few normal breaths between deep breaths. The spirometer may include an indicator to show your best effort. Use the indicator as a goal to work toward during each repetition. After each set of 10 deep breaths, practice coughing to be sure your lungs are clear. If you have an incision (the cut made at the time of surgery), support your incision when coughing by placing a pillow or rolled up towels firmly against it. Once you are able to get out of bed, walk around indoors and cough well. You may stop using the incentive spirometer when instructed by your caregiver.  RISKS AND COMPLICATIONS Take your time so you do not get dizzy or light-headed. If you are in pain, you may need to take or ask for pain medication before doing incentive spirometry. It is harder to take a deep breath if you are having pain. AFTER USE Rest and breathe slowly and easily. It can be helpful to keep track of a log of your progress. Your caregiver can provide you with a simple table to help with this. If you are using the spirometer at home, follow these instructions: [place] IF:  You are having difficultly using the spirometer. You have trouble using the spirometer as often as instructed. Your pain medication is not giving enough relief while using the spirometer. You develop fever of 100.5 F (38.1 C) or higher. SEEK IMMEDIATE MEDICAL CARE IF:  You cough up bloody sputum that had not been present before. You develop fever of 102 F (38.9 C) or greater. You develop worsening pain at or near the incision site. MAKE SURE YOU:  Understand these instructions. Will watch your condition. Will get help right away if you are not doing well or get worse. Document Released: 09/09/2006 Document Revised:  07/22/2011 Document Reviewed: 11/10/2006 [facility] Patient Information 2014 [employer], [place].   ________________________________________________________________________ mpoo, rinse your hair and body thoroughly to remove the  shampoo.                           4.  Use CHG as you would any other liquid soap.  You can apply chg directly  to the skin and wash                       Gently with a scrungie or clean washcloth.  5.  Apply the CHG Soap to your body ONLY FROM THE NECK DOWN.   Do not use on face/ open                           Wound or open sores. Avoid contact with eyes, ears mouth and genitals (private parts).                       Wash face,  Genitals (private parts) with your normal soap.             6.  Wash thoroughly, paying special attention to the area where your surgery  will be performed.  7.  Thoroughly rinse your body with warm water from the neck down.  8.  DO NOT shower/wash with your normal soap after using and rinsing off  the CHG Soap.                9.  Don yourself dry with a clean towel.            10.  Wear clean pajamas.            11.  Place clean sheets on your bed the night of your first shower and do not  sleep with pets. Day of Surgery : Do not apply any lotions/deodorants the morning of surgery.  Please wear clean clothes to the hospital/surgery center.  FAILURE TO FOLLOW THESE INSTRUCTIONS MAY RESULT IN THE CANCELLATION OF YOUR SURGERY PATIENT SIGNATURE_________________________________  NURSE SIGNATURE__________________________________  ________________________________________________________________________

## 2020-11-15 ENCOUNTER — Encounter (HOSPITAL_COMMUNITY): Payer: Self-pay

## 2020-11-15 ENCOUNTER — Other Ambulatory Visit: Payer: Self-pay

## 2020-11-15 ENCOUNTER — Encounter (HOSPITAL_COMMUNITY)
Admission: RE | Admit: 2020-11-15 | Discharge: 2020-11-15 | Disposition: A | Discharge status: Home / Self Care | Payer: Medicare HMO | Source: Ambulatory Visit | Attending: Orthopedic Surgery | Admitting: Orthopedic Surgery

## 2020-11-15 DIAGNOSIS — Z01818 Encounter for other preprocedural examination: Secondary | ICD-10-CM | POA: Diagnosis not present

## 2020-11-15 HISTORY — DX: Anxiety disorder, unspecified: F41.9

## 2020-11-15 HISTORY — DX: Essential (primary) hypertension: I10

## 2020-11-15 HISTORY — DX: Family history of other specified conditions: Z84.89

## 2020-11-15 LAB — CBC
HCT: 40.3 % (ref 36.0–46.0)
Hemoglobin: 13.4 g/dL (ref 12.0–15.0)
MCH: 31.9 pg (ref 26.0–34.0)
MCHC: 33.3 g/dL (ref 30.0–36.0)
MCV: 96 fL (ref 80.0–100.0)
Platelets: 158 10*3/uL (ref 150–400)
RBC: 4.2 MIL/uL (ref 3.87–5.11)
RDW: 12.7 % (ref 11.5–15.5)
WBC: 4.5 10*3/uL (ref 4.0–10.5)
nRBC: 0 % (ref 0.0–0.2)

## 2020-11-15 LAB — BASIC METABOLIC PANEL
Anion gap: 7 (ref 5–15)
BUN: 15 mg/dL (ref 8–23)
CO2: 28 mmol/L (ref 22–32)
Calcium: 9.3 mg/dL (ref 8.9–10.3)
Chloride: 105 mmol/L (ref 98–111)
Creatinine, Ser: 0.79 mg/dL (ref 0.44–1.00)
GFR, Estimated: >60 mL/min (ref >60)
Glucose, Bld: 86 mg/dL (ref 70–99)
Potassium: 4.4 mmol/L (ref 3.5–5.1)
Sodium: 140 mmol/L (ref 135–145)

## 2020-11-15 LAB — SURGICAL PCR SCREEN
MRSA, PCR: NEGATIVE
Staphylococcus aureus: NEGATIVE

## 2020-11-17 ENCOUNTER — Other Ambulatory Visit (HOSPITAL_COMMUNITY)
Admission: RE | Admit: 2020-11-17 | Discharge: 2020-11-17 | Disposition: A | Discharge status: Home / Self Care | Payer: Medicare HMO | Source: Ambulatory Visit | Attending: Orthopedic Surgery | Admitting: Orthopedic Surgery

## 2020-11-17 DIAGNOSIS — Z20822 Contact with and (suspected) exposure to covid-19: Secondary | ICD-10-CM | POA: Insufficient documentation

## 2020-11-17 DIAGNOSIS — Z01812 Encounter for preprocedural laboratory examination: Secondary | ICD-10-CM | POA: Diagnosis not present

## 2020-11-17 LAB — SARS CORONAVIRUS 2 (TAT 6-24 HRS): SARS Coronavirus 2: NEGATIVE

## 2020-11-20 NOTE — H&P (Signed)
SHOULDER ARTHROPLASTY ADMISSION H&P  Patient ID: Shirley Woods MRN: [id number] DOB/AGE: [birth year] 79 y.o.  Chief Complaint: right shoulder pain.  Planned Procedure Date: 11/21/20 Medical Clearance by Dr. Felipa Eth  HPI: Shirley Woods is a 80 y.o. 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 The patient has a history of pain and functional disability in the right shoulder due to arthritis and has failed non-surgical conservative treatments for greater than 12 weeks to include NSAID's and/or analgesics, corticosteriod injections, and activity modification.  Onset of symptoms was gradual, starting 2 years ago with rapidlly worsening course since that time. The patient noted no past surgery on the right shoulder.  Patient currently rates pain at 5 out of 10 with activity. Patient has night pain, worsening of pain with activity and weight bearing, and pain that interferes with activities of daily living.  Patient has evidence of joint space narrowing by imaging studies. CT scan shows a full thickness rotator cuff tear.  There is no active infection.  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 Authorizing Provider  amLODipine (NORVASC) 2.5 MG tablet Take 2.5 mg by mouth daily.   Yes [provider]  Carboxymethylcellulose Sodium (DRY EYE RELIEF) 1 % GEL Place 1 application into both eyes daily as needed (dry eyes).   Yes [provider]  Cholecalciferol  (VITAMIN D) 50 MCG (2000 UT) tablet Take 2,000 Units by mouth daily.   Yes [provider]  Cyanocobalamin (B-12 PO) Take 1 capsule by mouth every other day.   Yes [provider]  ibuprofen (ADVIL) 200 MG tablet Take 400 mg by mouth every 6 (six) hours as needed for headache or moderate pain.   Yes [provider]  LORazepam (ATIVAN) 1 MG tablet Take 1.5 mg by mouth at bedtime.   Yes [provider]  Multiple Vitamin (MULTIVITAMIN WITH MINERALS) TABS tablet Take 1 tablet by mouth daily.   Yes [provider]  Omega-3 Fatty Acids (FISH OIL PO) Take 1 capsule by mouth 2 (two) times daily.   Yes [provider]  OVER THE COUNTER MEDICATION Take 3 tablets by mouth 2 (two) times daily. algea cal   Yes [provider]  OVER THE COUNTER MEDICATION Take 2 tablets by mouth every evening. strontium boost (at least 2 hours after the 2nd dose of algea cal)   Yes [provider]  OVER THE COUNTER MEDICATION Place 1 patch onto the skin at bedtime. Zleep otc sleep patches   Yes [provider]  Plant Sterols and Stanols (CHOLEST OFF PO) Take 2 tablets by mouth 2 (two) times daily.   Yes [provider]  sertraline (ZOLOFT) 100 MG tablet Take 100 mg by mouth daily.    Yes [provider]  zinc gluconate 50 MG tablet Take 50 mg by mouth daily.   Yes [provider]   Social History   Socioeconomic History   Marital status: Married  Spouse name: Not on file   Number of children: Not on file   Years of education: Not on file   Highest education level: Not on file  Occupational History   Not on file  Tobacco Use   Smoking status: Never   Smokeless tobacco: Never  Vaping Use   Vaping Use: Never used  Substance and Sexual Activity   Alcohol use: Yes    Alcohol/week: 1.0 standard drink    Types: 1 Glasses of wine per week    Comment: glass a day   Drug use: No   Sexual activity: Not Currently     Birth control/protection: Surgical  Other Topics Concern   Not on file  Social History Narrative   Not on file   Social Determinants of Health   Financial Resource Strain: Not on file  Food Insecurity: Not on file  Transportation Needs: Not on file  Physical Activity: Not on file  Stress: Not on file  Social Connections: Not on file   No family history on file.  ROS: Currently denies lightheadedness, dizziness, Fever, chills, CP, SOB. No personal history of DVT, PE, MI, or CVA. No loose teeth or dentures All other systems have been reviewed and were otherwise currently negative with the exception of those mentioned in the HPI and as above.  Objective: Vitals: Ht: 5' 1.5" Wt: 133.2 lbs Temp: 98.2 BP: 133/73 Pulse: 74 O2 97% on room air.   Physical Exam: General: Alert, NAD.   HEENT: EOMI, Good Neck Extension  Pulm: No increased work of breathing.  Clear B/L A/P w/o crackle or wheeze.  CV: RRR, No m/g/r appreciated  GI: soft, NT, ND Neuro: Neuro without gross focal deficit.  Sensation intact distally Skin: No lesions in the area of chief complaint MSK/Surgical Site: right shoulder pain with range of motion.  Forward flexion/abduction approximately 160 deg.  Internal rotation to L5.  External rotation to 10.  No AC pain.  NoBiceps pain. NVI distally.  Imaging Review CT demonstrates severe degenerative joint disease of the right shoulder with full thickness rotator cuff tear  Assessment: Right shoulder rotator cuff arthopathy  Plan: Plan for Procedure(s): REVERSE SHOULDER ARTHROPLASTY  The patient history, physical exam, clinical judgement of the provider and imaging are consistent with end stage degenerative joint disease and reverse total joint arthroplasty is deemed medically necessary. The treatment options including medical management, injection therapy, and arthroplasty were discussed at length. The risks and benefits of Procedure(s): REVERSE SHOULDER ARTHROPLASTY were  presented and reviewed.  The risks of nonoperative treatment, versus surgical intervention including but not limited to continued pain, aseptic loosening, stiffness, dislocation/subluxation, infection, bleeding, nerve injury, blood clots, cardiopulmonary complications, morbidity, mortality, among others were discussed. The patient verbalizes understanding and wishes to proceed with the plan.  Patient is being admitted for surgery, OT, pain control, prophylactic antibiotics, VTE prophylaxis, progressive ambulation, ADL's and discharge planning.   Dental prophylaxis discussed and recommended for 2 years postoperatively.  The patient does meet the criteria for TXA which will be used perioperatively.   The patient is planning to be discharged home care of husband    Jola Baptist 11/20/2020 5:13 PM

## 2020-11-21 NOTE — Progress Notes (Signed)
Spoke to patient's husband and gave updated arrival time of 1030 for procedure on 11-28-20.  Also advised to stop clear liquids at 1000.  Covid test scheduled for 11-24-20 @ 0830.

## 2020-11-24 ENCOUNTER — Other Ambulatory Visit (HOSPITAL_COMMUNITY)
Admission: RE | Admit: 2020-11-24 | Discharge: 2020-11-24 | Disposition: A | Discharge status: Home / Self Care | Payer: Medicare HMO | Source: Ambulatory Visit | Attending: Orthopedic Surgery | Admitting: Orthopedic Surgery

## 2020-11-24 DIAGNOSIS — Z01812 Encounter for preprocedural laboratory examination: Secondary | ICD-10-CM | POA: Diagnosis not present

## 2020-11-24 DIAGNOSIS — Z20822 Contact with and (suspected) exposure to covid-19: Secondary | ICD-10-CM | POA: Insufficient documentation

## 2020-11-24 LAB — SARS CORONAVIRUS 2 (TAT 6-24 HRS): SARS Coronavirus 2: NEGATIVE

## 2020-11-28 ENCOUNTER — Ambulatory Visit (HOSPITAL_COMMUNITY)
Admission: RE | Admit: 2020-11-28 | Discharge: 2020-11-28 | Disposition: A | Discharge status: Home / Self Care | Payer: Medicare HMO | Source: Ambulatory Visit | Attending: Orthopedic Surgery | Admitting: Orthopedic Surgery

## 2020-11-28 ENCOUNTER — Encounter (HOSPITAL_COMMUNITY): Payer: Self-pay | Admitting: Orthopedic Surgery

## 2020-11-28 ENCOUNTER — Encounter (HOSPITAL_COMMUNITY)
Admission: RE | Disposition: A | Discharge status: Home / Self Care | Payer: Self-pay | Source: Ambulatory Visit | Attending: Orthopedic Surgery

## 2020-11-28 ENCOUNTER — Ambulatory Visit (HOSPITAL_COMMUNITY): Payer: Medicare HMO

## 2020-11-28 ENCOUNTER — Ambulatory Visit (HOSPITAL_COMMUNITY): Payer: Medicare HMO | Admitting: Anesthesiology

## 2020-11-28 DIAGNOSIS — Z96611 Presence of right artificial shoulder joint: Secondary | ICD-10-CM

## 2020-11-28 DIAGNOSIS — Z01818 Encounter for other preprocedural examination: Secondary | ICD-10-CM | POA: Diagnosis not present

## 2020-11-28 DIAGNOSIS — R600 Localized edema: Secondary | ICD-10-CM | POA: Diagnosis not present

## 2020-11-28 DIAGNOSIS — Z79899 Other long term (current) drug therapy: Secondary | ICD-10-CM | POA: Diagnosis not present

## 2020-11-28 DIAGNOSIS — Z471 Aftercare following joint replacement surgery: Secondary | ICD-10-CM | POA: Diagnosis not present

## 2020-11-28 DIAGNOSIS — R69 Illness, unspecified: Secondary | ICD-10-CM | POA: Diagnosis not present

## 2020-11-28 DIAGNOSIS — G8918 Other acute postprocedural pain: Secondary | ICD-10-CM | POA: Diagnosis not present

## 2020-11-28 DIAGNOSIS — M19011 Primary osteoarthritis, right shoulder: Secondary | ICD-10-CM | POA: Insufficient documentation

## 2020-11-28 DIAGNOSIS — I10 Essential (primary) hypertension: Secondary | ICD-10-CM | POA: Diagnosis not present

## 2020-11-28 HISTORY — PX: REVERSE SHOULDER ARTHROPLASTY: SHX5054

## 2020-11-28 SURGERY — ARTHROPLASTY, SHOULDER, TOTAL, REVERSE
Anesthesia: General | Site: Shoulder | Laterality: Right

## 2020-11-28 MED ORDER — MIDAZOLAM HCL 2 MG/2ML IJ SOLN
INTRAMUSCULAR | Status: AC
  Filled 2020-11-28: qty 2

## 2020-11-28 MED ORDER — CHLORHEXIDINE GLUCONATE 0.12 % MT SOLN
15.0000 mL | Freq: Once | OROMUCOSAL | Status: AC
  Administered 2020-11-28: 15 mL via OROMUCOSAL

## 2020-11-28 MED ORDER — EPHEDRINE SULFATE 50 MG/ML IJ SOLN
INTRAMUSCULAR | Status: DC | PRN
  Administered 2020-11-28: 10 mg via INTRAVENOUS

## 2020-11-28 MED ORDER — ONDANSETRON HCL 4 MG/2ML IJ SOLN
INTRAMUSCULAR | Status: AC
  Filled 2020-11-28: qty 2

## 2020-11-28 MED ORDER — OXYCODONE HCL 5 MG/5ML PO SOLN: 5.0000 mg | Freq: Once | ORAL | Status: DC | PRN

## 2020-11-28 MED ORDER — SODIUM CHLORIDE 0.9 % IR SOLN
Status: DC | PRN
  Administered 2020-11-28: 1000 mL

## 2020-11-28 MED ORDER — PROPOFOL 10 MG/ML IV BOLUS
INTRAVENOUS | Status: DC | PRN
  Administered 2020-11-28: 110 mg via INTRAVENOUS

## 2020-11-28 MED ORDER — ROCURONIUM BROMIDE 100 MG/10ML IV SOLN
INTRAVENOUS | Status: DC | PRN
  Administered 2020-11-28: 40 mg via INTRAVENOUS
  Administered 2020-11-28 (×2): 15 mg via INTRAVENOUS

## 2020-11-28 MED ORDER — LACTATED RINGERS IV SOLN: INTRAVENOUS | Status: DC

## 2020-11-28 MED ORDER — ORAL CARE MOUTH RINSE: 15.0000 mL | Freq: Once | OROMUCOSAL | Status: AC

## 2020-11-28 MED ORDER — PHENYLEPHRINE HCL (PRESSORS) 10 MG/ML IV SOLN
INTRAVENOUS | Status: AC
  Filled 2020-11-28: qty 1

## 2020-11-28 MED ORDER — BUPIVACAINE-EPINEPHRINE (PF) 0.25% -1:200000 IJ SOLN
INTRAMUSCULAR | Status: AC
  Filled 2020-11-28: qty 30

## 2020-11-28 MED ORDER — ROCURONIUM BROMIDE 10 MG/ML (PF) SYRINGE
PREFILLED_SYRINGE | INTRAVENOUS | Status: AC
  Filled 2020-11-28: qty 10

## 2020-11-28 MED ORDER — EPHEDRINE 5 MG/ML INJ
INTRAVENOUS | Status: AC
  Filled 2020-11-28: qty 5

## 2020-11-28 MED ORDER — MIDAZOLAM HCL 5 MG/5ML IJ SOLN
INTRAMUSCULAR | Status: DC | PRN
  Administered 2020-11-28: 1 mg via INTRAVENOUS

## 2020-11-28 MED ORDER — ALBUMIN HUMAN 5 % IV SOLN
INTRAVENOUS | Status: AC
  Filled 2020-11-28: qty 250

## 2020-11-28 MED ORDER — OXYCODONE HCL 5 MG PO TABS: 5.0000 mg | ORAL_TABLET | Freq: Once | ORAL | Status: DC | PRN

## 2020-11-28 MED ORDER — PHENYLEPHRINE HCL-NACL 10-0.9 MG/250ML-% IV SOLN
INTRAVENOUS | Status: AC
  Filled 2020-11-28: qty 250

## 2020-11-28 MED ORDER — ACETAMINOPHEN 500 MG PO TABS
1000.0000 mg | ORAL_TABLET | Freq: Once | ORAL | Status: AC
  Administered 2020-11-28: 1000 mg via ORAL
  Filled 2020-11-28: qty 2

## 2020-11-28 MED ORDER — PHENYLEPHRINE HCL-NACL 10-0.9 MG/250ML-% IV SOLN
INTRAVENOUS | Status: DC | PRN
  Administered 2020-11-28: 50 ug/min via INTRAVENOUS

## 2020-11-28 MED ORDER — PHENYLEPHRINE HCL (PRESSORS) 10 MG/ML IV SOLN
INTRAVENOUS | Status: DC | PRN
  Administered 2020-11-28 (×2): 80 ug via INTRAVENOUS

## 2020-11-28 MED ORDER — FENTANYL CITRATE (PF) 100 MCG/2ML IJ SOLN
INTRAMUSCULAR | Status: DC | PRN
  Administered 2020-11-28: 50 ug via INTRAVENOUS

## 2020-11-28 MED ORDER — DEXAMETHASONE SODIUM PHOSPHATE 10 MG/ML IJ SOLN
INTRAMUSCULAR | Status: AC
  Filled 2020-11-28: qty 1

## 2020-11-28 MED ORDER — APREPITANT 40 MG PO CAPS
40.0000 mg | ORAL_CAPSULE | Freq: Once | ORAL | Status: AC
  Administered 2020-11-28: 40 mg via ORAL
  Filled 2020-11-28: qty 1

## 2020-11-28 MED ORDER — FENTANYL CITRATE (PF) 100 MCG/2ML IJ SOLN
50.0000 ug | INTRAMUSCULAR | Status: DC
  Administered 2020-11-28: 50 ug via INTRAVENOUS
  Filled 2020-11-28: qty 2

## 2020-11-28 MED ORDER — ONDANSETRON HCL 4 MG/2ML IJ SOLN: 4.0000 mg | Freq: Once | INTRAMUSCULAR | Status: DC | PRN

## 2020-11-28 MED ORDER — LIDOCAINE HCL (CARDIAC) PF 100 MG/5ML IV SOSY
PREFILLED_SYRINGE | INTRAVENOUS | Status: DC | PRN
  Administered 2020-11-28: 50 mg via INTRAVENOUS

## 2020-11-28 MED ORDER — LIDOCAINE 2% (20 MG/ML) 5 ML SYRINGE
INTRAMUSCULAR | Status: AC
  Filled 2020-11-28: qty 5

## 2020-11-28 MED ORDER — PHENYLEPHRINE 40 MCG/ML (10ML) SYRINGE FOR IV PUSH (FOR BLOOD PRESSURE SUPPORT)
PREFILLED_SYRINGE | INTRAVENOUS | Status: AC
  Filled 2020-11-28: qty 10

## 2020-11-28 MED ORDER — OXYCODONE HCL 5 MG PO TABS: 5.0000 mg | ORAL_TABLET | Freq: Four times a day (QID) | ORAL | 0 refills | Status: DC | PRN

## 2020-11-28 MED ORDER — MIDAZOLAM HCL 2 MG/2ML IJ SOLN
1.0000 mg | INTRAMUSCULAR | Status: DC
  Filled 2020-11-28: qty 2

## 2020-11-28 MED ORDER — DEXAMETHASONE SODIUM PHOSPHATE 10 MG/ML IJ SOLN
INTRAMUSCULAR | Status: DC | PRN
  Administered 2020-11-28: 10 mg via INTRAVENOUS

## 2020-11-28 MED ORDER — FENTANYL CITRATE (PF) 100 MCG/2ML IJ SOLN
INTRAMUSCULAR | Status: AC
  Filled 2020-11-28: qty 2

## 2020-11-28 MED ORDER — SODIUM CHLORIDE 0.9 % IV SOLN
2.0000 g | INTRAVENOUS | Status: AC
  Administered 2020-11-28: 2 g via INTRAVENOUS
  Filled 2020-11-28: qty 2

## 2020-11-28 MED ORDER — FENTANYL CITRATE (PF) 100 MCG/2ML IJ SOLN: 25.0000 ug | INTRAMUSCULAR | Status: DC | PRN

## 2020-11-28 MED ORDER — POVIDONE-IODINE 10 % EX SWAB
2.0000 "application " | Freq: Once | CUTANEOUS | Status: AC
  Administered 2020-11-28: 2 via TOPICAL

## 2020-11-28 MED ORDER — ONDANSETRON HCL 4 MG/2ML IJ SOLN
INTRAMUSCULAR | Status: DC | PRN
  Administered 2020-11-28: 4 mg via INTRAVENOUS

## 2020-11-28 MED ORDER — SENNA-DOCUSATE SODIUM 8.6-50 MG PO TABS: 2.0000 | ORAL_TABLET | Freq: Every day | ORAL | 1 refills | Status: DC

## 2020-11-28 MED ORDER — PROPOFOL 10 MG/ML IV BOLUS
INTRAVENOUS | Status: AC
  Filled 2020-11-28: qty 20

## 2020-11-28 MED ORDER — ONDANSETRON HCL 4 MG PO TABS: 4.0000 mg | ORAL_TABLET | Freq: Three times a day (TID) | ORAL | 0 refills | Status: DC | PRN

## 2020-11-28 MED ORDER — BUPIVACAINE LIPOSOME 1.3 % IJ SUSP
INTRAMUSCULAR | Status: DC | PRN
  Administered 2020-11-28: 10 mL via PERINEURAL

## 2020-11-28 MED ORDER — SUGAMMADEX SODIUM 200 MG/2ML IV SOLN
INTRAVENOUS | Status: DC | PRN
  Administered 2020-11-28: 150 mg via INTRAVENOUS

## 2020-11-28 MED ORDER — TRANEXAMIC ACID-NACL 1000-0.7 MG/100ML-% IV SOLN
1000.0000 mg | INTRAVENOUS | Status: AC
  Administered 2020-11-28: 1000 mg via INTRAVENOUS
  Filled 2020-11-28: qty 100

## 2020-11-28 MED ORDER — BUPIVACAINE HCL (PF) 0.5 % IJ SOLN
INTRAMUSCULAR | Status: DC | PRN
  Administered 2020-11-28: 15 mL via PERINEURAL

## 2020-11-28 SURGICAL SUPPLY — 65 items
BAG COUNTER SPONGE SURGICOUNT (BAG) ×2 IMPLANT
BAG ZIPLOCK 12X15 (MISCELLANEOUS) ×2 IMPLANT
BASEPLATE GLENOSPHERE 25 (Plate) ×2 IMPLANT
BEARING HUMERAL SHLDER 36M STD (Shoulder) ×1 IMPLANT
BIT DRILL QUICK REL 1/8 2PK SL (DRILL) ×1 IMPLANT
BIT DRILL TWIST 2.7 (BIT) ×2 IMPLANT
BLADE SAW SAG 73X25 THK (BLADE) ×1
BLADE SAW SGTL 73X25 THK (BLADE) ×1 IMPLANT
BOOTIES KNEE HIGH SLOAN (MISCELLANEOUS) ×2 IMPLANT
BOWL SMART MIX CTS (DISPOSABLE) IMPLANT
CLSR STERI-STRIP ANTIMIC 1/2X4 (GAUZE/BANDAGES/DRESSINGS) ×2 IMPLANT
COOLER ICEMAN CLASSIC (MISCELLANEOUS) ×2 IMPLANT
COVER BACK TABLE 60X90IN (DRAPES) ×2 IMPLANT
COVER MAYO STAND STRL (DRAPES) ×2 IMPLANT
COVER SURGICAL LIGHT HANDLE (MISCELLANEOUS) ×2 IMPLANT
DECANTER SPIKE VIAL GLASS SM (MISCELLANEOUS) IMPLANT
DRAPE ORTHO SPLIT 77X108 STRL (DRAPES) ×1
DRAPE POUCH INSTRU U-SHP 10X18 (DRAPES) ×2 IMPLANT
DRAPE SHEET LG 3/4 BI-LAMINATE (DRAPES) ×4 IMPLANT
DRAPE SURG 17X11 SM STRL (DRAPES) ×2 IMPLANT
DRAPE SURG ORHT 6 SPLT 77X108 (DRAPES) ×1 IMPLANT
DRAPE U-SHAPE 47X51 STRL (DRAPES) ×2 IMPLANT
DRILL QUICK RELEASE 1/8 INCH (DRILL) ×1
DRSG MEPILEX BORDER 4X8 (GAUZE/BANDAGES/DRESSINGS) ×2 IMPLANT
DURAPREP 26ML APPLICATOR (WOUND CARE) ×4 IMPLANT
ELECT REM PT RETURN 15FT ADLT (MISCELLANEOUS) ×2 IMPLANT
GLENOID SPHERE STD STRL 36MM (Orthopedic Implant) ×2 IMPLANT
GLOVE SRG 8 PF TXTR STRL LF DI (GLOVE) ×1 IMPLANT
GLOVE SURG ENC MOIS LTX SZ7 (GLOVE) ×2 IMPLANT
GLOVE SURG ORTHO LTX SZ7.5 (GLOVE) ×2 IMPLANT
GLOVE SURG UNDER POLY LF SZ7 (GLOVE) ×2 IMPLANT
GLOVE SURG UNDER POLY LF SZ8 (GLOVE) ×1
GOWN STRL REUS W/TWL 2XL LVL3 (GOWN DISPOSABLE) ×2 IMPLANT
GOWN STRL REUS W/TWL LRG LVL3 (GOWN DISPOSABLE) ×2 IMPLANT
HOOD PEEL AWAY FLYTE STAYCOOL (MISCELLANEOUS) ×4 IMPLANT
KIT BASIN OR (CUSTOM PROCEDURE TRAY) ×2 IMPLANT
KIT TURNOVER KIT A (KITS) ×2 IMPLANT
PACK SHOULDER (CUSTOM PROCEDURE TRAY) ×2 IMPLANT
PAD COLD SHLDR WRAP-ON (PAD) ×2 IMPLANT
PENCIL SMOKE EVACUATOR (MISCELLANEOUS) IMPLANT
PIN THREADED REVERSE (PIN) ×2 IMPLANT
PROTECTOR NERVE ULNAR (MISCELLANEOUS) ×2 IMPLANT
RESTRAINT HEAD UNIVERSAL NS (MISCELLANEOUS) ×2 IMPLANT
SCREW BONE CORT 6.5X35MM (Screw) ×2 IMPLANT
SCREW BONE LOCKING 4.75X30X3.5 (Screw) ×4 IMPLANT
SCREW LOCKING 4.75MMX15MM (Screw) ×4 IMPLANT
SHOULDER HUMERAL BEAR 36M STD (Shoulder) ×2 IMPLANT
SLING ARM IMMOBILIZER LRG (SOFTGOODS) ×2 IMPLANT
SLING ARM IMMOBILIZER MED (SOFTGOODS) ×2 IMPLANT
SPONGE T-LAP 18X18 ~~LOC~~+RFID (SPONGE) ×2 IMPLANT
SPONGE T-LAP 4X18 ~~LOC~~+RFID (SPONGE) ×2 IMPLANT
STEM HUMERAL STRL 12MMX14MM (Stem) ×2 IMPLANT
SUCTION FRAZIER HANDLE 12FR (TUBING) ×1
SUCTION TUBE FRAZIER 12FR DISP (TUBING) ×1 IMPLANT
SUPPORT WRAP ARM LG (MISCELLANEOUS) ×2 IMPLANT
SUT MAXBRAID #2 CVD NDL (SUTURE) ×2 IMPLANT
SUT MAXBRAID #5 CCS-NDL 2PK (SUTURE) ×2 IMPLANT
SUT VIC AB 1 CT1 36 (SUTURE) ×2 IMPLANT
SUT VIC AB 2-0 CT1 27 (SUTURE) ×1
SUT VIC AB 2-0 CT1 TAPERPNT 27 (SUTURE) ×1 IMPLANT
SUT VIC AB 3-0 SH 8-18 (SUTURE) ×2 IMPLANT
TOWEL OR 17X26 10 PK STRL BLUE (TOWEL DISPOSABLE) ×2 IMPLANT
TOWEL OR NON WOVEN STRL DISP B (DISPOSABLE) ×2 IMPLANT
TOWER CARTRIDGE SMART MIX (DISPOSABLE) IMPLANT
TRAY HUM MINI SHOULDER +3 40 (Joint) ×2 IMPLANT

## 2020-11-28 NOTE — Anesthesia Preprocedure Evaluation (Addendum)
Anesthesia Evaluation  Patient identified by MRN, date of birth, ID band Patient awake    Reviewed: Allergy & Precautions, NPO status , Patient's Chart, lab work & pertinent test results  History of Anesthesia Complications Negative for: history of anesthetic complications  Airway Mallampati: II  TM Distance: >3 FB Neck ROM: Full    Dental  (+) Dental Advisory Given, Teeth Intact   Pulmonary neg pulmonary ROS,    Pulmonary exam normal        Cardiovascular hypertension, Pt. on medications Normal cardiovascular exam     Neuro/Psych PSYCHIATRIC DISORDERS Anxiety  Hx nontraumatic SAH     GI/Hepatic negative GI ROS, Neg liver ROS,   Endo/Other  negative endocrine ROS  Renal/GU negative Renal ROS     Musculoskeletal  (+) Arthritis ,   Abdominal   Peds  Hematology negative hematology ROS (+)   Anesthesia Other Findings Covid test negative   Reproductive/Obstetrics                            Anesthesia Physical Anesthesia Plan  ASA: 2  Anesthesia Plan: General   Post-op Pain Management:  Regional for Post-op pain   Induction: Intravenous  PONV Risk Score and Plan: 3 and Treatment may vary due to age or medical condition, Ondansetron and Aprepitant  Airway Management Planned: Oral ETT  Additional Equipment: None  Intra-op Plan:   Post-operative Plan: Extubation in OR  Informed Consent: I have reviewed the patients History and Physical, chart, labs and discussed the procedure including the risks, benefits and alternatives for the proposed anesthesia with the patient or authorized representative who has indicated his/her understanding and acceptance.     Dental advisory given  Plan Discussed with: CRNA and Anesthesiologist  Anesthesia Plan Comments:        Anesthesia Quick Evaluation

## 2020-11-28 NOTE — Interval H&P Note (Signed)
History and Physical Interval Note:  11/28/2020 9:33 AM  Shirley Woods  has presented today for surgery, with the diagnosis of DJD right shoulder.  The various methods of treatment have been discussed with the patient and family. After consideration of risks, benefits and other options for treatment, the patient has consented to  Procedure(s): REVERSE SHOULDER ARTHROPLASTY (Right) as a surgical intervention.  The patient's history has been reviewed, patient examined, no change in status, stable for surgery.  I have reviewed the patient's chart and labs.  Questions were answered to the patient's satisfaction.     [name]

## 2020-11-28 NOTE — Anesthesia Procedure Notes (Signed)
Procedure Name: Intubation Date/Time: 11/28/2020 11:06 AM Performed by: Garrel Ridgel, CRNA Pre-anesthesia Checklist: Patient identified, Emergency Drugs available, Suction available and Patient being monitored Patient Re-evaluated:Patient Re-evaluated prior to induction Oxygen Delivery Method: Circle system utilized Preoxygenation: Pre-oxygenation with 100% oxygen Induction Type: IV induction Ventilation: Mask ventilation without difficulty Laryngoscope Size: Mac and 3 Grade View: Grade I Tube type: Oral Tube size: 7.0 mm Number of attempts: 1 Airway Equipment and Method: Stylet and Oral airway Placement Confirmation: ETT inserted through vocal cords under direct vision, positive ETCO2 and breath sounds checked- equal and bilateral Secured at: 22 cm Tube secured with: Tape Dental Injury: Teeth and Oropharynx as per pre-operative assessment

## 2020-11-28 NOTE — Evaluation (Signed)
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 Therapist provided education and instruction to patient and spouse in regards to exercises, precautions, positioning, donning upper extremity clothing and bathing while maintaining shoulder precautions, ice and edema management and donning/doffing sling. Patient and spouse verbalized understanding and demonstrated as needed. Patient needed assistance to donn shirt, underwear, pants, socks and shoes and provided with instruction on compensatory strategies to perform ADLs. Patient to follow up with MD for further therapy needs.      Follow Up Recommendations  Follow surgeon's recommendation for DC plan and follow-up therapies    Equipment Recommendations  None recommended by OT    Recommendations for Other Services       Precautions / Restrictions Precautions Precautions: Shoulder Type of Shoulder Precautions: no ROM, NWB Shoulder Interventions: Shoulder sling/immobilizer;At all times;Off for dressing/bathing/exercises Restrictions Weight Bearing Restrictions: Yes RUE Weight Bearing: Non weight bearing      Mobility Bed Mobility                    Transfers                      Balance                                           ADL either performed or assessed with clinical judgement   ADL Overall ADL's : Needs  assistance/impaired Eating/Feeding: Set up;Sitting   Grooming: Wash/dry face;Oral care;Minimal assistance;Sitting   Upper Body Bathing: Sitting;Maximal assistance   Lower Body Bathing: Maximal assistance;Sit to/from stand   Upper Body Dressing : Maximal assistance;Sitting;Cueing for UE precautions;Adhering to UE precautions   Lower Body Dressing: Maximal assistance;Sit to/from stand   Toilet Transfer: Min guard   Toileting- Water quality scientist and Hygiene: Minimal assistance;Sit to/from stand       Functional mobility during ADLs: Min guard General ADL Comments: patients husband demonstrated ability to assist wife with all tasks above through teach back method.     Vision         Perception     Praxis      Pertinent Vitals/Pain Pain Assessment: No/denies pain (patient has nerve block in place at this time)     Hand Dominance Right   Extremity/Trunk Assessment Upper Extremity Assessment Upper Extremity Assessment: RUE deficits/detail RUE Deficits / Details: total reverse shoulder.           Communication Communication Communication: HOH   Cognition Arousal/Alertness: Awake/alert Behavior During Therapy: WFL for tasks assessed/performed Overall Cognitive Status: Difficult to assess                                 General Comments: patient was impulsive with poor carryover of education. patient was noted to repeat questions multiple times during session. caregiver was able to cue patient  as needed   General Comments       Exercises     Shoulder Instructions Shoulder Instructions Donning/doffing shirt without moving shoulder: Caregiver independent with task Method for sponge bathing under operated UE: Caregiver independent with task Donning/doffing sling/immobilizer: Caregiver independent with task Correct positioning of sling/immobilizer: Caregiver independent with task ROM for elbow, wrist and digits of operated UE: Caregiver independent  with task Sling wearing schedule (on at all times/off for ADL's): Caregiver independent with task Proper positioning of operated UE when showering: Caregiver independent with task Positioning of UE while sleeping: Caregiver independent with task    Home Living Family/patient expects to be discharged to:: Private residence Living Arrangements: Spouse/significant other Available Help at Discharge: Family;Available 24 hours/day Type of Home: House                                  Prior Functioning/Environment Level of Independence: Independent with assistive device(s)                 OT Problem List:        OT Treatment/Interventions:      OT Goals(Current goals can be found in the care plan section)    OT Frequency:     Barriers to D/C:            Co-evaluation              AM-PAC OT "6 Clicks" Daily Activity     Outcome Measure Help from another person eating meals?: A Little Help from another person taking care of personal grooming?: A Little Help from another person toileting, which includes using toliet, bedpan, or urinal?: A Lot Help from another person bathing (including washing, rinsing, drying)?: A Lot Help from another person to put on and taking off regular upper body clothing?: A Lot Help from another person to put on and taking off regular lower body clothing?: A Lot 6 Click Score: 14   End of Session Nurse Communication: Other (comment) (nurse cleared patient for session)  Activity Tolerance:   Patient left:    OT Visit Diagnosis: Pain;Muscle weakness (generalized) (M62.81) Pain - Right/Left: Right Pain - part of body: Shoulder                Time: 6153-7943 OT Time Calculation (min): 33 min Charges:  OT General Charges $OT Visit: 1 Visit OT Evaluation $OT Eval Low Complexity: 1 Low OT Treatments $Self Care/Home Management : 8-22 mins  Jackelyn Poling OTR/L, MS Acute Rehabilitation Department Office# [phone] Pager#  [place] 11/28/2020, 4:38 PM

## 2020-11-28 NOTE — Op Note (Signed)
11/28/2020  1:15 PM  PATIENT:  Shirley Woods Sees    PRE-OPERATIVE DIAGNOSIS: Right rotator cuff arthropathy  POST-OPERATIVE DIAGNOSIS:  Same  PROCEDURE: RIGHT reverse Total Shoulder Arthroplasty  SURGEON:  [name], MD  PHYSICIAN ASSISTANT: Merlene Pulling, PA-C, present and scrubbed throughout the case, critical for completion in a timely fashion, and for retraction, instrumentation, and closure.  ANESTHESIA:   General with interscalene block using Exparel  ESTIMATED BLOOD LOSS: 100 mL  UNIQUE ASPECTS OF THE CASE: She had advanced rotator cuff arthropathy with a present but dysfunctional looking subscapularis, the supraspinatus was atrophic and badly torn and there was a large undersurface acromial spur which had to be removed.  This was more than just the CA ligament calcification, but rather a chronic hypertrophy of the acromion.  The infraspinatus was somewhat poor quality.  The glenoid was very sclerotic, and I basically removed just a little bit of posterior bone, matching the anterior surface, with about 20 degrees of inferior inclination, although my first placement of the baseplate the plate would not sit down fully because of the significant sclerosis of the vault.  I actually delivered this out, and then prepared the baseplate reamer again using a "wobbly" technique to give some freedom for the implant to sit into the sclerotic bone.  I think the tolerance was just too tight.  Ultimately I was able to get the implant down nicely.  I did not have a very good feel for the length of the screws superiorly and inferiorly, I am not sure why, but the depth gauge really did not catch good bone on the back but I did have decent length, these were around 30 mm in length, with 15s on the front and back.  The reduction was fairly easy, although it felt stable, and I did not take an aggressive head cut or medialize the glenosphere, so I felt that the standard height was  appropriate.  PREOPERATIVE INDICATIONS:  Shirley Woods is a  80 y.o. female with a diagnosis of DJD right shoulder who failed conservative measures and elected for surgical management.    The risks benefits and alternatives were discussed with the patient preoperatively including but not limited to the risks of infection, bleeding, nerve injury, cardiopulmonary complications, the need for revision surgery, dislocation, brachial plexus palsy, incomplete relief of pain, among others, and the patient was willing to proceed.  OPERATIVE IMPLANTS: Biomet size 12 micro humeral stem press-fit with a 40 + 3 mm offset reverse shoulder arthroplasty tray with a 36 mm Vivacit-E standardy polyethylene liner and a 36 mm glenosphere set on B placed with inferior offset, with a mini baseplate and 4 locking screws and one central nonlocking screw.  OPERATIVE FINDINGS: Advanced rotator cuff arthropathy, extreme sclerosis of the glenoid, inferior anterior wear.  OPERATIVE PROCEDURE: The patient was brought to the operating room and placed in the supine position. General anesthesia was administered. IV antibiotics were given.  Time out was performed. The upper extremity was prepped and draped in usual sterile fashion. The patient was in a beachchair position. Deltopectoral approach was carried out. The biceps was tenodesed to the pectoralis tendon with #2 max braid. The subscapularis was released off of the bone.   I then performed circumferential releases of the humerus, and then dislocated the head, and then reamed with the reamer to the above named size.  I then applied the jig, and cut the humeral head in 30 of retroversion, and then turned my attention to  the glenoid.  I did broach up with the humeral stem, and then placed a protector over the proximal humerus.  Deep retractors were placed, and I resected the labrum, and then placed a guidepin into the center position on the glenoid, with slight inferior  inclination. I then reamed over the guidepin, and this created a small metaphyseal cancellus blush inferiorly, removing just the cartilage to the subchondral bone superiorly. The base plate was selected and impacted place, and then I secured it centrally with a nonlocking screw, and I had excellent purchase both inferiorly and superiorly. I placed a short locking screws on anterior and posterior aspects.  I did have to remove a hypertrophic posterior osteophyte, as well as osteophyte and exostosis underneath the acromion.  I then turned my attention to the glenosphere, and impacted this into place, placing slight inferior offset (set on B).   The glenosphere was completely seated, and had engagement of the [facility] taper. I then turned my attention back to the humerus.  I sequentially broached, and then trialed, and was found to restore soft tissue tension, and it had 2 finger tightness. Therefore the above named components were selected. The shoulder felt stable throughout functional motion.  Before I placed the real prosthesis I had also placed a total of 3 #5 max braid through drill holes in the humerus for later subscapularis repair.  I then impacted the real prosthesis into place, as well as the real humeral tray, and reduced the shoulder. The shoulder had excellent motion, and was stable, and I irrigated the wounds copiously.    I then used these to repair the subscapularis. This came down to bone.  I then irrigated the shoulder copiously once more, repaired the deltopectoral interval with Vicryl followed by subcutaneous Vicryl with Steri-Strips and sterile gauze for the skin. The patient was awakened and returned back in stable and satisfactory condition. There were no complications and She tolerated the procedure well.

## 2020-11-28 NOTE — Anesthesia Procedure Notes (Signed)
Procedure Name: Intubation Date/Time: 11/28/2020 11:06 AM Performed by: Flynn-Cook, Toretto Tingler A, CRNA Pre-anesthesia Checklist: Patient identified, Emergency Drugs available, Suction available and Patient being monitored Patient Re-evaluated:Patient Re-evaluated prior to induction Oxygen Delivery Method: Circle system utilized Preoxygenation: Pre-oxygenation with 100% oxygen Induction Type: IV induction Ventilation: Mask ventilation without difficulty Laryngoscope Size: Mac and 3 Grade View: Grade I Tube type: Oral Tube size: 7.0 mm Number of attempts: 1 Airway Equipment and Method: Stylet and Oral airway Placement Confirmation: ETT inserted through vocal cords under direct vision, positive ETCO2 and breath sounds checked- equal and bilateral Secured at: 22 cm Tube secured with: Tape Dental Injury: Teeth and Oropharynx as per pre-operative assessment      

## 2020-11-28 NOTE — Discharge Instructions (Signed)
Diet: As you were doing prior to hospitalization   Shower:  May shower but keep the wounds dry, use an occlusive plastic wrap, NO SOAKING IN TUB.  If the bandage gets wet, change with a clean dry gauze.  If you have a splint on, leave the splint in place and keep the splint dry with a plastic bag.  Dressing:  You may change your dressing 3-5 days after surgery, unless you have a splint.  If you have a splint, then just leave the splint in place and we will change your bandages during your first follow-up appointment.    If you had hand or foot surgery, we will plan to remove your stitches in about 2 weeks in the office.  For all other surgeries, there are sticky tapes (steri-strips) on your wounds and all the stitches are absorbable.  Leave the steri-strips in place when changing your dressings, they will peel off with time, usually 2-3 weeks.  Activity:  Increase activity slowly as tolerated, but follow the weight bearing instructions below.  The rules on driving is that you can not be taking narcotics while you drive, and you must feel in control of the vehicle.    Weight Bearing:   Non weight bearing right shoulder  To prevent constipation: you may use a stool softener such as -  Colace (over the counter) 100 mg by mouth twice a day  Drink plenty of fluids (prune juice may be helpful) and high fiber foods Miralax (over the counter) for constipation as needed.    Itching:  If you experience itching with your medications, try taking only a single pain pill, or even half a pain pill at a time.  You may take up to 10 pain pills per day, and you can also use benadryl over the counter for itching or also to help with sleep.   Precautions:  If you experience chest pain or shortness of breath - call 911 immediately for transfer to the hospital emergency department!!  If you develop a fever greater that 101 F, purulent drainage from wound, increased redness or drainage from wound, or calf pain --  Call the office at [phone]                                                Follow- Up Appointment:  Please call for an appointment to be seen in 2 weeks [place] - [phone]

## 2020-11-28 NOTE — Transfer of Care (Signed)
Immediate Anesthesia Transfer of Care Note  Patient: Shirley Woods  Procedure(s) Performed: REVERSE SHOULDER ARTHROPLASTY (Right: Shoulder)  Patient Location: PACU  Anesthesia Type:General  Level of Consciousness: awake, alert , oriented and patient cooperative  Airway & Oxygen Therapy: Patient Spontanous Breathing and Patient connected to face mask oxygen  Post-op Assessment: Report given to RN and Post -op Vital signs reviewed and stable  Post vital signs: Reviewed and stable  Last Vitals:  Vitals Value Taken Time  BP 159/69 11/28/20 1341  Temp 36.9 C 11/28/20 1341  Pulse 77 11/28/20 1343  Resp 23 11/28/20 1343  SpO2 99 % 11/28/20 1343  Vitals shown include unvalidated device data.  Last Pain:  Vitals:   11/28/20 0834  TempSrc:   PainSc: 0-No pain         Complications: No notable events documented.

## 2020-11-28 NOTE — Anesthesia Procedure Notes (Signed)
Anesthesia Regional Block: Interscalene brachial plexus block   Pre-Anesthetic Checklist: , timeout performed,  Correct Patient, Correct Site, Correct Laterality,  Correct Procedure, Correct Position, site marked,  Risks and benefits discussed,  Surgical consent,  Pre-op evaluation,  At surgeon's request and post-op pain management  Laterality: Right  Prep: chloraprep       Needles:  Injection technique: Single-shot  Needle Type: Echogenic Needle     Needle Length: 5cm  Needle Gauge: 21     Additional Needles:   Narrative:  Start time: 11/28/2020 9:49 AM End time: 11/28/2020 9:52 AM Injection made incrementally with aspirations every 5 mL.  Performed by: Personally  Anesthesiologist: Audry Pili, MD  Additional Notes: No pain on injection. No increased resistance to injection. Injection made in 5cc increments. Good needle visualization. Patient tolerated the procedure well.

## 2020-11-28 NOTE — Progress Notes (Signed)
Assisted Dr. Brock with right, ultrasound guided, interscalene  block. Side rails up, monitors on throughout procedure. See vital signs in flow sheet. Tolerated Procedure well.  

## 2020-11-28 NOTE — Anesthesia Postprocedure Evaluation (Signed)
Anesthesia Post Note  Patient: Shirley Woods  Procedure(s) Performed: REVERSE SHOULDER ARTHROPLASTY (Right: Shoulder)     Patient location during evaluation: PACU Anesthesia Type: General Level of consciousness: awake and alert Pain management: pain level controlled Vital Signs Assessment: post-procedure vital signs reviewed and stable Respiratory status: spontaneous breathing, nonlabored ventilation, respiratory function stable and patient connected to nasal cannula oxygen Cardiovascular status: blood pressure returned to baseline and stable Postop Assessment: no apparent nausea or vomiting Anesthetic complications: no   No notable events documented.  Last Vitals:  Vitals:   11/28/20 1540 11/28/20 1542  BP: (!) 144/85 (!) 144/85  Pulse: 85 80  Resp: 18 14  Temp: 36.9 C   SpO2: 93% 95%    Last Pain:  Vitals:   11/28/20 0834  TempSrc:   PainSc: 0-No pain                 Audry Pili

## 2020-11-30 ENCOUNTER — Encounter (HOSPITAL_COMMUNITY): Payer: Self-pay | Admitting: Orthopedic Surgery

## 2020-12-11 DIAGNOSIS — M19011 Primary osteoarthritis, right shoulder: Secondary | ICD-10-CM | POA: Diagnosis not present

## 2021-01-12 DIAGNOSIS — M6281 Muscle weakness (generalized): Secondary | ICD-10-CM | POA: Diagnosis not present

## 2021-01-12 DIAGNOSIS — Z96611 Presence of right artificial shoulder joint: Secondary | ICD-10-CM | POA: Diagnosis not present

## 2021-01-18 DIAGNOSIS — Z96611 Presence of right artificial shoulder joint: Secondary | ICD-10-CM | POA: Diagnosis not present

## 2021-01-18 DIAGNOSIS — M6281 Muscle weakness (generalized): Secondary | ICD-10-CM | POA: Diagnosis not present

## 2021-01-23 DIAGNOSIS — Z96611 Presence of right artificial shoulder joint: Secondary | ICD-10-CM | POA: Diagnosis not present

## 2021-01-23 DIAGNOSIS — M6281 Muscle weakness (generalized): Secondary | ICD-10-CM | POA: Diagnosis not present

## 2021-01-25 DIAGNOSIS — M6281 Muscle weakness (generalized): Secondary | ICD-10-CM | POA: Diagnosis not present

## 2021-01-25 DIAGNOSIS — Z96611 Presence of right artificial shoulder joint: Secondary | ICD-10-CM | POA: Diagnosis not present

## 2021-01-30 DIAGNOSIS — M6281 Muscle weakness (generalized): Secondary | ICD-10-CM | POA: Diagnosis not present

## 2021-01-30 DIAGNOSIS — Z96611 Presence of right artificial shoulder joint: Secondary | ICD-10-CM | POA: Diagnosis not present

## 2021-02-01 DIAGNOSIS — Z96611 Presence of right artificial shoulder joint: Secondary | ICD-10-CM | POA: Diagnosis not present

## 2021-02-01 DIAGNOSIS — M6281 Muscle weakness (generalized): Secondary | ICD-10-CM | POA: Diagnosis not present

## 2021-02-06 DIAGNOSIS — Z96611 Presence of right artificial shoulder joint: Secondary | ICD-10-CM | POA: Diagnosis not present

## 2021-02-06 DIAGNOSIS — M6281 Muscle weakness (generalized): Secondary | ICD-10-CM | POA: Diagnosis not present

## 2021-02-07 DIAGNOSIS — M19011 Primary osteoarthritis, right shoulder: Secondary | ICD-10-CM | POA: Diagnosis not present

## 2021-02-08 DIAGNOSIS — M6281 Muscle weakness (generalized): Secondary | ICD-10-CM | POA: Diagnosis not present

## 2021-02-08 DIAGNOSIS — Z96611 Presence of right artificial shoulder joint: Secondary | ICD-10-CM | POA: Diagnosis not present

## 2021-02-13 DIAGNOSIS — M6281 Muscle weakness (generalized): Secondary | ICD-10-CM | POA: Diagnosis not present

## 2021-02-13 DIAGNOSIS — Z96611 Presence of right artificial shoulder joint: Secondary | ICD-10-CM | POA: Diagnosis not present

## 2021-02-15 DIAGNOSIS — Z96611 Presence of right artificial shoulder joint: Secondary | ICD-10-CM | POA: Diagnosis not present

## 2021-02-15 DIAGNOSIS — M6281 Muscle weakness (generalized): Secondary | ICD-10-CM | POA: Diagnosis not present

## 2021-02-20 DIAGNOSIS — M6281 Muscle weakness (generalized): Secondary | ICD-10-CM | POA: Diagnosis not present

## 2021-02-20 DIAGNOSIS — Z96611 Presence of right artificial shoulder joint: Secondary | ICD-10-CM | POA: Diagnosis not present

## 2021-02-22 DIAGNOSIS — M6281 Muscle weakness (generalized): Secondary | ICD-10-CM | POA: Diagnosis not present

## 2021-02-22 DIAGNOSIS — Z96611 Presence of right artificial shoulder joint: Secondary | ICD-10-CM | POA: Diagnosis not present

## 2021-02-27 DIAGNOSIS — Z96611 Presence of right artificial shoulder joint: Secondary | ICD-10-CM | POA: Diagnosis not present

## 2021-02-27 DIAGNOSIS — M6281 Muscle weakness (generalized): Secondary | ICD-10-CM | POA: Diagnosis not present

## 2021-03-01 DIAGNOSIS — M6281 Muscle weakness (generalized): Secondary | ICD-10-CM | POA: Diagnosis not present

## 2021-03-01 DIAGNOSIS — Z96611 Presence of right artificial shoulder joint: Secondary | ICD-10-CM | POA: Diagnosis not present

## 2021-03-05 DIAGNOSIS — H10411 Chronic giant papillary conjunctivitis, right eye: Secondary | ICD-10-CM | POA: Diagnosis not present

## 2021-03-06 DIAGNOSIS — Z96611 Presence of right artificial shoulder joint: Secondary | ICD-10-CM | POA: Diagnosis not present

## 2021-03-06 DIAGNOSIS — M6281 Muscle weakness (generalized): Secondary | ICD-10-CM | POA: Diagnosis not present

## 2021-03-20 DIAGNOSIS — I10 Essential (primary) hypertension: Secondary | ICD-10-CM | POA: Diagnosis not present

## 2021-04-11 DIAGNOSIS — T1511XA Foreign body in conjunctival sac, right eye, initial encounter: Secondary | ICD-10-CM | POA: Diagnosis not present

## 2021-05-09 DIAGNOSIS — M19011 Primary osteoarthritis, right shoulder: Secondary | ICD-10-CM | POA: Diagnosis not present

## 2021-06-11 DIAGNOSIS — M19011 Primary osteoarthritis, right shoulder: Secondary | ICD-10-CM | POA: Diagnosis not present

## 2021-06-14 DIAGNOSIS — I10 Essential (primary) hypertension: Secondary | ICD-10-CM | POA: Diagnosis not present

## 2021-06-14 DIAGNOSIS — G4700 Insomnia, unspecified: Secondary | ICD-10-CM | POA: Diagnosis not present

## 2021-06-14 DIAGNOSIS — E785 Hyperlipidemia, unspecified: Secondary | ICD-10-CM | POA: Diagnosis not present

## 2021-06-14 DIAGNOSIS — R69 Illness, unspecified: Secondary | ICD-10-CM | POA: Diagnosis not present

## 2021-06-20 DIAGNOSIS — E119 Type 2 diabetes mellitus without complications: Secondary | ICD-10-CM | POA: Diagnosis not present

## 2021-06-20 DIAGNOSIS — E569 Vitamin deficiency, unspecified: Secondary | ICD-10-CM | POA: Diagnosis not present

## 2021-06-21 DIAGNOSIS — H353120 Nonexudative age-related macular degeneration, left eye, stage unspecified: Secondary | ICD-10-CM | POA: Diagnosis not present

## 2021-06-21 DIAGNOSIS — H04123 Dry eye syndrome of bilateral lacrimal glands: Secondary | ICD-10-CM | POA: Diagnosis not present

## 2021-06-22 DIAGNOSIS — N390 Urinary tract infection, site not specified: Secondary | ICD-10-CM | POA: Diagnosis not present

## 2021-07-11 DIAGNOSIS — Z0100 Encounter for examination of eyes and vision without abnormal findings: Secondary | ICD-10-CM | POA: Diagnosis not present

## 2021-07-31 DIAGNOSIS — H02052 Trichiasis without entropian right lower eyelid: Secondary | ICD-10-CM | POA: Diagnosis not present

## 2021-07-31 DIAGNOSIS — H353131 Nonexudative age-related macular degeneration, bilateral, early dry stage: Secondary | ICD-10-CM | POA: Diagnosis not present

## 2021-07-31 DIAGNOSIS — H04123 Dry eye syndrome of bilateral lacrimal glands: Secondary | ICD-10-CM | POA: Diagnosis not present

## 2021-09-24 DIAGNOSIS — R69 Illness, unspecified: Secondary | ICD-10-CM | POA: Diagnosis not present

## 2021-09-24 DIAGNOSIS — Z1331 Encounter for screening for depression: Secondary | ICD-10-CM | POA: Diagnosis not present

## 2021-09-24 DIAGNOSIS — Z79899 Other long term (current) drug therapy: Secondary | ICD-10-CM | POA: Diagnosis not present

## 2021-09-24 DIAGNOSIS — Z0000 Encounter for general adult medical examination without abnormal findings: Secondary | ICD-10-CM | POA: Diagnosis not present

## 2021-09-24 DIAGNOSIS — Z23 Encounter for immunization: Secondary | ICD-10-CM | POA: Diagnosis not present

## 2021-09-24 DIAGNOSIS — I10 Essential (primary) hypertension: Secondary | ICD-10-CM | POA: Diagnosis not present

## 2021-09-24 DIAGNOSIS — M25562 Pain in left knee: Secondary | ICD-10-CM | POA: Diagnosis not present

## 2021-09-25 DIAGNOSIS — H02052 Trichiasis without entropian right lower eyelid: Secondary | ICD-10-CM | POA: Diagnosis not present

## 2021-10-01 DIAGNOSIS — Z8249 Family history of ischemic heart disease and other diseases of the circulatory system: Secondary | ICD-10-CM | POA: Diagnosis not present

## 2021-10-01 DIAGNOSIS — Z008 Encounter for other general examination: Secondary | ICD-10-CM | POA: Diagnosis not present

## 2021-10-01 DIAGNOSIS — R69 Illness, unspecified: Secondary | ICD-10-CM | POA: Diagnosis not present

## 2021-10-01 DIAGNOSIS — F411 Generalized anxiety disorder: Secondary | ICD-10-CM | POA: Diagnosis not present

## 2021-10-01 DIAGNOSIS — I69318 Other symptoms and signs involving cognitive functions following cerebral infarction: Secondary | ICD-10-CM | POA: Diagnosis not present

## 2021-10-01 DIAGNOSIS — I10 Essential (primary) hypertension: Secondary | ICD-10-CM | POA: Diagnosis not present

## 2021-10-01 DIAGNOSIS — Z803 Family history of malignant neoplasm of breast: Secondary | ICD-10-CM | POA: Diagnosis not present

## 2021-10-01 DIAGNOSIS — K5900 Constipation, unspecified: Secondary | ICD-10-CM | POA: Diagnosis not present

## 2021-11-05 ENCOUNTER — Ambulatory Visit (INDEPENDENT_AMBULATORY_CARE_PROVIDER_SITE_OTHER): Payer: Medicare HMO | Admitting: Podiatry

## 2021-11-05 ENCOUNTER — Ambulatory Visit (INDEPENDENT_AMBULATORY_CARE_PROVIDER_SITE_OTHER): Payer: Medicare HMO

## 2021-11-05 DIAGNOSIS — M79671 Pain in right foot: Secondary | ICD-10-CM

## 2021-11-05 DIAGNOSIS — M779 Enthesopathy, unspecified: Secondary | ICD-10-CM | POA: Diagnosis not present

## 2021-11-06 DIAGNOSIS — H02052 Trichiasis without entropian right lower eyelid: Secondary | ICD-10-CM | POA: Diagnosis not present

## 2021-11-07 ENCOUNTER — Telehealth: Payer: Self-pay | Admitting: Urology

## 2021-11-07 NOTE — Telephone Encounter (Signed)
DOS - 11/21/21  TARSAL EXOSTECTOMY RIGHT --- 93570  [employer] EFFECTIVE DATE - 05/13/20  PLAN DEDUCTIBLE - $0.00 OUT OF POCKET - $4,500.00 W/ $1,779.39 REMAINING COINSURANCE - 0% COPAY - $200.00  SPOKE WITH DAMON F. WITH [employer] MEDICARE AND HE STATED THAT FOR CPT CODE 03009 NO PRIOR AUTH IS REQUIRED.   REF # [id number]

## 2021-11-12 NOTE — Progress Notes (Signed)
Subjective:   Patient ID: Shirley Woods, female   DOB: 81 y.o.   MRN: [id number]   HPI 81 year old female presents the office today with her husband for concerns of a painful bump on the top of her right foot.  This been ongoing for 5 years but seems to be getting worse and is hurting with wearing shoes and this is limiting her activity.  She tries to change shoes based on the pain.  No other treatment.  No injuries that she reports.  She has no other concerns.   Review of Systems  All other systems reviewed and are negative.  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 (HCC)    Short term memory loss   Family history of adverse reaction to anesthesia    Hypertension     Past Surgical History:  Procedure Laterality Date   ABDOMINAL HYSTERECTOMY     LAPAROSCOPIC APPENDECTOMY N/A 07/29/2017   Procedure: APPENDECTOMY LAPAROSCOPIC;  Surgeon: Georganna Skeans, MD;  Location: [place];  Service: General;  Laterality: N/A;   REVERSE SHOULDER ARTHROPLASTY Right 11/28/2020   Procedure: REVERSE SHOULDER ARTHROPLASTY;  Surgeon: Marchia Bond, MD;  Location: [facility];  Service: Orthopedics;  Laterality: Right;     Current Outpatient Medications:    amLODipine (NORVASC) 2.5 MG tablet, Take 2.5 mg by mouth daily., Disp: , Rfl:    Carboxymethylcellulose Sodium 1 % GEL, Place 1 application into both eyes daily as needed (dry eyes)., Disp: , Rfl:    Cholecalciferol (VITAMIN D) 50 MCG (2000 UT) tablet, Take 2,000 Units by mouth daily., Disp: , Rfl:    Cyanocobalamin (B-12 PO), Take 1 capsule by mouth every other day., Disp: , Rfl:    LORazepam (ATIVAN) 1 MG tablet, Take 1.5 mg by mouth at bedtime., Disp: , Rfl:    Multiple Vitamin (MULTIVITAMIN WITH MINERALS) TABS tablet, Take 1 tablet by mouth daily., Disp: , Rfl:    Omega-3 Fatty Acids (FISH OIL PO), Take 1 capsule by mouth 2 (two) times daily., Disp: , Rfl:    ondansetron (ZOFRAN)  4 MG tablet, Take 1 tablet (4 mg total) by mouth every 8 (eight) hours as needed for nausea or vomiting., Disp: 10 tablet, Rfl: 0   OVER THE COUNTER MEDICATION, Take 3 tablets by mouth 2 (two) times daily. algea cal, Disp: , Rfl:    OVER THE COUNTER MEDICATION, Take 2 tablets by mouth every evening. strontium boost (at least 2 hours after the 2nd dose of algea cal), Disp: , Rfl:    OVER THE COUNTER MEDICATION, Place 1 patch onto the skin at bedtime. Zleep otc sleep patches, Disp: , Rfl:    oxyCODONE (ROXICODONE) 5 MG immediate release tablet, Take 1 tablet (5 mg total) by mouth every 6 (six) hours as needed for severe pain., Disp: 30 tablet, Rfl: 0   Plant Sterols and Stanols (CHOLEST OFF PO), Take 2 tablets by mouth 2 (two) times daily., Disp: , Rfl:    sennosides-docusate sodium (SENOKOT-S) 8.6-50 MG tablet, Take 2 tablets by mouth daily., Disp: 30 tablet, Rfl: 1   sertraline (ZOLOFT) 100 MG tablet, Take 100 mg by mouth daily. , Disp: , Rfl:    zinc gluconate 50 MG tablet, Take 50 mg by mouth daily., Disp: , Rfl:   No Known Allergies        Objective:  Physical Exam  General: AAO x3, NAD  Dermatological: Skin is warm, dry and  supple bilateral.  There are no open sores, no preulcerative lesions, no rash or signs of infection present.  Vascular: Dorsalis Pedis artery and Posterior Tibial artery pedal pulses are 2/4 bilateral with immedate capillary fill time.There is no pain with calf compression, swelling, warmth, erythema.   Neruologic: Grossly intact via light touch bilateral.   Musculoskeletal: Digital contracture present.  On the dorsal medial aspect of the foot is a prominent bone spur which is causing discomfort.  There is no edema, erythema or skin breakdown.  Muscular strength 5/5 in all groups tested bilateral.  Gait: Unassisted, Nonantalgic.       Assessment:   Symptomatic dorsal bone spur     Plan:  -Treatment options discussed including all alternatives, risks, and  complications -Etiology of symptoms were discussed -X-rays were obtained and reviewed with the patient.  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  Spurring present at the dorsal medial midfoot. -We discussed both conservative as well as surgical treatment options.  Conservative discussed continued shoe modifications, offloading padding.  Surgically we discussed exostectomy.  She wants to proceed with surgery.  Discussed exostectomy. -The incision placement as well as the postoperative course was discussed with the patient. I discussed risks of the surgery which include, but not limited to, infection, bleeding, pain, swelling, need for further surgery, delayed or nonhealing, painful or ugly scar, numbness or sensation changes, recurrence, transfer lesions, further deformity, hardware failure, DVT/PE, loss of toe/foot. Patient understands these risks and wishes to proceed with surgery. The surgical consent was reviewed with the patient all 3 pages were signed. No promises or guarantees were given to the outcome of the procedure. All questions were answered to the best of my ability. Before the surgery the patient was encouraged to call the office if there is any further questions. The surgery will be performed at the [facility] on an outpatient basis.  Trula Slade DPM

## 2021-11-14 ENCOUNTER — Encounter: Payer: Self-pay | Admitting: Podiatry

## 2021-11-21 ENCOUNTER — Other Ambulatory Visit: Payer: Self-pay | Admitting: Podiatry

## 2021-11-21 DIAGNOSIS — M25774 Osteophyte, right foot: Secondary | ICD-10-CM | POA: Diagnosis not present

## 2021-11-21 MED ORDER — HYDROCODONE-ACETAMINOPHEN 5-325 MG PO TABS: 1.0000 | ORAL_TABLET | Freq: Four times a day (QID) | ORAL | 0 refills | Status: DC | PRN

## 2021-11-21 MED ORDER — CEPHALEXIN 500 MG PO CAPS: 500.0000 mg | ORAL_CAPSULE | Freq: Three times a day (TID) | ORAL | 0 refills | Status: DC

## 2021-11-21 MED ORDER — ONDANSETRON HCL 4 MG PO TABS: 4.0000 mg | ORAL_TABLET | Freq: Three times a day (TID) | ORAL | 0 refills | Status: DC | PRN

## 2021-11-21 NOTE — Progress Notes (Signed)
Postop medications sent 

## 2021-11-26 ENCOUNTER — Ambulatory Visit (INDEPENDENT_AMBULATORY_CARE_PROVIDER_SITE_OTHER): Payer: Medicare HMO | Admitting: Podiatry

## 2021-11-26 ENCOUNTER — Ambulatory Visit (INDEPENDENT_AMBULATORY_CARE_PROVIDER_SITE_OTHER): Payer: Medicare HMO

## 2021-11-26 ENCOUNTER — Ambulatory Visit: Payer: Medicare HMO

## 2021-11-26 DIAGNOSIS — Z09 Encounter for follow-up examination after completed treatment for conditions other than malignant neoplasm: Secondary | ICD-10-CM

## 2021-11-26 NOTE — Progress Notes (Signed)
Patient seen today in office for post op visit number 1. Date of surgery was on 11/21/2021 for right foot excision of bone spur from top of foot. Xrays obtained. Patient denies any c/v/n/f/cp at this time. Denies any bleeding. Patient states her pain is minimal. Stitched intact. Provider assessed patient incision site. Provider ordered to dress foot with DSD and for patient to continue using surgical shoe.  Advised patient to monitor for signs and symptoms of infection and to call the office or go to the ED for any signs of infection. Patient understood.

## 2021-12-03 DIAGNOSIS — M19011 Primary osteoarthritis, right shoulder: Secondary | ICD-10-CM | POA: Diagnosis not present

## 2021-12-04 DIAGNOSIS — I10 Essential (primary) hypertension: Secondary | ICD-10-CM | POA: Diagnosis not present

## 2021-12-04 DIAGNOSIS — M8580 Other specified disorders of bone density and structure, unspecified site: Secondary | ICD-10-CM | POA: Diagnosis not present

## 2021-12-04 DIAGNOSIS — R69 Illness, unspecified: Secondary | ICD-10-CM | POA: Diagnosis not present

## 2021-12-04 DIAGNOSIS — Z780 Asymptomatic menopausal state: Secondary | ICD-10-CM | POA: Diagnosis not present

## 2021-12-06 ENCOUNTER — Ambulatory Visit (INDEPENDENT_AMBULATORY_CARE_PROVIDER_SITE_OTHER): Payer: Medicare HMO | Admitting: Podiatry

## 2021-12-06 DIAGNOSIS — Z09 Encounter for follow-up examination after completed treatment for conditions other than malignant neoplasm: Secondary | ICD-10-CM

## 2021-12-06 NOTE — Progress Notes (Signed)
Patient seen today in office for post op visit number 2. Date of surgery was on 11/21/2021 for right foot excision of bone spur from top of foot. Patient denies any c/v/n/f/cp at this time. Denies any bleeding. Denies any pain. Stitches were removed today. Provider assessed patient incision site. Steri strips applied to incision site and covered incision with an adhesive dressing and secured with tape.   Advised patient to monitor for signs and symptoms of infection and to call the office or go to the ED for any signs of infection. Patient understood.

## 2021-12-20 ENCOUNTER — Ambulatory Visit (INDEPENDENT_AMBULATORY_CARE_PROVIDER_SITE_OTHER): Payer: Medicare HMO | Admitting: Podiatry

## 2021-12-20 DIAGNOSIS — Z09 Encounter for follow-up examination after completed treatment for conditions other than malignant neoplasm: Secondary | ICD-10-CM

## 2021-12-20 DIAGNOSIS — M779 Enthesopathy, unspecified: Secondary | ICD-10-CM

## 2021-12-20 NOTE — Progress Notes (Signed)
Subjective: Chief Complaint  Patient presents with   Routine Post Op     RIGHT FOOT EXCISIONOF BONE SPUR FROM TOP OF FOOT, SLIGHT SWELLING ON MIDFOOT BUT NO PAIN    Shirley Woods is a 81 y.o. is seen today in office s/p left foot exostectomy preformed on 11/21/2021. States that she is doing well.  No significant pain.  Still some swelling.  She is wearing a regular sandal.  Denies any fevers or chills.  No other concerns.   Objective: General: No acute distress, AAOx3  DP/PT pulses palpable 2/4, CRT < 3 sec to all digits.  Protective sensation intact. Motor function intact.  Left foot: Incision is well coapted without any evidence of dehiscence.  1 small scab at the distal portion of the incision otherwise the scar is completely healed.  There is no surrounding erythema, ascending cellulitis, fluctuance, crepitus, malodor, drainage/purulence. There is minimal edema around the surgical site. There is no significant pain along the surgical site.  No other areas of tenderness to bilateral lower extremities.  No other open lesions or pre-ulcerative lesions.  No pain with calf compression, swelling, warmth, erythema.   Assessment and Plan:  Status post exostectomy, doing well with no complications   -Treatment options discussed including all alternatives, risks, and complications -Patient appears to be healing well with any signs of infection.  1 small scab is still present.  Recommended antibiotic ointment and dressing daily.  She is already back to her regular shoe gear.  Gradually increase activity level as tolerated as well as different shoes.  Unfortunate she still has some prominence present but this is due to the arthritis with the bone spur that she had prior to surgery are resolved. -Monitor for any clinical signs or symptoms of infection and DVT/PE and directed to call the office immediately should any occur or go to the ER. -At this point since she is doing well we will discharge her in  the postoperative course however she needs anything at all to let me know.  She also has my personal cell phone number if she needs something.  Celesta Gentile, DPM

## 2022-01-08 DIAGNOSIS — I10 Essential (primary) hypertension: Secondary | ICD-10-CM | POA: Diagnosis not present

## 2022-01-08 DIAGNOSIS — M810 Age-related osteoporosis without current pathological fracture: Secondary | ICD-10-CM | POA: Diagnosis not present

## 2022-01-08 DIAGNOSIS — Z1331 Encounter for screening for depression: Secondary | ICD-10-CM | POA: Diagnosis not present

## 2022-01-08 DIAGNOSIS — R69 Illness, unspecified: Secondary | ICD-10-CM | POA: Diagnosis not present

## 2022-01-08 DIAGNOSIS — G4700 Insomnia, unspecified: Secondary | ICD-10-CM | POA: Diagnosis not present

## 2022-01-08 DIAGNOSIS — F419 Anxiety disorder, unspecified: Secondary | ICD-10-CM | POA: Diagnosis not present

## 2022-01-08 DIAGNOSIS — Z131 Encounter for screening for diabetes mellitus: Secondary | ICD-10-CM | POA: Diagnosis not present

## 2022-01-08 DIAGNOSIS — Z0000 Encounter for general adult medical examination without abnormal findings: Secondary | ICD-10-CM | POA: Diagnosis not present

## 2022-01-08 DIAGNOSIS — Z6823 Body mass index (BMI) 23.0-23.9, adult: Secondary | ICD-10-CM | POA: Diagnosis not present

## 2022-01-17 ENCOUNTER — Other Ambulatory Visit (HOSPITAL_COMMUNITY): Payer: Self-pay

## 2022-01-17 ENCOUNTER — Other Ambulatory Visit: Payer: Self-pay

## 2022-01-17 DIAGNOSIS — M810 Age-related osteoporosis without current pathological fracture: Secondary | ICD-10-CM | POA: Insufficient documentation

## 2022-01-22 ENCOUNTER — Telehealth: Payer: Self-pay | Admitting: Pharmacy Technician

## 2022-01-22 ENCOUNTER — Other Ambulatory Visit: Payer: Self-pay

## 2022-01-22 NOTE — Telephone Encounter (Signed)
Auth Submission: NO AUTH NEEDED Payer: [employer] medicare Medication & CPT/J Code(s) submitted: Reclast (Zolendronic acid) [id number] Route of submission (phone, fax, portal): phone Phone # Fax # Auth type: Buy/Bill Units/visits requested: x1 dose Reference number:  Approval from: 01/22/22 to 05/12/22

## 2022-02-06 ENCOUNTER — Other Ambulatory Visit: Payer: Self-pay | Admitting: Podiatry

## 2022-02-06 DIAGNOSIS — M779 Enthesopathy, unspecified: Secondary | ICD-10-CM

## 2022-02-07 ENCOUNTER — Ambulatory Visit (INDEPENDENT_AMBULATORY_CARE_PROVIDER_SITE_OTHER): Payer: Medicare HMO

## 2022-02-07 ENCOUNTER — Ambulatory Visit (INDEPENDENT_AMBULATORY_CARE_PROVIDER_SITE_OTHER): Payer: Medicare HMO | Admitting: Podiatry

## 2022-02-07 DIAGNOSIS — M19071 Primary osteoarthritis, right ankle and foot: Secondary | ICD-10-CM

## 2022-02-07 DIAGNOSIS — M779 Enthesopathy, unspecified: Secondary | ICD-10-CM

## 2022-02-07 DIAGNOSIS — Z09 Encounter for follow-up examination after completed treatment for conditions other than malignant neoplasm: Secondary | ICD-10-CM

## 2022-02-07 NOTE — Patient Instructions (Signed)
You can use VOLTAREN GEL (ALSO CALLED DICLOFENAC GEL) on the foot to help with arthritis.

## 2022-02-08 IMAGING — CR DG KNEE 1-2V*L*
2 series · 2 of 2 positions shown · non-contrast
Comparison: None.

CLINICAL DATA: Injury of left knee 2-3 weeks ago.  Pain.

EXAM:
LEFT KNEE - 1-2 VIEW

[t knee ap left]
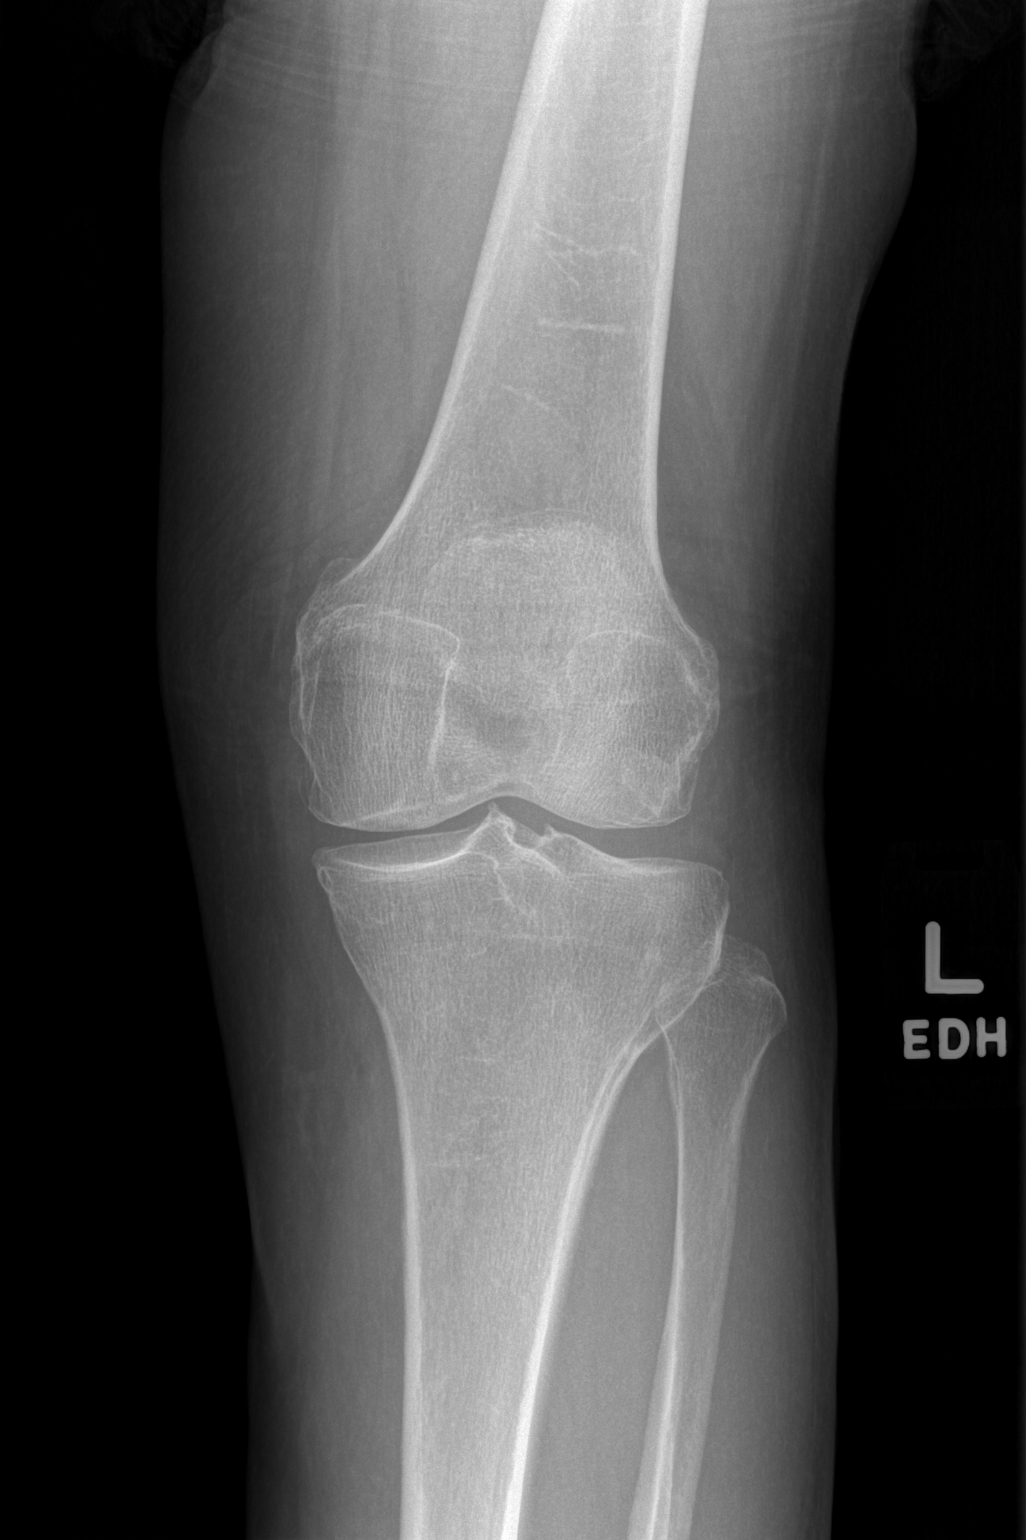

[t knee lat left]
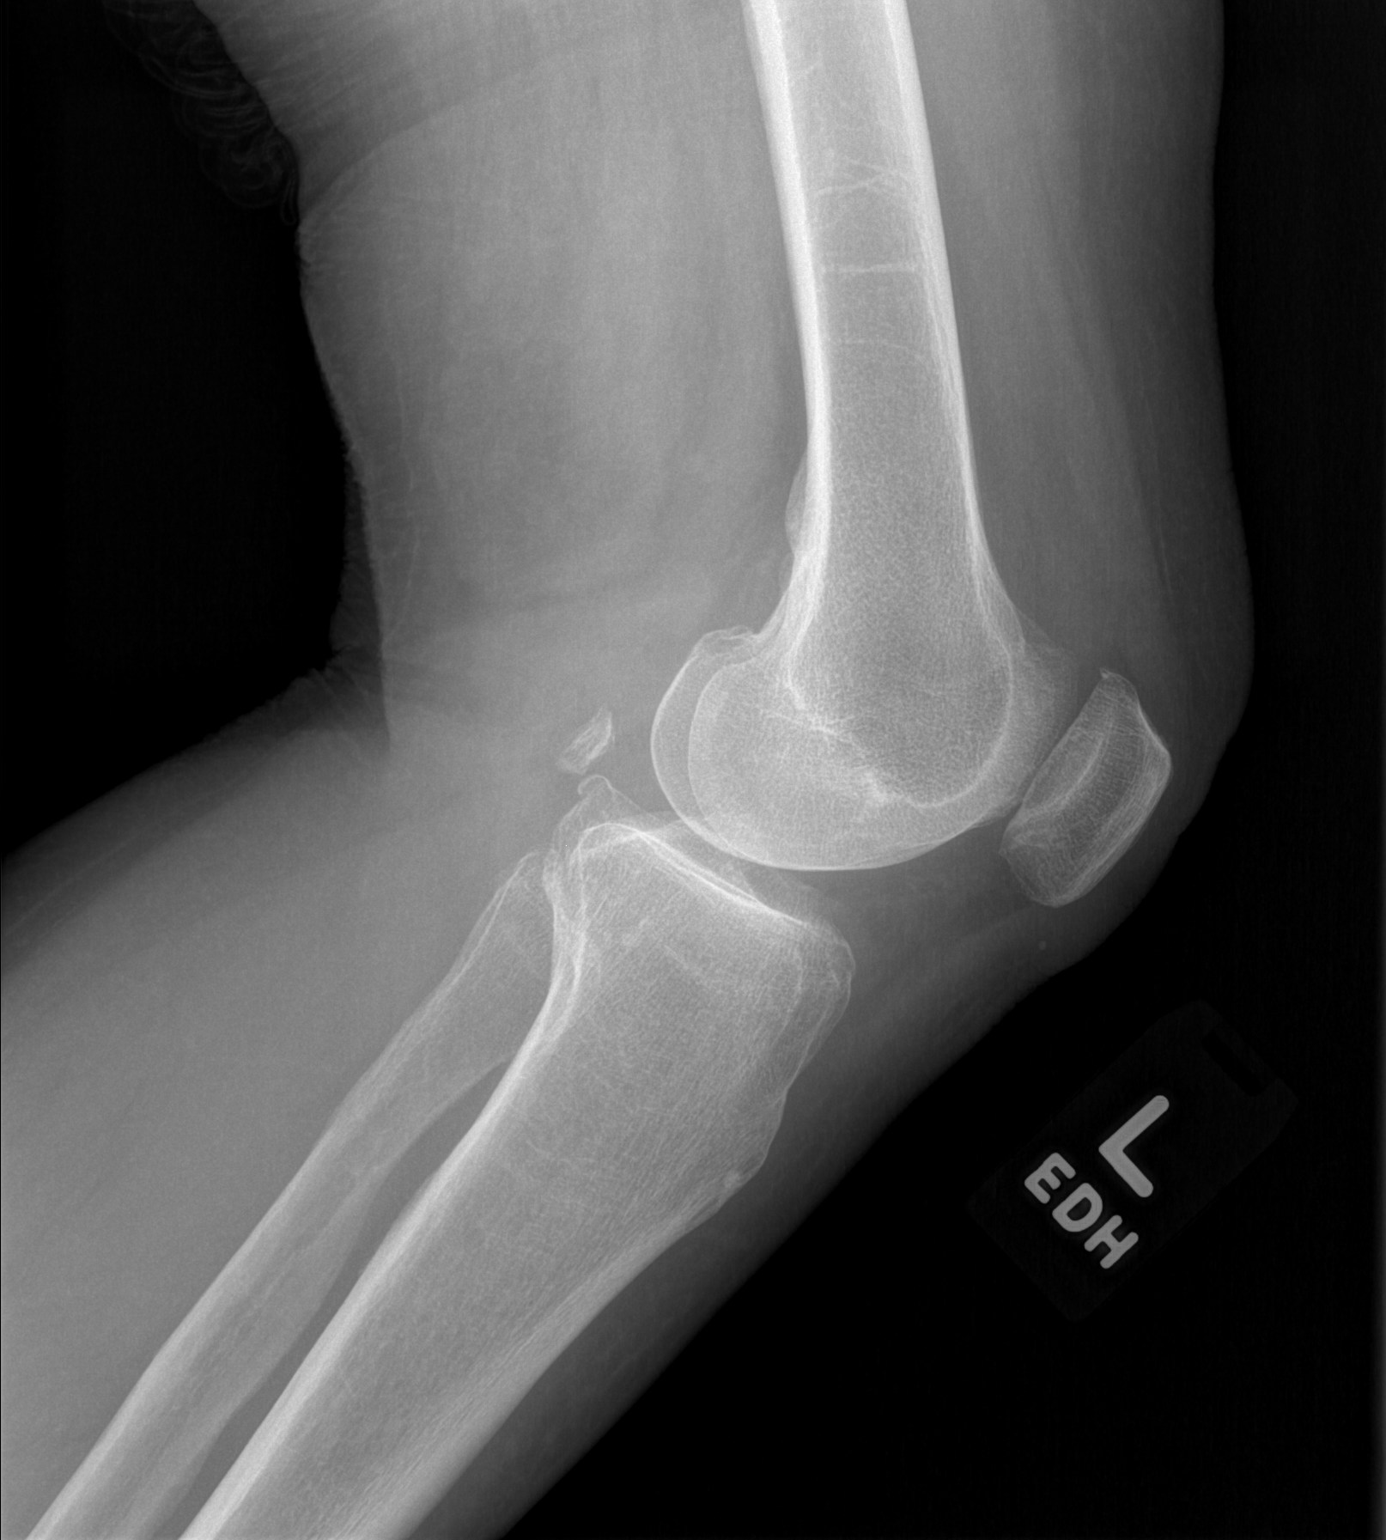

[2 of 2 positions shown; findings below may reference images not displayed]

FINDINGS: No evidence of fracture, dislocation, or joint effusion. No evidence
of arthropathy or other focal bone abnormality. Soft tissues are
unremarkable.
IMPRESSION: Negative.

## 2022-02-08 NOTE — Progress Notes (Signed)
Subjective: Chief Complaint  Patient presents with   Routine Post Op    POV #4 DOS 11/21/21 --- RIGHT FOOT EXCISIONOF BONE SPUR FROM TOP OF FOOT-Patient is doing great. Patient questioning why there is a lump to the top of her foot.     Shirley Woods is a 81 y.o. is seen today in office s/p right foot exostectomy preformed on 11/21/2021.  States that she is doing well.  She is wearing her shoes.  No fevers or chills.  No other concerns.    Objective: General: No acute distress, AAOx3  DP/PT pulses palpable 2/4, CRT < 3 sec to all digits.  Protective sensation intact. Motor function intact.  Left foot: Incision is well coapted without any evidence of dehiscence.  Incision well coapted.  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  No significant pain exam.  There is no erythema or warmth or signs of infection. No pain with calf compression, swelling, warmth, erythema.   Assessment and Plan:  Status post exostectomy, doing well with no complications   -Treatment options discussed including all alternatives, risks, and complications -X-rays were obtained and reviewed.  3 views of the left foot were obtained.  Arthritic changes present the first MPJ. -Discussed although the bone spurs are Ganji as well as arthritic changes present.  She does not want proceed with further surgery for this. -Overall continue to improve.  Continue regular shoe as tolerated and gradual increase activity as tolerated.  Continue ice, elevate as well as compression to help with any residual edema. -At this time we will discharge her from the postoperative course.  She agrees this plan.  I encouraged to call any questions or concerns or any changes.  Return if symptoms worsen or fail to improve.  Trula Slade DPM

## 2022-02-19 ENCOUNTER — Ambulatory Visit (INDEPENDENT_AMBULATORY_CARE_PROVIDER_SITE_OTHER): Payer: Medicare HMO

## 2022-02-19 VITALS — BP 134/67 | HR 64 | Temp 98.3°F | Resp 18 | Ht 62.0 in | Wt 125.2 lb

## 2022-02-19 DIAGNOSIS — M810 Age-related osteoporosis without current pathological fracture: Secondary | ICD-10-CM | POA: Diagnosis not present

## 2022-02-19 MED ORDER — SODIUM CHLORIDE 0.9 % IV SOLN: INTRAVENOUS | Status: DC

## 2022-02-19 MED ORDER — ACETAMINOPHEN 325 MG PO TABS
650.0000 mg | ORAL_TABLET | Freq: Once | ORAL | Status: AC
  Administered 2022-02-19: 650 mg via ORAL
  Filled 2022-02-19: qty 2

## 2022-02-19 MED ORDER — ZOLEDRONIC ACID 5 MG/100ML IV SOLN
5.0000 mg | Freq: Once | INTRAVENOUS | Status: AC
  Administered 2022-02-19: 5 mg via INTRAVENOUS
  Filled 2022-02-19: qty 100

## 2022-02-19 MED ORDER — DIPHENHYDRAMINE HCL 25 MG PO CAPS
25.0000 mg | ORAL_CAPSULE | Freq: Once | ORAL | Status: AC
  Administered 2022-02-19: 25 mg via ORAL
  Filled 2022-02-19: qty 1

## 2022-02-19 NOTE — Progress Notes (Signed)
Diagnosis: Osteoporosis  Provider:  Marshell Garfinkel MD  Procedure: Infusion  IV Type: Peripheral, IV Location: L Antecubital  Reclast (Zolendronic Acid), Dose: 5 mg  Infusion Start Time: 0937  Infusion Stop Time: 0447  Post Infusion IV Care: Observation period completed and Peripheral IV Discontinued  Discharge: Condition: Good, Destination: Home . AVS provided to patient.   Performed by:  Adelina Mings, LPN

## 2022-03-22 DIAGNOSIS — H02052 Trichiasis without entropian right lower eyelid: Secondary | ICD-10-CM | POA: Diagnosis not present

## 2022-04-16 ENCOUNTER — Ambulatory Visit (INDEPENDENT_AMBULATORY_CARE_PROVIDER_SITE_OTHER): Payer: Medicare HMO | Admitting: Internal Medicine

## 2022-04-16 ENCOUNTER — Encounter: Payer: Self-pay | Admitting: Internal Medicine

## 2022-04-16 VITALS — BP 130/78 | HR 81 | Temp 98.0°F | Resp 18 | Ht 62.0 in | Wt 127.8 lb

## 2022-04-16 DIAGNOSIS — M545 Low back pain, unspecified: Secondary | ICD-10-CM | POA: Diagnosis not present

## 2022-04-16 DIAGNOSIS — F5101 Primary insomnia: Secondary | ICD-10-CM | POA: Diagnosis not present

## 2022-04-16 DIAGNOSIS — R69 Illness, unspecified: Secondary | ICD-10-CM | POA: Diagnosis not present

## 2022-04-16 DIAGNOSIS — G5702 Lesion of sciatic nerve, left lower limb: Secondary | ICD-10-CM

## 2022-04-16 MED ORDER — CELECOXIB 200 MG PO CAPS: 200.0000 mg | ORAL_CAPSULE | Freq: Every day | ORAL | 0 refills | Status: DC

## 2022-04-16 MED ORDER — QUETIAPINE FUMARATE 25 MG PO TABS: 25.0000 mg | ORAL_TABLET | Freq: Every day | ORAL | 2 refills | Status: DC

## 2022-04-16 NOTE — Progress Notes (Signed)
   Established Patient Office Visit  Subjective   Patient ID: Shirley Woods, female    DOB: 14-Jul-1940  Age: 81 y.o. MRN: [id number]  Chief Complaint  Patient presents with   Back Pain    Right sided   Hip Pain    Right    HPI    ROS    Objective:     BP 130/78 (BP Location: Left Arm, Patient Position: Sitting, Cuff Size: Normal)   Pulse 81   Temp 98 F (36.7 C) (Temporal)   Resp 18   Ht '5\' 2"'$  (1.575 m)   Wt 127 lb 12.8 oz (58 kg)   SpO2 96%   BMI 23.37 kg/m    Physical Exam   No results found for any visits on 04/16/22.    The ASCVD Risk score (Arnett DK, et al., 2019) failed to calculate for the following reasons:   The 2019 ASCVD risk score is only valid for ages 30 to 77    Assessment & Plan:   Problem List Items Addressed This Visit       Nervous and Auditory   Piriformis syndrome of left side   Relevant Medications   busPIRone (BUSPAR) 5 MG tablet   QUEtiapine (SEROQUEL) 25 MG tablet     Other   Acute bilateral low back pain without sciatica - Primary   Relevant Medications   celecoxib (CELEBREX) 200 MG capsule   Primary insomnia    Return in about 4 weeks (around 05/14/2022).    Garwin Brothers, MD

## 2022-05-14 ENCOUNTER — Encounter: Payer: Self-pay | Admitting: Internal Medicine

## 2022-05-14 ENCOUNTER — Ambulatory Visit: Payer: Medicare HMO | Admitting: Internal Medicine

## 2022-05-14 VITALS — BP 140/80 | HR 76 | Temp 98.6°F | Resp 18 | Ht 66.0 in | Wt 125.4 lb

## 2022-05-14 DIAGNOSIS — F482 Pseudobulbar affect: Secondary | ICD-10-CM | POA: Insufficient documentation

## 2022-05-14 DIAGNOSIS — H04123 Dry eye syndrome of bilateral lacrimal glands: Secondary | ICD-10-CM | POA: Diagnosis not present

## 2022-05-14 DIAGNOSIS — F5101 Primary insomnia: Secondary | ICD-10-CM

## 2022-05-14 DIAGNOSIS — H02052 Trichiasis without entropian right lower eyelid: Secondary | ICD-10-CM | POA: Diagnosis not present

## 2022-05-14 DIAGNOSIS — R69 Illness, unspecified: Secondary | ICD-10-CM | POA: Diagnosis not present

## 2022-05-14 MED ORDER — DEXTROMETHORPHAN-QUINIDINE 20-10 MG PO CAPS: 1.0000 | ORAL_CAPSULE | Freq: Every day | ORAL | 4 refills | Status: DC

## 2022-05-14 MED ORDER — LORAZEPAM 1 MG PO TABS: 1.0000 mg | ORAL_TABLET | Freq: Every day | ORAL | 4 refills | Status: DC

## 2022-05-14 NOTE — Assessment & Plan Note (Signed)
She says she has no improvement on any medicine except lorazepam. Less than 1 mg does not help. So will start that medicine.

## 2022-05-14 NOTE — Assessment & Plan Note (Signed)
Will start on Nuedextra 20 mg daily for pseudobulbar effect.

## 2022-05-14 NOTE — Progress Notes (Signed)
   Office Visit  Subjective   Patient ID: Shirley Woods   DOB: November 21, 1940   Age: 82 y.o.   MRN: [id number]   Chief Complaint Chief Complaint  Patient presents with   Follow-up    4 week follow up and wants to talk about medications.     History of Present Illness 82 years old female is here for follow up.   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   HENT: Negative.    Respiratory: Negative.    Cardiovascular: Negative.        Objective:    Vitals BP (!) 140/80 (BP Location: Left Arm, Patient Position: Sitting, Cuff Size: Normal)   Pulse 76   Temp 98.6 F (37 C)   Resp 18   Ht '5\' 6"'$  (1.676 m)   Wt 125 lb 6 oz (56.9 kg)   SpO2 98%   BMI 20.24 kg/m    Physical Examination Physical Exam     Assessment & Plan:   Primary insomnia She says she has no improvement on any medicine except lorazepam. Less than 1 mg does not help. So will start that medicine.  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    Return in about 3 months (around 08/13/2022).   Garwin Brothers, MD

## 2022-06-12 DIAGNOSIS — Z711 Person with feared health complaint in whom no diagnosis is made: Secondary | ICD-10-CM | POA: Diagnosis not present

## 2022-06-12 DIAGNOSIS — H6991 Unspecified Eustachian tube disorder, right ear: Secondary | ICD-10-CM | POA: Diagnosis not present

## 2022-06-18 ENCOUNTER — Ambulatory Visit: Payer: Medicare HMO | Admitting: Internal Medicine

## 2022-06-18 VITALS — BP 138/80 | HR 107 | Temp 98.5°F | Resp 18 | Ht 66.0 in | Wt 125.8 lb

## 2022-06-18 DIAGNOSIS — H9190 Unspecified hearing loss, unspecified ear: Secondary | ICD-10-CM | POA: Insufficient documentation

## 2022-06-18 DIAGNOSIS — R6889 Other general symptoms and signs: Secondary | ICD-10-CM | POA: Diagnosis not present

## 2022-06-18 DIAGNOSIS — H906 Mixed conductive and sensorineural hearing loss, bilateral: Secondary | ICD-10-CM

## 2022-06-18 DIAGNOSIS — F482 Pseudobulbar affect: Secondary | ICD-10-CM | POA: Diagnosis not present

## 2022-06-18 DIAGNOSIS — R69 Illness, unspecified: Secondary | ICD-10-CM | POA: Diagnosis not present

## 2022-06-18 NOTE — Progress Notes (Addendum)
   Office Visit  Subjective   Patient ID: Shirley Woods   DOB: [birth year]   Age: 82 y.o.   MRN: [id number]   Chief Complaint Chief Complaint  Patient presents with   Hearing Problem    Right ear hearing loss;would liked referral to ENT     History of Present Illness 82 years old female who is hear for sudden hearing loss right ear few days ago, she has brain hemorrgahge few years ago and since then she started using hearing aid but lately stopped using it, as she was feeling better.  Her husband is worried that something has happened to her that she could not hear from right ear. She also started crying in the office, sand she has been doing it for a while. She is also getting forgetful.  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   HENT:  Positive for hearing loss.   Respiratory: Negative.    Cardiovascular: Negative.   Gastrointestinal: Negative.   Neurological: Negative.        Objective:    Vitals BP 138/80 (BP Location: Left Arm, Patient Position: Sitting, Cuff Size: Normal)   Pulse (!) 107   Temp 98.5 F (36.9 C) (Temporal)   Resp 18   Ht '5\' 6"'$  (1.676 m)   Wt 125 lb 12.8 oz (57.1 kg)   SpO2 98%   BMI 20.30 kg/m    Physical Examination Physical Exam Constitutional:      Appearance: Normal appearance.  HENT:     Head: Normocephalic and atraumatic.  Cardiovascular:     Rate and Rhythm: Normal rate and regular rhythm.     Heart sounds: Normal heart sounds.  Pulmonary:     Effort: Pulmonary effort is normal.     Breath sounds: Normal breath sounds.  Neurological:     General: No focal deficit present.     Mental Status: She is alert and oriented to person, place,  and time.        Assessment & Plan:   Hearing loss She will see hearing clinic for possible adjusting hearing aid. I will also do MRI brain to rule out stroke.  Forgetfulness I will do MMSE and depression screening on next visit  Pseudobulbar affect Insurance does not cover medicine    Return in about 2 weeks (around 07/02/2022).   Garwin Brothers, MD

## 2022-06-19 LAB — CMP14 + ANION GAP
ALT: 20 IU/L (ref 0–32)
AST: 19 IU/L (ref 0–40)
Albumin/Globulin Ratio: 1.9 (ref 1.2–2.2)
Albumin: 4.3 g/dL (ref 3.8–4.8)
Alkaline Phosphatase: 76 IU/L (ref 44–121)
Anion Gap: 14 mmol/L (ref 10.0–18.0)
BUN/Creatinine Ratio: 16 (ref 12–28)
BUN: 13 mg/dL (ref 8–27)
Bilirubin Total: 0.3 mg/dL (ref 0.0–1.2)
CO2: 23 mmol/L (ref 20–29)
Calcium: 9.9 mg/dL (ref 8.7–10.3)
Chloride: 104 mmol/L (ref 96–106)
Creatinine, Ser: 0.8 mg/dL (ref 0.57–1.00)
Globulin, Total: 2.3 g/dL (ref 1.5–4.5)
Glucose: 106 mg/dL — ABNORMAL HIGH (ref 70–99)
Potassium: 4.6 mmol/L (ref 3.5–5.2)
Sodium: 141 mmol/L (ref 134–144)
Total Protein: 6.6 g/dL (ref 6.0–8.5)
eGFR: 74 mL/min/{1.73_m2} (ref >59)

## 2022-06-19 LAB — TSH: TSH: 0.394 u[IU]/mL — ABNORMAL LOW (ref 0.450–4.500)

## 2022-06-19 LAB — CBC WITH DIFFERENTIAL/PLATELET
Basophils Absolute: 0 10*3/uL (ref 0.0–0.2)
Basos: 1 %
EOS (ABSOLUTE): 0.1 10*3/uL (ref 0.0–0.4)
Eos: 2 %
Hematocrit: 40.6 % (ref 34.0–46.6)
Hemoglobin: 14.1 g/dL (ref 11.1–15.9)
Immature Grans (Abs): 0 10*3/uL (ref 0.0–0.1)
Immature Granulocytes: 0 %
Lymphocytes Absolute: 1.3 10*3/uL (ref 0.7–3.1)
Lymphs: 27 %
MCH: 31.8 pg (ref 26.6–33.0)
MCHC: 34.7 g/dL (ref 31.5–35.7)
MCV: 92 fL (ref 79–97)
Monocytes Absolute: 0.5 10*3/uL (ref 0.1–0.9)
Monocytes: 9 %
Neutrophils Absolute: 3 10*3/uL (ref 1.4–7.0)
Neutrophils: 61 %
Platelets: 174 10*3/uL (ref 150–450)
RBC: 4.43 x10E6/uL (ref 3.77–5.28)
RDW: 12.7 % (ref 11.7–15.4)
WBC: 4.9 10*3/uL (ref 3.4–10.8)

## 2022-06-19 LAB — VITAMIN B12: Vitamin B-12: 1541 pg/mL — ABNORMAL HIGH (ref 232–1245)

## 2022-06-23 ENCOUNTER — Ambulatory Visit (HOSPITAL_COMMUNITY)
Admission: RE | Admit: 2022-06-23 | Discharge: 2022-06-23 | Disposition: A | Discharge status: Home / Self Care | Payer: Medicare HMO | Source: Ambulatory Visit | Attending: Internal Medicine | Admitting: Internal Medicine

## 2022-06-23 DIAGNOSIS — R413 Other amnesia: Secondary | ICD-10-CM | POA: Diagnosis not present

## 2022-06-23 DIAGNOSIS — R6889 Other general symptoms and signs: Secondary | ICD-10-CM | POA: Insufficient documentation

## 2022-06-23 DIAGNOSIS — G319 Degenerative disease of nervous system, unspecified: Secondary | ICD-10-CM | POA: Diagnosis not present

## 2022-06-23 MED ORDER — GADOBUTROL 1 MMOL/ML IV SOLN
5.5000 mL | Freq: Once | INTRAVENOUS | Status: AC | PRN
  Administered 2022-06-23: 5.5 mL via INTRAVENOUS

## 2022-06-25 NOTE — Assessment & Plan Note (Signed)
Insurance does not cover medicine

## 2022-06-25 NOTE — Assessment & Plan Note (Signed)
She will see hearing clinic for possible adjusting hearing aid. I will also do MRI brain to rule out stroke.

## 2022-06-25 NOTE — Assessment & Plan Note (Signed)
I will do MMSE and depression screening on next visit

## 2022-07-02 ENCOUNTER — Ambulatory Visit: Payer: Medicare HMO | Admitting: Internal Medicine

## 2022-07-02 ENCOUNTER — Encounter: Payer: Self-pay | Admitting: Internal Medicine

## 2022-07-02 VITALS — BP 126/80 | HR 96 | Temp 97.9°F | Resp 16 | Ht 66.0 in | Wt 127.2 lb

## 2022-07-02 DIAGNOSIS — G300 Alzheimer's disease with early onset: Secondary | ICD-10-CM | POA: Diagnosis not present

## 2022-07-02 DIAGNOSIS — R69 Illness, unspecified: Secondary | ICD-10-CM | POA: Diagnosis not present

## 2022-07-02 DIAGNOSIS — H9071 Mixed conductive and sensorineural hearing loss, unilateral, right ear, with unrestricted hearing on the contralateral side: Secondary | ICD-10-CM

## 2022-07-02 DIAGNOSIS — F02A0 Dementia in other diseases classified elsewhere, mild, without behavioral disturbance, psychotic disturbance, mood disturbance, and anxiety: Secondary | ICD-10-CM

## 2022-07-02 DIAGNOSIS — F03918 Unspecified dementia, unspecified severity, with other behavioral disturbance: Secondary | ICD-10-CM | POA: Insufficient documentation

## 2022-07-02 DIAGNOSIS — M7050 Other bursitis of knee, unspecified knee: Secondary | ICD-10-CM | POA: Diagnosis not present

## 2022-07-02 DIAGNOSIS — F482 Pseudobulbar affect: Secondary | ICD-10-CM

## 2022-07-02 DIAGNOSIS — F0390 Unspecified dementia without behavioral disturbance: Secondary | ICD-10-CM | POA: Insufficient documentation

## 2022-07-02 MED ORDER — DICLOFENAC SODIUM 1 % EX GEL: 2.0000 g | Freq: Four times a day (QID) | CUTANEOUS | 1 refills | Status: DC

## 2022-07-02 NOTE — Assessment & Plan Note (Signed)
I brought samples of Nuedextra 8 weeks of sample to see if that make any difference.

## 2022-07-02 NOTE — Assessment & Plan Note (Signed)
She will apply voltaren gel four times a day and if that did not help then she will come for injection.

## 2022-07-02 NOTE — Progress Notes (Signed)
   Office Visit  Subjective   Patient ID: Shirley Woods   DOB: 19-Jul-1940   Age: 82 y.o.   MRN: [id number]   Chief Complaint Chief Complaint  Patient presents with   Follow-up    2 week     History of Present Illness 82 years old female is here for follow up of increased forgetfullness. Per patient and her husband her memory is worse than a year ago. She ask same question again and again. Her friends also noted her forget fullness. She has history of intracranial bleed in the past and her MRI brain showed brain atrophy.  She has hard of hearing from right ear and se will see audiologist for hearing test. She also is c/o pain in her medial side of right knee for 1 month. It hurt when she walk. No injury or fall.  She also has pseudobulbar palsy and her insurance did not approve Nuedextra. She start crying in office last 2 visit. I brought samples for her to try to see if that help.  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   HENT:  Positive for hearing loss.   Respiratory: Negative.    Cardiovascular: Negative.   Musculoskeletal:        Medial side of right knee just below knee.       Objective:    Vitals BP 126/80 (BP Location: Left Arm, Patient Position: Sitting, Cuff Size: Normal)   Pulse 96   Temp 97.9 F (36.6 C) (Temporal)   Resp 16   Ht 5' 6"$  (1.676 m)   Wt 127 lb 3.2 oz (57.7 kg)   SpO2 99%   BMI 20.53 kg/m    Physical Examination Physical Exam Constitutional:      Appearance: Normal appearance.  HENT:     Left Ear: Tympanic membrane normal.  Cardiovascular:     Rate and Rhythm: Normal rate and regular rhythm.     Heart sounds: Normal heart sounds.   Pulmonary:     Effort: Pulmonary effort is normal.     Breath sounds: Normal breath sounds.  Musculoskeletal:     Comments: Tenderness right anserine bursa site  Neurological:     General: No focal deficit present.     Mental Status: She is alert and oriented to person, place, and time.        Assessment & Plan:   Anserine bursitis She will apply voltaren gel four times a day and if that did not help then she will come for injection.  Hearing loss She will see audiologist  Dementia [facility]) She score 19/30 in MMSE and depression screening was negative.  Pseudobulbar affect I brought samples of Nuedextra 8 weeks of sample to see if that make any difference.    No follow-ups on file.   Garwin Brothers, MD

## 2022-07-02 NOTE — Assessment & Plan Note (Signed)
She will see audiologist

## 2022-07-02 NOTE — Assessment & Plan Note (Signed)
She score 19/30 in MMSE and depression screening was negative.

## 2022-07-22 ENCOUNTER — Other Ambulatory Visit: Payer: Self-pay | Admitting: Internal Medicine

## 2022-07-23 ENCOUNTER — Telehealth: Payer: Self-pay | Admitting: Internal Medicine

## 2022-07-23 DIAGNOSIS — H9191 Unspecified hearing loss, right ear: Secondary | ICD-10-CM

## 2022-07-23 NOTE — Telephone Encounter (Signed)
Right ear hearing loss and hissing noise so will refer to ENT

## 2022-08-12 DIAGNOSIS — Z961 Presence of intraocular lens: Secondary | ICD-10-CM | POA: Diagnosis not present

## 2022-08-12 DIAGNOSIS — H52203 Unspecified astigmatism, bilateral: Secondary | ICD-10-CM | POA: Diagnosis not present

## 2022-08-12 DIAGNOSIS — H353131 Nonexudative age-related macular degeneration, bilateral, early dry stage: Secondary | ICD-10-CM | POA: Diagnosis not present

## 2022-08-13 ENCOUNTER — Encounter: Payer: Self-pay | Admitting: Internal Medicine

## 2022-08-13 ENCOUNTER — Ambulatory Visit: Payer: Medicare HMO | Admitting: Internal Medicine

## 2022-08-13 VITALS — BP 142/82 | HR 93 | Temp 97.7°F | Resp 18 | Ht 66.0 in | Wt 128.5 lb

## 2022-08-13 DIAGNOSIS — G300 Alzheimer's disease with early onset: Secondary | ICD-10-CM

## 2022-08-13 DIAGNOSIS — F02A0 Dementia in other diseases classified elsewhere, mild, without behavioral disturbance, psychotic disturbance, mood disturbance, and anxiety: Secondary | ICD-10-CM

## 2022-08-13 DIAGNOSIS — F482 Pseudobulbar affect: Secondary | ICD-10-CM

## 2022-08-13 DIAGNOSIS — I10 Essential (primary) hypertension: Secondary | ICD-10-CM

## 2022-08-13 DIAGNOSIS — R69 Illness, unspecified: Secondary | ICD-10-CM | POA: Diagnosis not present

## 2022-08-13 MED ORDER — AMLODIPINE BESYLATE 5 MG PO TABS: 5.0000 mg | ORAL_TABLET | Freq: Every day | ORAL | 3 refills | Status: DC

## 2022-08-13 NOTE — Assessment & Plan Note (Signed)
She has no episode of crying since she was started on medicine

## 2022-08-13 NOTE — Assessment & Plan Note (Signed)
Blood pressure has been fluctuating so will increase amlodipine to 5 mg daily

## 2022-08-13 NOTE — Progress Notes (Addendum)
   Office Visit  Subjective   Patient ID: Shirley Woods   DOB: 24-Aug-1940   Age: 82 y.o.   MRN: [id number]   Chief Complaint Chief Complaint  Patient presents with   Follow-up    Rt. 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   HENT:  Positive for hearing loss.   Respiratory: Negative.    Cardiovascular: Negative.   Gastrointestinal: Negative.   Neurological: Negative.        Objective:    Vitals BP (!) 142/82 (BP Location: Left Arm, Patient Position: Sitting, Cuff Size: Normal)   Pulse 93   Temp 97.7 F (36.5 C)   Resp 18   Ht 5\' 6"  (1.676 m)   Wt 128 lb 8 oz (58.3 kg)   SpO2 96%   BMI 20.74 kg/m    Physical Examination Physical Exam Constitutional:      Appearance: Normal appearance.  HENT:     Head: Normocephalic and atraumatic.  Cardiovascular:     Rate and Rhythm: Normal rate and regular rhythm.     Heart sounds: Normal heart sounds.  Pulmonary:     Effort: Pulmonary effort is normal.     Breath sounds: Normal breath sounds.  Neurological:     General: No focal deficit present.     Mental Status: She is alert and oriented to person, place, and time.        Assessment & Plan:   Hypertension Blood pressure has been fluctuating so will increase amlodipine to 5 mg daily  Dementia (HCC) Gradually getting worse, will start her on aricept 5 mg daily  Pseudobulbar affect She has no episode of crying since she was started on medicine    Return in about 2 months (around 10/13/2022).   Eloisa Northern, MD

## 2022-08-13 NOTE — Assessment & Plan Note (Addendum)
Gradually getting worse, will start her on aricept 5 mg daily

## 2022-08-14 MED ORDER — DEXTROMETHORPHAN-QUINIDINE 20-10 MG PO CAPS: 1.0000 | ORAL_CAPSULE | Freq: Two times a day (BID) | ORAL | 4 refills | Status: DC

## 2022-08-20 ENCOUNTER — Other Ambulatory Visit: Payer: Self-pay | Admitting: Internal Medicine

## 2022-08-20 MED ORDER — DONEPEZIL HCL 5 MG PO TABS: 5.0000 mg | ORAL_TABLET | Freq: Every day | ORAL | 2 refills | Status: DC

## 2022-08-31 IMAGING — CT CT SHOULDER*R* W/O CM
2 series · 10 of 14 positions shown, 12 images · non-contrast
Comparison: None.

CLINICAL DATA: Preop right shoulder replacement

EXAM:
CT OF THE UPPER RIGHT EXTREMITY WITHOUT CONTRAST
TECHNIQUE: Multidetector CT imaging of the upper right extremity was performed
according to the standard protocol.

[Series 2: bone · axial · 0.59mm/px · z∈[-377,-257]mm · 5 of 91 slices shown, 7 images]
[im 16/91  soft-tissue]
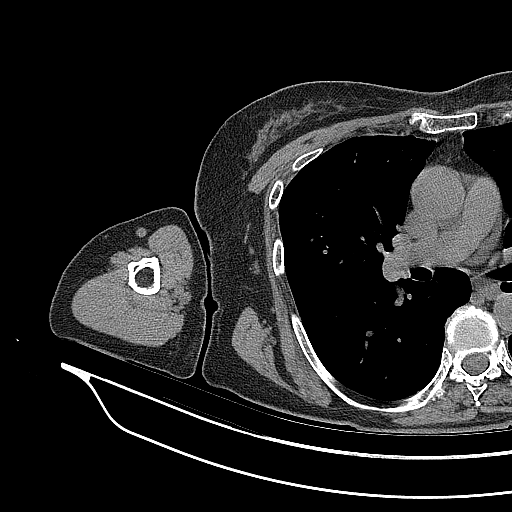
[im 16/91  bone]
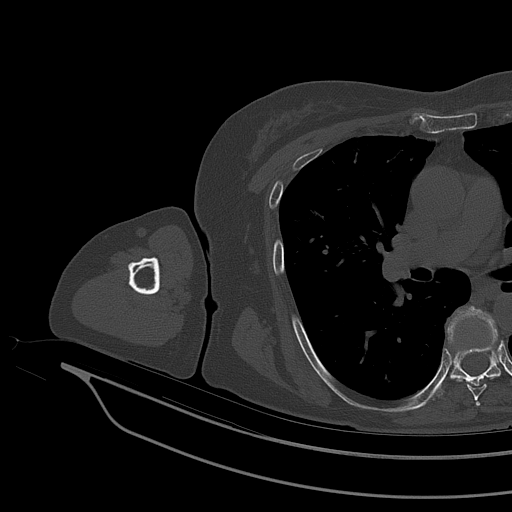
[im 31/91  bone]
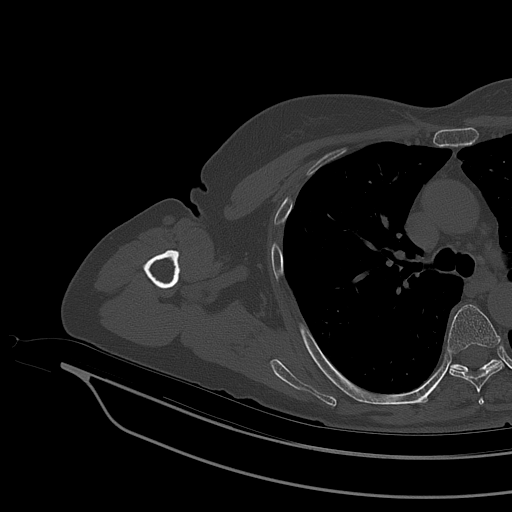
[im 46/91  bone]
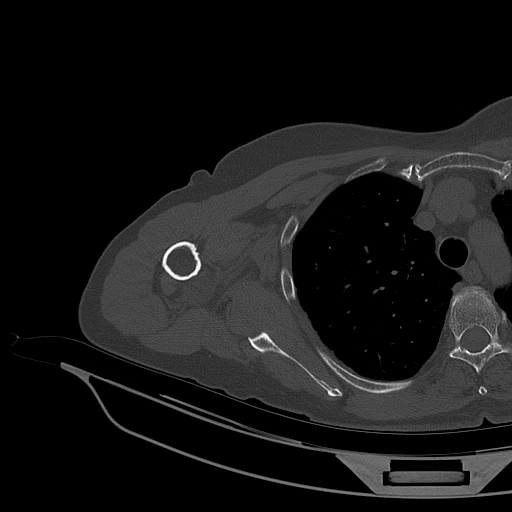
[im 61/91  bone]
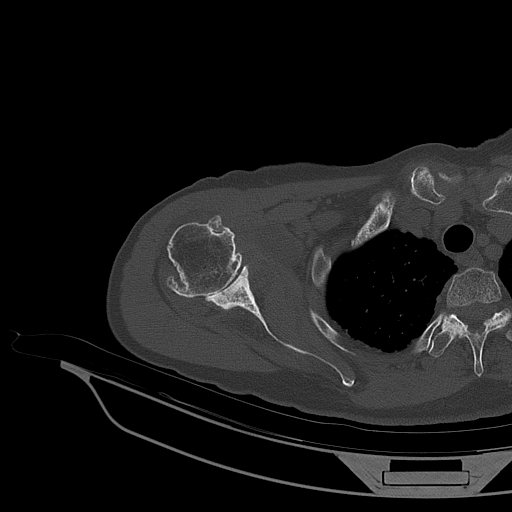
[im 76/91  soft-tissue]
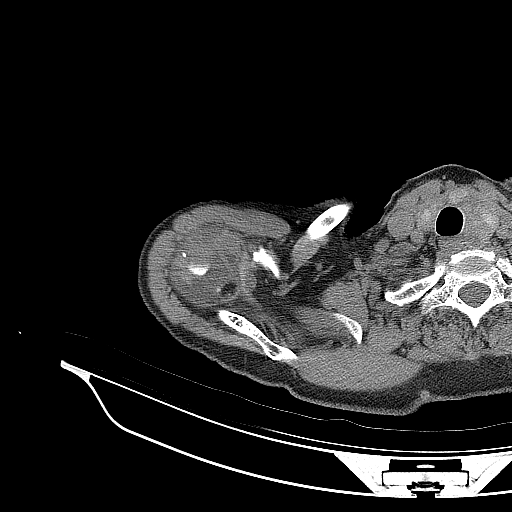
[im 76/91  bone]
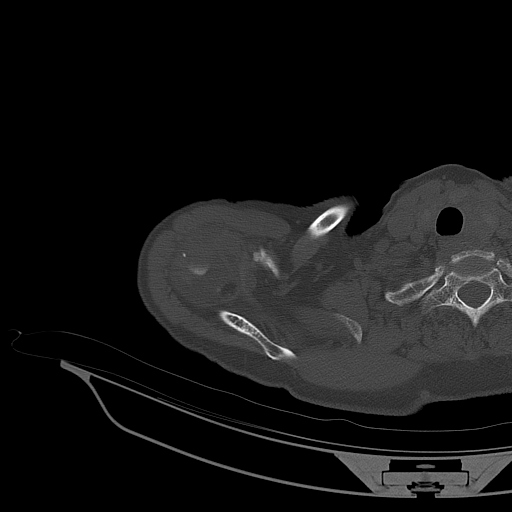

[Series 5: soft tissue · axial · 0.59mm/px · z∈[-375,-257]mm · 5 of 89 slices shown]
[im 15/89  soft-tissue]
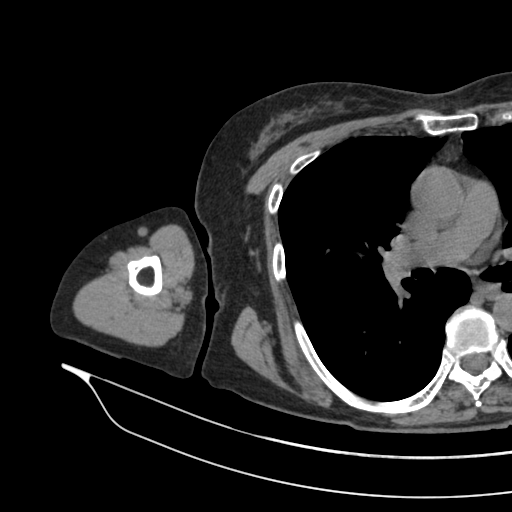
[im 30/89  soft-tissue]
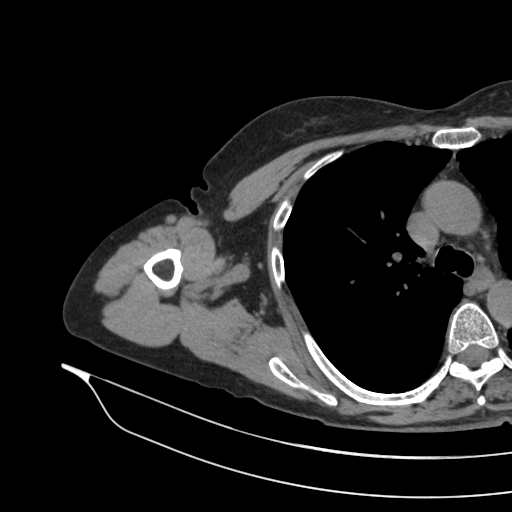
[im 45/89  soft-tissue]
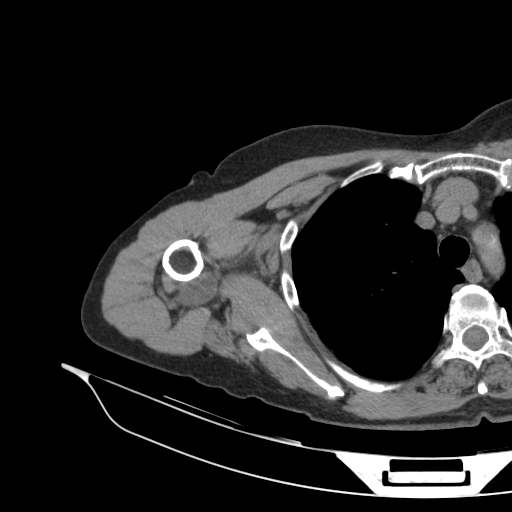
[im 59/89  soft-tissue]
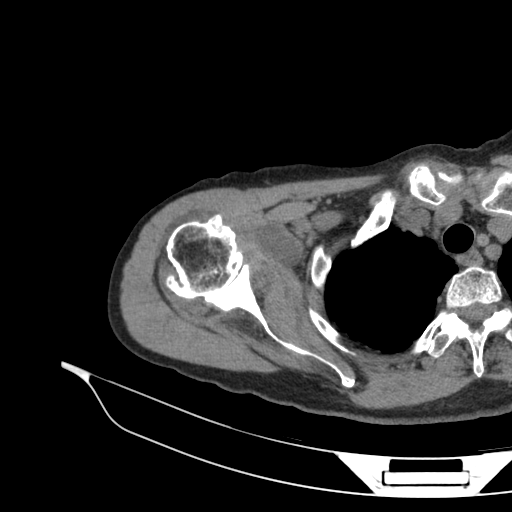
[im 74/89  soft-tissue]
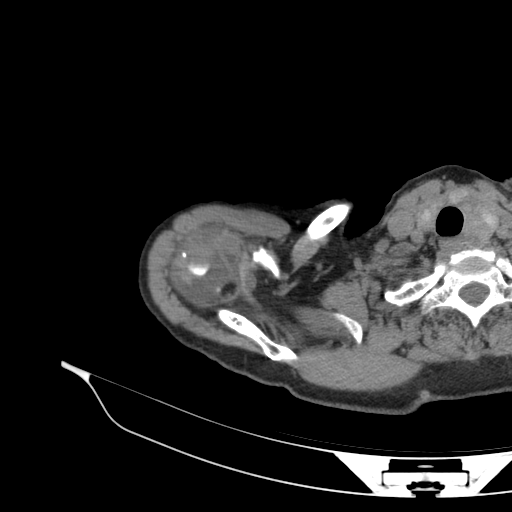

[10 of 14 positions shown; findings below may reference images not displayed]

FINDINGS: Bones/Joint/Cartilage

No fracture or dislocation. Normal alignment. Moderate glenohumeral
joint effusion.

Severe osteoarthritis of the glenohumeral joint with severe joint
space narrowing, a bone-on-bone appearance, subchondral sclerosis,
subchondral cystic changes and marginal osteophytosis. 13 mm loose
body in the subcoracoid recess.

Moderate arthropathy of the acromioclavicular joint. Large bony
protuberance along the peripheral undersurface of the acromion.

Ligaments

Ligaments are suboptimally evaluated by CT.

Muscles and Tendons
Mild atrophy of the supraspinatus and infraspinatus muscles.
Evaluation of the rotator cuff is limited on CT without
intra-articular contrast, but there is a complete tear of the
supraspinatus tendon.

Soft tissue
No fluid collection or hematoma. No soft tissue mass. Visualized
portions of the lung are clear.

Multiple hypodense nodules in the left-sided thyroid gland with an
overall heterogeneous appearance. Largest hypodense nodule measures
approximately 17 mm. Recommend thyroid US (ref: [HOSPITAL].
[DATE]): 143-50).
IMPRESSION: 1. Severe osteoarthritis of the glenohumeral joint.
2. Complete tear of the supraspinatus tendon.
3. Mild atrophy of the supraspinatus and infraspinatus muscles.
4. Multiple hypodense nodules in the left-sided thyroid gland with
an overall heterogeneous appearance. Largest hypodense nodule
measures approximately 17 mm. Recommend thyroid US (ref: [HOSPITAL]. [DATE]): 143-50).

## 2022-09-03 ENCOUNTER — Ambulatory Visit: Payer: Medicare HMO | Admitting: Internal Medicine

## 2022-09-03 ENCOUNTER — Encounter: Payer: Self-pay | Admitting: Internal Medicine

## 2022-09-03 VITALS — BP 132/70 | HR 91 | Temp 98.2°F | Resp 18 | Ht 66.0 in | Wt 126.4 lb

## 2022-09-03 DIAGNOSIS — R69 Illness, unspecified: Secondary | ICD-10-CM | POA: Diagnosis not present

## 2022-09-03 DIAGNOSIS — F419 Anxiety disorder, unspecified: Secondary | ICD-10-CM | POA: Diagnosis not present

## 2022-09-03 DIAGNOSIS — F410 Panic disorder [episodic paroxysmal anxiety] without agoraphobia: Secondary | ICD-10-CM | POA: Diagnosis not present

## 2022-09-03 NOTE — Assessment & Plan Note (Signed)
I have suggested to stop melatonin

## 2022-09-03 NOTE — Assessment & Plan Note (Signed)
Husband can give her 1/4 lorazepam if she has another panic attacks.

## 2022-09-03 NOTE — Progress Notes (Signed)
   Office Visit  Subjective   Patient ID: Shirley Woods   DOB: [birth year]   Age: 82 y.o.   MRN: [id number]   Chief Complaint Chief Complaint  Patient presents with   office visit    Patient wants to discuss medications     History of Present Illness 82 years old female who woke up yesterday night crying and had bad dream as well, her husband was worried that she was started on donepezil 5 mg 20 days ago and then husband brought over the counter melatonin 10 mg from [employer] last week. Last night he did not gave her melatonin and gave her lorazepam 1.25 mg and she slept like baby. Patient says she is feeling fine now.   He also says that he can not afford Nuedextra but he is finishing samples.   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   HENT: Negative.    Respiratory: Negative.    Cardiovascular: Negative.   Gastrointestinal: Negative.   Neurological: Negative.        Objective:    Vitals BP 132/70 (BP Location: Left Arm, Patient Position: Sitting, Cuff Size: Normal)   Pulse 91   Temp 98.2 F (36.8 C)   Resp 18   Ht  (1.676 m)   Wt 126 lb 6 oz (57.3 kg)   SpO2 98%   BMI 20.40 kg/m    Physical Examination Physical Exam Constitutional:      Appearance: Normal appearance.  Cardiovascular:     Rate and Rhythm: Normal rate and regular rhythm.     Heart sounds: Normal heart sounds.  Pulmonary:     Effort: Pulmonary effort is normal.     Breath sounds: Normal breath sounds.  Neurological:     Mental Status: She is alert.        Assessment & Plan:   Panic attacks I have suggested to stop melatonin  Anxiety Husband can give her 1/4 lorazepam if she has another panic  attacks.    No follow-ups on file.   Eloisa Northern, MD

## 2022-09-05 DIAGNOSIS — F1320 Sedative, hypnotic or anxiolytic dependence, uncomplicated: Secondary | ICD-10-CM | POA: Diagnosis not present

## 2022-09-05 DIAGNOSIS — F411 Generalized anxiety disorder: Secondary | ICD-10-CM | POA: Diagnosis not present

## 2022-09-05 DIAGNOSIS — Z803 Family history of malignant neoplasm of breast: Secondary | ICD-10-CM | POA: Diagnosis not present

## 2022-09-05 DIAGNOSIS — M1990 Unspecified osteoarthritis, unspecified site: Secondary | ICD-10-CM | POA: Diagnosis not present

## 2022-09-05 DIAGNOSIS — I129 Hypertensive chronic kidney disease with stage 1 through stage 4 chronic kidney disease, or unspecified chronic kidney disease: Secondary | ICD-10-CM | POA: Diagnosis not present

## 2022-09-05 DIAGNOSIS — F0280 Dementia in other diseases classified elsewhere without behavioral disturbance: Secondary | ICD-10-CM | POA: Diagnosis not present

## 2022-09-05 DIAGNOSIS — Z961 Presence of intraocular lens: Secondary | ICD-10-CM | POA: Diagnosis not present

## 2022-09-05 DIAGNOSIS — N182 Chronic kidney disease, stage 2 (mild): Secondary | ICD-10-CM | POA: Diagnosis not present

## 2022-09-05 DIAGNOSIS — G309 Alzheimer's disease, unspecified: Secondary | ICD-10-CM | POA: Diagnosis not present

## 2022-09-05 DIAGNOSIS — M810 Age-related osteoporosis without current pathological fracture: Secondary | ICD-10-CM | POA: Diagnosis not present

## 2022-09-05 DIAGNOSIS — K5900 Constipation, unspecified: Secondary | ICD-10-CM | POA: Diagnosis not present

## 2022-09-05 DIAGNOSIS — E785 Hyperlipidemia, unspecified: Secondary | ICD-10-CM | POA: Diagnosis not present

## 2022-09-18 ENCOUNTER — Other Ambulatory Visit: Payer: Self-pay | Admitting: Internal Medicine

## 2022-09-18 MED ORDER — LORAZEPAM 1 MG PO TABS: ORAL_TABLET | ORAL | 4 refills | Status: DC

## 2022-09-27 DIAGNOSIS — Z96611 Presence of right artificial shoulder joint: Secondary | ICD-10-CM | POA: Diagnosis not present

## 2022-09-27 DIAGNOSIS — T8484XA Pain due to internal orthopedic prosthetic devices, implants and grafts, initial encounter: Secondary | ICD-10-CM | POA: Diagnosis not present

## 2022-10-15 ENCOUNTER — Encounter: Payer: Self-pay | Admitting: Internal Medicine

## 2022-10-15 ENCOUNTER — Ambulatory Visit: Payer: Medicare HMO | Admitting: Internal Medicine

## 2022-10-15 VITALS — BP 122/70 | HR 90 | Temp 98.0°F | Resp 18 | Ht 66.0 in | Wt 127.0 lb

## 2022-10-15 DIAGNOSIS — Z0000 Encounter for general adult medical examination without abnormal findings: Secondary | ICD-10-CM | POA: Diagnosis not present

## 2022-10-15 DIAGNOSIS — M8580 Other specified disorders of bone density and structure, unspecified site: Secondary | ICD-10-CM | POA: Diagnosis not present

## 2022-10-15 DIAGNOSIS — Z6820 Body mass index (BMI) 20.0-20.9, adult: Secondary | ICD-10-CM

## 2022-10-15 DIAGNOSIS — G300 Alzheimer's disease with early onset: Secondary | ICD-10-CM | POA: Diagnosis not present

## 2022-10-15 DIAGNOSIS — F419 Anxiety disorder, unspecified: Secondary | ICD-10-CM

## 2022-10-15 DIAGNOSIS — F5101 Primary insomnia: Secondary | ICD-10-CM | POA: Diagnosis not present

## 2022-10-15 DIAGNOSIS — I10 Essential (primary) hypertension: Secondary | ICD-10-CM

## 2022-10-15 NOTE — Progress Notes (Addendum)
   Office Visit  Subjective   Patient ID: Shirley Woods   DOB: 1940-10-30   Age: 82 y.o.   MRN: [id number]   Chief Complaint Chief Complaint  Patient presents with   Annual Exam    Medicare annual wellness exam     History of Present Illness 82 years old female is here for AWE. She denies fall within last 1 year. 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 She take sertraline 100 mg daily without any side effects. She also has hypertension and her BP is well controlled on amlodipine 5 mg daily. 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 . No side effects. She has few episodes of crying.  She has last mammogram many years ago, she has colonoscopy in the past, she has hysterectomy.  She has pneumonia vaccine, she has two shingle vaccine, she get flu shot every year and has in fall. She has COVID vaccine.  She does not drive. She does not need any assistance in ADL. She can write check if she need it.  I have reviewed her depression screening, fall risk assessment and MMSE.     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   HENT: Negative.    Respiratory: Negative.    Cardiovascular: Negative.   Gastrointestinal: Negative.   Neurological: Negative.        Objective:    Vitals BP 122/70 (BP Location: Left Arm, Patient Position: Sitting, Cuff  Size: Normal)   Pulse 90   Temp 98 F (36.7 C)   Resp 18   Ht 5\' 6"  (1.676 m)   Wt 127 lb (57.6 kg)   SpO2 98%   BMI 20.50 kg/m    Physical Examination Physical Exam Constitutional:      Appearance: Normal appearance.  HENT:     Head: Normocephalic and atraumatic.  Cardiovascular:     Rate and Rhythm: Normal rate and regular rhythm.     Heart sounds: Normal heart sounds.  Pulmonary:     Effort: Pulmonary effort is normal.     Breath sounds: Normal breath sounds.  Abdominal:     General: Bowel sounds are normal.     Palpations: Abdomen is soft.  Neurological:     General: No focal deficit present.     Mental Status: She is alert and oriented to person, place, and time.        Assessment & Plan:   Hypertension stable  Dementia (HCC) She is forgetfull and she score 25/30 on MMSE. She is hard of hearing as well, I will start her on aricept 5 mg daily.     No follow-ups on file.   Eloisa Northern, MD

## 2022-11-02 ENCOUNTER — Other Ambulatory Visit: Payer: Self-pay | Admitting: Internal Medicine

## 2022-11-22 ENCOUNTER — Other Ambulatory Visit: Payer: Self-pay

## 2022-11-22 MED ORDER — SERTRALINE HCL 100 MG PO TABS: 100.0000 mg | ORAL_TABLET | Freq: Every day | ORAL | 0 refills | Status: DC

## 2022-12-03 ENCOUNTER — Other Ambulatory Visit: Payer: Self-pay | Admitting: Internal Medicine

## 2022-12-10 ENCOUNTER — Ambulatory Visit: Payer: Medicare HMO | Admitting: Internal Medicine

## 2022-12-10 ENCOUNTER — Encounter: Payer: Self-pay | Admitting: Internal Medicine

## 2022-12-10 VITALS — BP 118/70 | HR 76 | Temp 98.2°F | Resp 18 | Ht 62.0 in | Wt 126.0 lb

## 2022-12-10 DIAGNOSIS — L989 Disorder of the skin and subcutaneous tissue, unspecified: Secondary | ICD-10-CM | POA: Diagnosis not present

## 2022-12-10 DIAGNOSIS — F482 Pseudobulbar affect: Secondary | ICD-10-CM | POA: Diagnosis not present

## 2022-12-10 DIAGNOSIS — G300 Alzheimer's disease with early onset: Secondary | ICD-10-CM | POA: Diagnosis not present

## 2022-12-10 MED ORDER — DONEPEZIL HCL 10 MG PO TABS: 10.0000 mg | ORAL_TABLET | Freq: Every day | ORAL | 2 refills | Status: DC

## 2022-12-10 MED ORDER — ALPRAZOLAM 0.25 MG PO TABS: 0.2500 mg | ORAL_TABLET | Freq: Every day | ORAL | 1 refills | Status: DC

## 2022-12-10 NOTE — Assessment & Plan Note (Signed)
Increase donepezil to 10 mg daily.

## 2022-12-10 NOTE — Assessment & Plan Note (Signed)
She is forgetfull and she score 25/30 on MMSE. She is hard of hearing as well, I will start her on aricept 5 mg daily.

## 2022-12-10 NOTE — Progress Notes (Signed)
   Office Visit  Subjective   Patient ID: Shirley Woods   DOB: 01/23/1941   Age: 82 y.o.   MRN: [id number]   Chief Complaint Chief Complaint  Patient presents with   Office visit    Discuss Medications.     History of Present Illness 82 years old female who is here to discuss her increased crying episodes.  She has a history of pseudobulbar palsy and she was started on Nuedexta but she could not take because of the cost.  She started taking Xanax 1.5 mg at nighttime and she was sleeping okay.  But husband is worried that this crying episode and emotional instability is affecting her. She does has a history of anxiety and depression and she is taking sertraline 100 mg daily. Husband also noticed that she has a bump on her right mid back for few months it get better but does come back and it does itch.  Husband says that he has not noticed any difference with donepezil 5 mg daily.  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   Cardiovascular: Negative.   Psychiatric/Behavioral:  Positive for memory loss.        Objective:    Vitals BP 118/70 (BP Location: Left Arm, Patient Position: Sitting, Cuff Size: Normal)   Pulse 76   Temp 98.2 F (36.8 C)   Resp 18   Ht 5\' 2"  (1.575 m)   Wt 126 lb (57.2 kg)   SpO2 93%   BMI 23.05 kg/m    Physical Examination Physical Exam Constitutional:      Appearance: Normal appearance.  Cardiovascular:     Rate and Rhythm: Normal rate and regular rhythm.     Heart sounds: Normal heart sounds.  Pulmonary:     Effort: Pulmonary effort is normal.     Breath sounds: Normal breath sounds.  Skin:    Comments: SMALL PAPULE RIGHT MID BACK   Neurological:     General: No focal deficit present.     Mental Status: She is alert.        Assessment & Plan:   Dementia (HCC) Increase donepezil to 10 mg daily.  Pseudobulbar affect Will try to give her a sample of no DEXA again.  In the meantime husband will give Xanax 0.25 mg in the morning. I will do punch biopsy of that skin lesion next week.  No follow-ups on file.   Eloisa Northern, MD

## 2022-12-10 NOTE — Assessment & Plan Note (Signed)
Will try to give her a sample of no DEXA again.  In the meantime husband will give Xanax 0.25 mg in the morning.

## 2022-12-10 NOTE — Assessment & Plan Note (Signed)
stable °

## 2022-12-17 ENCOUNTER — Ambulatory Visit: Payer: Medicare HMO | Admitting: Internal Medicine

## 2022-12-17 ENCOUNTER — Encounter: Payer: Self-pay | Admitting: Internal Medicine

## 2022-12-17 VITALS — BP 110/70 | HR 74 | Temp 97.9°F | Resp 18 | Ht 62.0 in | Wt 125.0 lb

## 2022-12-17 DIAGNOSIS — L989 Disorder of the skin and subcutaneous tissue, unspecified: Secondary | ICD-10-CM | POA: Diagnosis not present

## 2022-12-17 DIAGNOSIS — F482 Pseudobulbar affect: Secondary | ICD-10-CM | POA: Diagnosis not present

## 2022-12-19 NOTE — Assessment & Plan Note (Signed)
I have given her samples of Neudextra for 3 weeks.

## 2022-12-19 NOTE — Assessment & Plan Note (Signed)
As lesion is almost resolved. I will not do punch biopsy.

## 2022-12-19 NOTE — Progress Notes (Signed)
   Office Visit  Subjective   Patient ID: Shirley Woods   DOB: Sep 29, 1940   Age: 82 y.o.   MRN: [id number]   Chief Complaint Chief Complaint  Patient presents with   Follow-up    1 week follow up     History of Present Illness 82 years old female who is here for punch biopsy of small irritated nodule on her right posterior back.  Husband says that he started using some ointment that he has at home and this knot has almost disappeared.  She denies any other complaint. 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  She has responded to nuedextra but cost was $500 from his pocket so he did not wanted to use it.  But she started having those episodes again.  I have discussed with her and her husband that I will bring sample again.  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   Skin:        Rash on her back is much better       Objective:    Vitals BP 110/70 (BP Location: Left Arm, Patient Position: Sitting, Cuff Size: Normal)   Pulse 74   Temp 97.9 F (36.6 C)   Resp 18   Ht 5\' 2"  (1.575 m)   Wt 125 lb (56.7 kg)   SpO2 96%   BMI 22.86 kg/m    Physical Examination Physical Exam Constitutional:      Appearance: Normal appearance.  Skin:    Comments: Lesion on her posterior back has almost disappear.  Neurological:     Mental Status: She is alert.        Assessment & Plan:   Skin lesion As lesion is almost resolved. I will not do punch biopsy.  Pseudobulbar affect I have given her samples of Neudextra for 3 weeks.    No follow-ups on file.   Eloisa Northern, MD

## 2023-01-03 ENCOUNTER — Emergency Department (HOSPITAL_BASED_OUTPATIENT_CLINIC_OR_DEPARTMENT_OTHER)
Admission: EM | Admit: 2023-01-03 | Discharge: 2023-01-03 | Disposition: A | Discharge status: Home / Self Care | Payer: Medicare HMO | Attending: Emergency Medicine | Admitting: Emergency Medicine

## 2023-01-03 ENCOUNTER — Emergency Department (HOSPITAL_BASED_OUTPATIENT_CLINIC_OR_DEPARTMENT_OTHER): Payer: Medicare HMO

## 2023-01-03 DIAGNOSIS — F0390 Unspecified dementia without behavioral disturbance: Secondary | ICD-10-CM | POA: Insufficient documentation

## 2023-01-03 DIAGNOSIS — R42 Dizziness and giddiness: Secondary | ICD-10-CM | POA: Diagnosis not present

## 2023-01-03 DIAGNOSIS — Z79899 Other long term (current) drug therapy: Secondary | ICD-10-CM | POA: Diagnosis not present

## 2023-01-03 DIAGNOSIS — H81391 Other peripheral vertigo, right ear: Secondary | ICD-10-CM

## 2023-01-03 DIAGNOSIS — I10 Essential (primary) hypertension: Secondary | ICD-10-CM | POA: Diagnosis not present

## 2023-01-03 DIAGNOSIS — G319 Degenerative disease of nervous system, unspecified: Secondary | ICD-10-CM | POA: Diagnosis not present

## 2023-01-03 LAB — CBC WITH DIFFERENTIAL/PLATELET
Abs Immature Granulocytes: 0.03 10*3/uL (ref 0.00–0.07)
Basophils Absolute: 0 10*3/uL (ref 0.0–0.1)
Basophils Relative: 0 %
Eosinophils Absolute: 0 10*3/uL (ref 0.0–0.5)
Eosinophils Relative: 0 %
HCT: 38.8 % (ref 36.0–46.0)
Hemoglobin: 13.3 g/dL (ref 12.0–15.0)
Immature Granulocytes: 0 %
Lymphocytes Relative: 6 %
Lymphs Abs: 0.5 10*3/uL — ABNORMAL LOW (ref 0.7–4.0)
MCH: 31.3 pg (ref 26.0–34.0)
MCHC: 34.3 g/dL (ref 30.0–36.0)
MCV: 91.3 fL (ref 80.0–100.0)
Monocytes Absolute: 0.7 10*3/uL (ref 0.1–1.0)
Monocytes Relative: 7 %
Neutro Abs: 8.6 10*3/uL — ABNORMAL HIGH (ref 1.7–7.7)
Neutrophils Relative %: 87 %
Platelets: 160 10*3/uL (ref 150–400)
RBC: 4.25 MIL/uL (ref 3.87–5.11)
RDW: 12.9 % (ref 11.5–15.5)
WBC: 9.9 10*3/uL (ref 4.0–10.5)
nRBC: 0 % (ref 0.0–0.2)

## 2023-01-03 LAB — BASIC METABOLIC PANEL
Anion gap: 11 (ref 5–15)
BUN: 18 mg/dL (ref 8–23)
CO2: 25 mmol/L (ref 22–32)
Calcium: 10.4 mg/dL — ABNORMAL HIGH (ref 8.9–10.3)
Chloride: 103 mmol/L (ref 98–111)
Creatinine, Ser: 0.84 mg/dL (ref 0.44–1.00)
GFR, Estimated: >60 mL/min (ref >60)
Glucose, Bld: 144 mg/dL — ABNORMAL HIGH (ref 70–99)
Potassium: 3.8 mmol/L (ref 3.5–5.1)
Sodium: 139 mmol/L (ref 135–145)

## 2023-01-03 LAB — CBG MONITORING, ED: Glucose-Capillary: 135 mg/dL — ABNORMAL HIGH (ref 70–99)

## 2023-01-03 MED ORDER — MECLIZINE HCL 25 MG PO TABS
25.0000 mg | ORAL_TABLET | Freq: Once | ORAL | Status: AC
  Administered 2023-01-03: 25 mg via ORAL
  Filled 2023-01-03: qty 1

## 2023-01-03 MED ORDER — MECLIZINE HCL 25 MG PO TABS: 25.0000 mg | ORAL_TABLET | Freq: Three times a day (TID) | ORAL | 0 refills | Status: DC | PRN

## 2023-01-03 NOTE — ED Provider Notes (Signed)
[place] EMERGENCY DEPARTMENT AT [facility] Provider Note   CSN: [id number] Arrival date & time: 01/03/23  1724     History  Chief Complaint  Patient presents with   Emesis   Weakness    Shirley Woods is a 82 y.o. female.  Patient is an 82 year old female with past medical history of hypertension, anxiety, dementia and prior subarachnoid hemorrhage years ago that did not require intervention who is presenting today with complaints of dizziness nausea and vomiting.  Her husband reports about 2 hours ago is when he noticed her not being herself.  He states earlier today she was normal was walking around normally but after lunch she went to lay down and he noticed something was off.  She states that anytime she moves her head or tries to get up she feels more dizzy.  He reported about an hour ago she started having vomiting and dry heaving.  He had a lot of trouble getting her up to walk because she complained of being dizzy.  She reports right now while she is lying in bed she does not feel too bad but if she starts moving around the dizziness is worse.  She denies any chest pain, shortness of breath, palpitations, abdominal pain.  No recent medication changes.  No prior history of similar symptoms.  She denies headache or neck pain.  Husband denies any facial droop, garbled speech, unilateral numbness or weakness.  The history is provided by the patient, the spouse and medical records.  Emesis Weakness Associated symptoms: vomiting        Home Medications Prior to Admission medications   Medication Sig Start Date End Date Taking? Authorizing Provider  meclizine (ANTIVERT) 25 MG tablet Take 1 tablet (25 mg total) by mouth 3 (three) times daily as needed for dizziness. 01/03/23  Yes Danton Palmateer, Alphonzo Lemmings, MD  ALPRAZolam Prudy Feeler) 0.25 MG tablet Take 1 tablet (0.25 mg total) by mouth daily. 12/10/22 12/10/23  Eloisa Northern, MD  amLODipine (NORVASC) 5 MG tablet Take 1 tablet (5 mg total)  by mouth daily. 08/13/22   Eloisa Northern, MD  Carboxymethylcellulose Sodium 1 % GEL Place 1 application into both eyes daily as needed (dry eyes).    [provider]  Cholecalciferol (VITAMIN D) 50 MCG (2000 UT) tablet Take 2,000 Units by mouth daily.    [provider]  Cyanocobalamin (B-12 PO) Take 1 capsule by mouth every other day.    [provider]  diclofenac Sodium (VOLTAREN) 1 % GEL Apply 2 g topically 4 (four) times daily. 07/02/22   Eloisa Northern, MD  donepezil (ARICEPT) 10 MG tablet Take 1 tablet (10 mg total) by mouth at bedtime. 12/10/22 12/10/23  Eloisa Northern, MD  LORazepam (ATIVAN) 1 MG tablet Take 1.5 mg ( 1 1/2 tablet) PO at night 09/18/22   Eloisa Northern, MD  Multiple Vitamin (MULTIVITAMIN WITH MINERALS) TABS tablet Take 1 tablet by mouth daily.    [provider]  OVER THE COUNTER MEDICATION Take 3 tablets by mouth 2 (two) times daily. algea cal    [provider]  OVER THE COUNTER MEDICATION Take 2 tablets by mouth every evening. strontium boost (at least 2 hours after the 2nd dose of algea cal)    [provider]  OVER THE COUNTER MEDICATION Place 1 patch onto the skin at bedtime. Zleep otc sleep patches    [provider]  Plant Sterols and Stanols (CHOLEST OFF PO) Take 2 tablets by mouth 2 (two) times  daily.    [provider]  sennosides-docusate sodium (SENOKOT-S) 8.6-50 MG tablet Take 2 tablets by mouth daily. 11/28/20   Janine Ores K, PA-C  sertraline (ZOLOFT) 100 MG tablet Take 1 tablet (100 mg total) by mouth daily. 11/22/22   Eloisa Northern, MD  zinc gluconate 50 MG tablet Take 50 mg by mouth daily.    [provider]      Allergies    Patient has no known allergies.    Review of Systems   Review of Systems  Gastrointestinal:  Positive for vomiting.  Neurological:  Positive for weakness.    Physical Exam Updated Vital Signs BP (!) 158/65 (BP Location: Left Arm)   Pulse 67   Temp (!) 97.4 F (36.3  C) (Oral)   Resp 16   SpO2 97%  Physical Exam Vitals and nursing note reviewed.  Constitutional:      General: She is not in acute distress.    Appearance: She is well-developed.  HENT:     Head: Normocephalic and atraumatic.     Right Ear: Tympanic membrane normal.     Left Ear: Tympanic membrane normal.  Eyes:     General: No visual field deficit.    Conjunctiva/sclera: Conjunctivae normal.     Pupils: Pupils are equal, round, and reactive to light.     Comments: Nystagmus noted mostly with rightward gaze  Cardiovascular:     Rate and Rhythm: Normal rate and regular rhythm.     Heart sounds: No murmur heard. Pulmonary:     Effort: Pulmonary effort is normal. No respiratory distress.     Breath sounds: Normal breath sounds. No wheezing or rales.  Abdominal:     General: There is no distension.     Palpations: Abdomen is soft.     Tenderness: There is no abdominal tenderness. There is no guarding or rebound.  Musculoskeletal:        General: No tenderness. Normal range of motion.     Cervical back: Normal range of motion and neck supple.  Skin:    General: Skin is warm and dry.     Findings: No erythema or rash.  Neurological:     Mental Status: She is alert and oriented to person, place, and time.     Cranial Nerves: No dysarthria or facial asymmetry.     Sensory: Sensation is intact.     Motor: No weakness or pronator drift.     Coordination: Coordination is intact. Coordination normal. Finger-Nose-Finger Test and Heel to [facility] Test normal.  Psychiatric:        Behavior: Behavior normal.     ED Results / Procedures / Treatments   Labs (all labs ordered are listed, but only abnormal results are displayed) Labs Reviewed  BASIC METABOLIC PANEL - Abnormal; Notable for the following components:      Result Value   Glucose, Bld 144 (*)    Calcium 10.4 (*)    All other components within normal limits  CBC WITH DIFFERENTIAL/PLATELET - Abnormal; Notable for the following  components:   Neutro Abs 8.6 (*)    Lymphs Abs 0.5 (*)    All other components within normal limits  CBG MONITORING, ED - Abnormal; Notable for the following components:   Glucose-Capillary 135 (*)    All other components within normal limits  CBC WITH DIFFERENTIAL/PLATELET    EKG EKG Interpretation Date/Time:  Friday January 03 2023 17:32:46 EDT Ventricular Rate:  74 PR Interval:  128 QRS Duration:  80 QT Interval:  420 QTC Calculation: 466 R Axis:   11  Text Interpretation: Normal sinus rhythm Left ventricular hypertrophy with repolarization abnormality ( R in aVL , Sokolow-Lyon , Cornell product ) When compared with ECG of 15-Nov-2020 08:57, QT has lengthened otherwise no significant changes Confirmed by Gwyneth Sprout ([id number]) on 01/03/2023 5:59:24 PM  Radiology CT Head Wo Contrast  Result Date: 01/03/2023 CLINICAL DATA:  Vertigo, central EXAM: CT HEAD WITHOUT CONTRAST TECHNIQUE: Contiguous axial images were obtained from the base of the skull through the vertex without intravenous contrast. RADIATION DOSE REDUCTION: This exam was performed according to the departmental dose-optimization program which includes automated exposure control, adjustment of the mA and/or kV according to patient size and/or use of iterative reconstruction technique. COMPARISON:  Brain MRI 06/23/2022 FINDINGS: Brain: No intracranial hemorrhage, mass effect, or midline shift. Age related atrophy. No hydrocephalus. The basilar cisterns are patent. Chronic small vessel ischemic change. No evidence of territorial infarct or acute ischemia. No extra-axial or intracranial fluid collection. Vascular: Atherosclerosis of skullbase vasculature without hyperdense vessel or abnormal calcification. Skull: No fracture or focal lesion. Sinuses/Orbits: Paranasal sinuses and mastoid air cells are clear. The visualized orbits are unremarkable. Bilateral cataract resection. Other: None. IMPRESSION: 1. No acute intracranial  abnormality. 2. Age related atrophy and chronic small vessel ischemic change. Electronically Signed   By: Narda Rutherford M.D.   On: 01/03/2023 19:00    Procedures Procedures    Medications Ordered in ED Medications  meclizine (ANTIVERT) tablet 25 mg (25 mg Oral Given 01/03/23 1811)    ED Course/ Medical Decision Making/ A&P                                 Medical Decision Making Amount and/or Complexity of Data Reviewed Independent Historian: spouse Labs: ordered. Decision-making details documented in ED Course. Radiology: ordered and independent interpretation performed. Decision-making details documented in ED Course. ECG/medicine tests: ordered and independent interpretation performed. Decision-making details documented in ED Course.   Pt with multiple medical problems and comorbidities and presenting today with a complaint that caries a high risk for morbidity and mortality. Pt with sx most consistent with peripheral vertigo.  No systemic or infectious sx.  Normal neuro exam without weakness or cerebellar findings on exam but pt is too dizzy to walk at this time.  Normal vision.  Sx are reproducible with movement of the head and attempting to walk.  No hx of Stroke but does have some risk factors.  Hypertensive here today but no c/o of HA, neck pain and low suspicion for recurrent SAH, dural thrombosis or vertebral dissection.  Will treat for peripheral vertigo and re-eval.  7:45 PM I independently interpreted patient's EKG and labs.  EKG without acute findings today.  CBC and BMP within normal limits.  I have independently visualized and interpreted pt's images today.  Head CT negative for acute bleed or lesions.  Radiology reports age-related changes but no other acute findings.  After 1 dose of meclizine patient is feeling much better.  Patient got up and independently walk to the bathroom without any dizziness or complaints.  At this time feel that patient's symptoms were  peripheral vertigo.  Do not feel that she needs any further imaging or stroke workup at this time.  Discussed these findings with the patient and her husband.  She will be discharged home with a prescription for meclizine to use as needed.  She was given return precautions.  No indication for admission at this time and no social determinants affecting her discharge.           Final Clinical Impression(s) / ED Diagnoses Final diagnoses:  Peripheral vertigo involving right ear    Rx / DC Orders ED Discharge Orders          Ordered    meclizine (ANTIVERT) 25 MG tablet  3 times daily PRN        01/03/23 1943              Gwyneth Sprout, MD 01/03/23 1946

## 2023-01-03 NOTE — Discharge Instructions (Addendum)
All the blood work and the CT today looked good.  It seems that you have vertigo.  If you develop any slurred speech, loss of vision, facial drooping, weakness and numbness on one side of your body, inability to walk you should call the ambulance immediately.  You will have good days and bad days with vertigo.  You can use the medicine as needed.  Try to avoid sudden movements which can sometimes set off vertigo.

## 2023-01-03 NOTE — ED Triage Notes (Signed)
Pt assisted out of car to triage by staff. Per pts husband, pt has been lightheaded all day, attempted to leave the house and could barely stand up. Also became nauseated in the past hour, vomiting in triage.

## 2023-01-07 ENCOUNTER — Ambulatory Visit: Payer: Medicare HMO | Admitting: Internal Medicine

## 2023-01-07 ENCOUNTER — Encounter: Payer: Self-pay | Admitting: Internal Medicine

## 2023-01-07 VITALS — BP 124/70 | HR 75 | Temp 98.0°F | Resp 18 | Ht 62.0 in | Wt 125.1 lb

## 2023-01-07 DIAGNOSIS — R42 Dizziness and giddiness: Secondary | ICD-10-CM | POA: Diagnosis not present

## 2023-01-07 NOTE — Progress Notes (Signed)
   Office Visit  Subjective   Patient ID: Shirley Woods   DOB: 01-31-1941   Age: 82 y.o.   MRN: [id number]   Chief Complaint Follow up from ED    History of Present Illness 82 years old female who suddenly has vertigo on Friday afternoon, she felt very bad. Her husband checked her blood pressure and that was very high, he took her to ED [facility] where CT head was negative, lab drawn was and she was given meclizine.  She says that she does not have any more vertigo but she feels lightheaded and feels something is not right in her head.  No stuffy nose, no frontal headache.  No fever or chills.  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   Respiratory: Negative.    Cardiovascular: Negative.   Neurological:  Positive for dizziness.       Objective:    Vitals There were no vitals taken for this visit.   Physical Examination Physical Exam Constitutional:      Appearance: Normal appearance.  HENT:     Head: Normocephalic.  Cardiovascular:     Rate and Rhythm: Normal rate and regular rhythm.     Heart sounds: Normal heart sounds.  Pulmonary:     Effort: Pulmonary effort is normal.     Breath sounds: Normal breath sounds.  Neurological:     General: No focal deficit present.     Mental Status: She is alert and oriented to person, place, and time.        Assessment & Plan:   Dizziness I have increased the dose of Aricept from 5 to 10 mg daily few weeks ago.  I will stop Aricept and monitor her.  Vertigo No more vertigo since Friday.  But she has meclizine to use as needed.    No follow-ups on file.   Eloisa Northern, MD

## 2023-01-07 NOTE — Assessment & Plan Note (Signed)
I have increased the dose of Aricept from 5 to 10 mg daily few weeks ago.  I will stop Aricept and monitor her.

## 2023-01-07 NOTE — Assessment & Plan Note (Signed)
No more vertigo since Friday.  But she has meclizine to use as needed.

## 2023-01-14 ENCOUNTER — Encounter: Payer: Self-pay | Admitting: Internal Medicine

## 2023-01-14 ENCOUNTER — Ambulatory Visit: Payer: Medicare HMO | Admitting: Internal Medicine

## 2023-01-14 VITALS — BP 120/70 | HR 81 | Temp 98.3°F | Resp 18 | Ht 62.0 in | Wt 126.0 lb

## 2023-01-14 DIAGNOSIS — F482 Pseudobulbar affect: Secondary | ICD-10-CM

## 2023-01-14 DIAGNOSIS — I10 Essential (primary) hypertension: Secondary | ICD-10-CM

## 2023-01-14 DIAGNOSIS — F5101 Primary insomnia: Secondary | ICD-10-CM | POA: Diagnosis not present

## 2023-01-14 DIAGNOSIS — G300 Alzheimer's disease with early onset: Secondary | ICD-10-CM | POA: Diagnosis not present

## 2023-01-14 DIAGNOSIS — F02A0 Dementia in other diseases classified elsewhere, mild, without behavioral disturbance, psychotic disturbance, mood disturbance, and anxiety: Secondary | ICD-10-CM

## 2023-01-14 NOTE — Progress Notes (Signed)
   Office Visit  Subjective   Patient ID: Shirley Woods   DOB: [birth year]   Age: 82 y.o.   MRN: [id number]   Chief Complaint Chief Complaint  Patient presents with   Follow-up    3 month follow up     History of Present Illness 82 years old female who was seen by me last week because of dizziness and follow-up from emergency room visit.  I have stopped her donepezil 10 mg daily and per her husband she did not have any more dizziness and lightheadedness since then.  She says that she feels fine and enjoy her life.  Her husband did not notice any difference with donepezil 10 mg and her forgetfulness remained stable. She also has pseudobulbar palsy and I have given her Nuedexta sample but her husband has not tried that yet.  He says that her crying episodes are also better. She has a history of intracranial hemorrhage in the past. She has insomnia and anxiety and has been using lorazepam for years.  I have decreased the dose from 2 mg to 1 mg at nighttime and half tablets as needed. Hypertension her blood pressure is stable on amlodipine 5 mg daily.  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   HENT: Negative.    Respiratory: Negative.    Cardiovascular: Negative.   Gastrointestinal: Negative.   Neurological: Negative.        Objective:    Vitals BP 120/70 (BP Location: Left Arm, Patient Position: Sitting, Cuff Size: Normal)   Pulse 81   Temp 98.3 F (36.8 C)   Resp 18   Ht 5\' 2"  (1.575 m)   Wt 126 lb (57.2 kg)   SpO2 97%   BMI 23.05 kg/m    Physical Examination Physical Exam Constitutional:      Appearance: Normal appearance.  HENT:     Head: Normocephalic and atraumatic.   Cardiovascular:     Rate and Rhythm: Normal rate and regular rhythm.     Heart sounds: Normal heart sounds.  Pulmonary:     Effort: Pulmonary effort is normal.     Breath sounds: Normal breath sounds.  Abdominal:     General: Bowel sounds are normal.     Palpations: Abdomen is soft.  Neurological:     General: No focal deficit present.     Mental Status: She is alert and oriented to person, place, and time.        Assessment & Plan:   Hypertension stable  Dementia (HCC) As her memory is stable and I have discussed with husband we will continue to monitor.  Primary insomnia She will continue with lorazepam.  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    Return in about 3 months (around 04/15/2023).   Eloisa Northern, MD

## 2023-01-14 NOTE — Assessment & Plan Note (Signed)
stable °

## 2023-01-14 NOTE — Assessment & Plan Note (Signed)
Stable without any effect.

## 2023-01-14 NOTE — Assessment & Plan Note (Signed)
As her memory is stable and I have discussed with husband we will continue to monitor.

## 2023-01-14 NOTE — Assessment & Plan Note (Signed)
She will continue with lorazepam.

## 2023-01-14 NOTE — Addendum Note (Signed)
Addended byEloisa Northern on: 01/14/2023 03:18 PM   Modules accepted: Orders

## 2023-02-06 ENCOUNTER — Other Ambulatory Visit: Payer: Self-pay | Admitting: Internal Medicine

## 2023-02-07 ENCOUNTER — Other Ambulatory Visit: Payer: Self-pay | Admitting: Internal Medicine

## 2023-02-07 MED ORDER — LORAZEPAM 1 MG PO TABS: ORAL_TABLET | ORAL | 4 refills | Status: DC

## 2023-02-16 ENCOUNTER — Other Ambulatory Visit: Payer: Self-pay | Admitting: Internal Medicine

## 2023-02-17 ENCOUNTER — Other Ambulatory Visit: Payer: Self-pay | Admitting: Internal Medicine

## 2023-04-22 ENCOUNTER — Telehealth: Payer: Medicare HMO | Admitting: Internal Medicine

## 2023-04-22 VITALS — BP 131/74 | HR 86

## 2023-04-22 DIAGNOSIS — F331 Major depressive disorder, recurrent, moderate: Secondary | ICD-10-CM | POA: Diagnosis not present

## 2023-04-22 DIAGNOSIS — I10 Essential (primary) hypertension: Secondary | ICD-10-CM

## 2023-04-22 MED ORDER — CARIPRAZINE HCL 1.5 MG PO CAPS: 1.5000 mg | ORAL_CAPSULE | Freq: Every day | ORAL | 2 refills | Status: DC

## 2023-04-22 NOTE — Assessment & Plan Note (Signed)
Blood pressure is controlled on amlodipine 5 mg daily.

## 2023-04-22 NOTE — Progress Notes (Signed)
   Office Visit  Subjective   Patient ID: Shirley Woods   DOB: 06/20/1940   Age: 82 y.o.   MRN: [id number]   Chief Complaint  Video visit because her husband was sick and could not bring her to clinic.     History of Present Illness 82 years old female with history of depression and is on sertraline 100 mg daily.  She also has a history of pseudobulbar affect.  Per patient she has been feeling depressed for last few months.  She says that she is sleeping okay and her appetite is good.  But she is still feel depressed.  She denies having any suicidal or homicidal ideation.  She says that she cries a lot.   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   Respiratory: Negative.    Cardiovascular: Negative.   Psychiatric/Behavioral:  Positive for depression.        Objective:    Vitals There were no vitals taken for this visit.   Physical Examination Physical Exam Neurological:     Mental Status: She is alert.     As this was a telehealth visit so no physical exam was done.    Assessment & Plan:   Moderate episode of recurrent major depressive disorder (HCC) Continue with sertraline 100 mg daily and I will add Vraylar 1.5 mg daily.  Side effects were discussed and she will call if she has any side effect.  Hypertension Blood pressure is controlled on amlodipine 5 mg daily.    Return in about 1 month (around 05/23/2023).   Eloisa Northern, MD

## 2023-04-22 NOTE — Assessment & Plan Note (Signed)
Continue with sertraline 100 mg daily and I will add Vraylar 1.5 mg daily.  Side effects were discussed and she will call if she has any side effect.

## 2023-04-23 ENCOUNTER — Other Ambulatory Visit: Payer: Self-pay | Admitting: Internal Medicine

## 2023-04-23 MED ORDER — ARIPIPRAZOLE 2 MG PO TABS: 2.0000 mg | ORAL_TABLET | Freq: Every day | ORAL | 3 refills | Status: DC

## 2023-05-06 ENCOUNTER — Other Ambulatory Visit: Payer: Self-pay

## 2023-05-06 ENCOUNTER — Encounter (HOSPITAL_BASED_OUTPATIENT_CLINIC_OR_DEPARTMENT_OTHER): Payer: Self-pay | Admitting: Emergency Medicine

## 2023-05-06 ENCOUNTER — Emergency Department (HOSPITAL_BASED_OUTPATIENT_CLINIC_OR_DEPARTMENT_OTHER)
Admission: EM | Admit: 2023-05-06 | Discharge: 2023-05-06 | Disposition: A | Discharge status: Home / Self Care | Payer: Medicare HMO | Attending: Emergency Medicine | Admitting: Emergency Medicine

## 2023-05-06 ENCOUNTER — Emergency Department (HOSPITAL_BASED_OUTPATIENT_CLINIC_OR_DEPARTMENT_OTHER): Payer: Medicare HMO | Admitting: Radiology

## 2023-05-06 DIAGNOSIS — W06XXXA Fall from bed, initial encounter: Secondary | ICD-10-CM | POA: Diagnosis not present

## 2023-05-06 DIAGNOSIS — R0789 Other chest pain: Secondary | ICD-10-CM | POA: Diagnosis present

## 2023-05-06 DIAGNOSIS — S20212A Contusion of left front wall of thorax, initial encounter: Secondary | ICD-10-CM | POA: Diagnosis not present

## 2023-05-06 DIAGNOSIS — M545 Low back pain, unspecified: Secondary | ICD-10-CM | POA: Diagnosis not present

## 2023-05-06 DIAGNOSIS — S2242XA Multiple fractures of ribs, left side, initial encounter for closed fracture: Secondary | ICD-10-CM | POA: Diagnosis not present

## 2023-05-06 DIAGNOSIS — R918 Other nonspecific abnormal finding of lung field: Secondary | ICD-10-CM | POA: Diagnosis not present

## 2023-05-06 MED ORDER — OXYCODONE-ACETAMINOPHEN 5-325 MG PO TABS
1.0000 | ORAL_TABLET | Freq: Once | ORAL | Status: AC
  Administered 2023-05-06: 1 via ORAL
  Filled 2023-05-06: qty 1

## 2023-05-06 MED ORDER — LIDOCAINE 4 % EX PTCH: 1.0000 | MEDICATED_PATCH | Freq: Two times a day (BID) | CUTANEOUS | 0 refills | Status: DC

## 2023-05-06 MED ORDER — NAPROXEN 375 MG PO TABS: 375.0000 mg | ORAL_TABLET | Freq: Two times a day (BID) | ORAL | 0 refills | Status: DC

## 2023-05-06 MED ORDER — OXYCODONE-ACETAMINOPHEN 5-325 MG PO TABS: 1.0000 | ORAL_TABLET | Freq: Three times a day (TID) | ORAL | 0 refills | Status: DC | PRN

## 2023-05-06 MED ORDER — LIDOCAINE 5 % EX PTCH
1.0000 | MEDICATED_PATCH | CUTANEOUS | Status: DC
  Administered 2023-05-06: 1 via TRANSDERMAL
  Filled 2023-05-06: qty 1

## 2023-05-06 MED ORDER — ACETAMINOPHEN 500 MG PO TABS: 500.0000 mg | ORAL_TABLET | Freq: Four times a day (QID) | ORAL | 0 refills | Status: DC | PRN

## 2023-05-06 NOTE — ED Notes (Signed)
Discharge instructions, prescriptions, pain management, and follow up care reviewed and explained, pt verbalized understanding and had no further questions on d/c. Pt caox4, ambulatory, NAD on d/c.

## 2023-05-06 NOTE — Discharge Instructions (Addendum)
Take the medications prescribed for pain control. Be very careful when you take the narcotic pain medicine/oxycodone.  Especially since you take Ativan at night.  We would prefer that you do not take oxycodone Ativan at the same time given the sedating effects of both of those medications.  Please follow-up with your primary care doctor in 1 week.

## 2023-05-06 NOTE — ED Triage Notes (Signed)
Reports falling today. Denies hitting head or LOC. Denies blood thinners. C/o left sided rib pain and lower back pain.

## 2023-05-06 NOTE — ED Provider Notes (Signed)
[place] [facility] AT [facility] Provider Note   CSN: [id number] Arrival date & time: 05/06/23  1508     History  Chief Complaint  Patient presents with   Finis Bud Rizzolo is a 82 y.o. female.  HPI    82 year old female comes in with chief complaint of mechanical fall. Patient accompanied by her husband.  Patient slipped off of her bed, in the process falling onto the side of the bed leading to blunt trauma to the left side of her chest and back.  Patient is having significant discomfort over the left side of the chest and mid back with inspiration and with any movement.  She did not strike her head and denies any loss of consciousness, neck pain.  Patient has ambulated since the injury.  Home Medications Prior to Admission medications   Medication Sig Start Date End Date Taking? Authorizing Provider  amLODipine (NORVASC) 5 MG tablet Take 1 tablet (5 mg total) by mouth daily. 08/13/22   Eloisa Northern, MD  ARIPiprazole (ABILIFY) 2 MG tablet Take 1 tablet (2 mg total) by mouth daily. 04/23/23   Eloisa Northern, MD  Carboxymethylcellulose Sodium 1 % GEL Place 1 application into both eyes daily as needed (dry eyes).    [provider]  Cholecalciferol (VITAMIN D) 50 MCG (2000 UT) tablet Take 2,000 Units by mouth daily.    [provider]  Cyanocobalamin (B-12 PO) Take 1 capsule by mouth every other day.    [provider]  diclofenac Sodium (VOLTAREN) 1 % GEL Apply 2 g topically 4 (four) times daily. 07/02/22   Eloisa Northern, MD  LORazepam (ATIVAN) 1 MG tablet Take 1.5 mg ( 1 1/2 tablet) PO at night 02/07/23   Eloisa Northern, MD  meclizine (ANTIVERT) 25 MG tablet Take 1 tablet (25 mg total) by mouth 3 (three) times daily as needed for dizziness. 01/03/23   Gwyneth Sprout, MD  Multiple Vitamin (MULTIVITAMIN WITH MINERALS) TABS tablet Take 1 tablet by mouth daily.    [provider]  OVER THE COUNTER MEDICATION Take 3 tablets by mouth 2  (two) times daily. algea cal    [provider]  OVER THE COUNTER MEDICATION Take 2 tablets by mouth every evening. strontium boost (at least 2 hours after the 2nd dose of algea cal)    [provider]  OVER THE COUNTER MEDICATION Place 1 patch onto the skin at bedtime. Zleep otc sleep patches    [provider]  Plant Sterols and Stanols (CHOLEST OFF PO) Take 2 tablets by mouth 2 (two) times daily.    [provider]  sennosides-docusate sodium (SENOKOT-S) 8.6-50 MG tablet Take 2 tablets by mouth daily. 11/28/20   Janine Ores K, PA-C  sertraline (ZOLOFT) 100 MG tablet TAKE ONE TABLET BY MOUTH ONCE A DAY 02/17/23   Eloisa Northern, MD  zinc gluconate 50 MG tablet Take 50 mg by mouth daily.    [provider]      Allergies    Patient has no known allergies.    Review of Systems   Review of Systems  All other systems reviewed and are negative.   Physical Exam Updated Vital Signs BP (!) 160/85 (BP Location: Right Arm)   Pulse 78   Temp 97.8 F (36.6 C) (Oral)   Resp 16   Ht 5\' 2"  (1.575 m)   Wt 54.4 kg   SpO2 100%   BMI 21.95 kg/m  Physical Exam Vitals and  nursing note reviewed.  Constitutional:      Appearance: She is well-developed.  HENT:     Head: Normocephalic and atraumatic.  Eyes:     Extraocular Movements: Extraocular movements intact.     Pupils: Pupils are equal, round, and reactive to light.  Neck:     Comments: No midline c-spine tenderness Cardiovascular:     Rate and Rhythm: Normal rate and regular rhythm.  Pulmonary:     Effort: Pulmonary effort is normal. No respiratory distress.     Breath sounds: Normal breath sounds.     Comments: Left lateral chest wall tenderness and left flank, chest wall tenderness.  Patient has no abdominal tenderness.  No ecchymosis appreciated.  No midline C or T-spine or L-spine tenderness noted. Chest:     Chest wall: Tenderness present.  Abdominal:     General: Bowel sounds are normal.      Palpations: Abdomen is soft.     Tenderness: There is no abdominal tenderness.  Musculoskeletal:        General: Tenderness present. No deformity.     Cervical back: Neck supple. No tenderness.     Comments: No long bone tenderness - upper and lower extrmeities and no pelvic pain, instability.  Skin:    General: Skin is warm and dry.     Findings: No bruising.  Neurological:     Mental Status: She is alert and oriented to person, place, and time.     ED Results / Procedures / Treatments   Labs (all labs ordered are listed, but only abnormal results are displayed) Labs Reviewed - No data to display  EKG None  Radiology No results found.  Procedures Procedures    Medications Ordered in ED Medications  lidocaine (LIDODERM) 5 % 1 patch (1 patch Transdermal Patch Applied 05/06/23 1648)  oxyCODONE-acetaminophen (PERCOCET/ROXICET) 5-325 MG per tablet 1 tablet (1 tablet Oral Given 05/06/23 1647)    ED Course/ Medical Decision Making/ A&P                                 Medical Decision Making Amount and/or Complexity of Data Reviewed Radiology: ordered.  Risk OTC drugs. Prescription drug management.   82 year old patient comes in after sustaining what appears to be a mechanical fall.  Patient effectively slipped from her bed, in the process she struck her torso to the side of the bed frame.  Patient did not have any direct blunt trauma to the head. Pertinent past medical includes no blood thinner use. Collateral history provided by patient's husband, who is also at the bedside.  Based on my history and exam, differential diagnosis includes: -Rib fractures -Pneumothorax -Lung contusion -Chest wall contusions - Soft tissue injury  Based on the initial assessment, the following workup was initiated x-ray of the chest with ribs. I shared with the patient that we could also get CT without contrast, however even if there is finding of rib fracture, the treatment  would not change.  Patient prefers getting an x-ray rather than CT scan and that setting.  I have independently interpreted the following imaging from the perspective of acute trauma: X-ray of the ribs and the results indicate no pneumothorax, I do not see any severe rib fractures.  No displacement noted.  X-rays have not been read yet.  Patient wants to leave.  Discussed return precautions.  She wants something strong for pain.  She is on  Ativan at nighttime.  We will give her Percocet as needed, but advised that she does not take that with Ativan.    Final Clinical Impression(s) / ED Diagnoses Final diagnoses:  None    Rx / DC Orders ED Discharge Orders     None         Derwood Kaplan, MD 05/06/23 1835

## 2023-05-20 ENCOUNTER — Ambulatory Visit: Payer: Medicare HMO | Admitting: Internal Medicine

## 2023-05-20 VITALS — BP 122/70 | HR 103 | Temp 98.0°F | Resp 18 | Ht 62.0 in | Wt 130.0 lb

## 2023-05-20 DIAGNOSIS — F331 Major depressive disorder, recurrent, moderate: Secondary | ICD-10-CM

## 2023-05-20 DIAGNOSIS — R0781 Pleurodynia: Secondary | ICD-10-CM | POA: Insufficient documentation

## 2023-05-20 DIAGNOSIS — F02A0 Dementia in other diseases classified elsewhere, mild, without behavioral disturbance, psychotic disturbance, mood disturbance, and anxiety: Secondary | ICD-10-CM

## 2023-05-20 DIAGNOSIS — I10 Essential (primary) hypertension: Secondary | ICD-10-CM

## 2023-05-20 MED ORDER — LIDOCAINE 5 % EX PTCH: 1.0000 | MEDICATED_PATCH | CUTANEOUS | 0 refills | Status: AC

## 2023-05-20 NOTE — Assessment & Plan Note (Signed)
 She will apply lidoderm patch to tender area and take one aleeve and tylenol arthritis

## 2023-05-20 NOTE — Assessment & Plan Note (Signed)
 controlled

## 2023-05-20 NOTE — Assessment & Plan Note (Signed)
 She says that she is better and is not crying that much.

## 2023-05-20 NOTE — Assessment & Plan Note (Signed)
 She stopped using donepezil but she is feeling ok right now, will readdress on rest visit.

## 2023-05-20 NOTE — Progress Notes (Addendum)
 Office Visit  Subjective   Patient ID: Shirley Woods   DOB: 1940-11-28   Age: 83 y.o.   MRN: [id number]   Chief Complaint Chief Complaint  Patient presents with   Follow-up    1 month follow up     History of Present Illness 83 years old female who was seen for follow up.  She fell from bed and hurt her left flank with the edge of bed.  Her husband took her to the emergency room.  There was no fracture she was given Lidoderm  but her husband says that he never received that medicine.  She is still hurt in her left lateral lower rib area.  She wanted to know how long will it take for pain to get better.    She says that her depression is better and she does not cry as often that she used to cry.  She is getting sertraline  100 mg in the morning and that is helping.    Her husband did not notice any difference with donepezil  10 mg and her forgetfulness is slowly getting worse per husband.   She also has pseudobulbar palsy, and previous history of subarachnoid hemorrhage.  She could not afford Nuedexta .  He says that her crying episodes are also better.  She has insomnia and anxiety and has been using lorazepam  for years.  I have decreased the dose from 2 mg to 1 mg at nighttime and half tablets as needed.  She has hypertension, and her blood pressure is stable on amlodipine  5 mg daily.  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   HENT: Negative.    Respiratory: Negative.    Cardiovascular:  Positive for chest pain.  Psychiatric/Behavioral: Negative.         Objective:    Vitals BP 122/70 (BP Location: Left Arm, Patient Position: Sitting)   Pulse (!) 103   Temp 98 F (36.7 C)    Resp 18   Ht 5' 2 (1.575 m)   Wt 130 lb (59 kg)   SpO2 97%   BMI 23.78 kg/m    Physical Examination Physical Exam Constitutional:      Appearance: Normal appearance.  HENT:     Head: Normocephalic and atraumatic.  Cardiovascular:     Rate and Rhythm: Normal rate and regular rhythm.     Heart sounds: Normal heart sounds.  Pulmonary:     Effort: Pulmonary effort is normal.  Chest:     Chest wall: Tenderness present.  Abdominal:     General: Bowel sounds are normal.     Palpations: Abdomen is soft.  Neurological:     General: No focal deficit present.     Mental Status: She is alert and oriented to person, place, and time.        Assessment & Plan:   Hypertension controlled  Dementia (HCC) She stopped using donepezil  but she is feeling ok right now, will readdress on rest visit.  Moderate episode of recurrent major depressive disorder (HCC) She says that she is better and is not crying that much.  Rib pain on left side She will apply lidoderm  patch to tender area  and take one aleeve and tylenol  arthritis    Return in about 3 months (around 08/18/2023).   Roetta Dare, MD

## 2023-05-27 ENCOUNTER — Ambulatory Visit: Payer: Medicare HMO | Admitting: Internal Medicine

## 2023-05-27 VITALS — BP 120/60 | HR 81 | Temp 97.8°F | Resp 18 | Ht 62.0 in | Wt 129.0 lb

## 2023-05-27 DIAGNOSIS — R0781 Pleurodynia: Secondary | ICD-10-CM

## 2023-05-27 MED ORDER — LIDOCAINE 5 % EX PTCH: 1.0000 | MEDICATED_PATCH | Freq: Two times a day (BID) | CUTANEOUS | 0 refills | Status: AC

## 2023-05-27 NOTE — Progress Notes (Signed)
   Acute Office Visit  Subjective:     Patient ID: Shirley Woods, female    DOB: 11/14/1940, 83 y.o.   MRN: [id number]  Chief Complaint  Patient presents with   office visit    Ongoing left side pain post fall a few weeks ago     HPI Patient is in today for area left lower rib cage.   ROS      Objective:    BP 120/60 (BP Location: Left Arm, Patient Position: Sitting, Cuff Size: Normal)   Pulse 81   Temp 97.8 F (36.6 C)   Resp 18   Ht 5' 2 (1.575 m)   Wt 129 lb (58.5 kg)   SpO2 98%   BMI 23.59 kg/m    Physical Exam  No results found for any visits on 05/27/23.      Assessment & Plan:   Problem List Items Addressed This Visit   None   No orders of the defined types were placed in this encounter.   No follow-ups on file.  Roetta Dare, MD

## 2023-05-30 ENCOUNTER — Other Ambulatory Visit: Payer: Self-pay

## 2023-07-05 ENCOUNTER — Other Ambulatory Visit: Payer: Self-pay | Admitting: Internal Medicine

## 2023-07-07 ENCOUNTER — Other Ambulatory Visit: Payer: Self-pay | Admitting: Internal Medicine

## 2023-07-07 MED ORDER — LORAZEPAM 1 MG PO TABS: ORAL_TABLET | ORAL | 4 refills | Status: DC

## 2023-07-23 ENCOUNTER — Other Ambulatory Visit: Payer: Self-pay | Admitting: Internal Medicine

## 2023-08-10 ENCOUNTER — Other Ambulatory Visit: Payer: Self-pay | Admitting: Internal Medicine

## 2023-08-12 ENCOUNTER — Encounter: Payer: Self-pay | Admitting: Internal Medicine

## 2023-08-12 ENCOUNTER — Ambulatory Visit: Payer: Medicare HMO | Admitting: Internal Medicine

## 2023-08-12 VITALS — BP 126/80 | HR 85 | Resp 18 | Ht 62.0 in | Wt 137.0 lb

## 2023-08-12 DIAGNOSIS — F331 Major depressive disorder, recurrent, moderate: Secondary | ICD-10-CM

## 2023-08-12 DIAGNOSIS — K5901 Slow transit constipation: Secondary | ICD-10-CM | POA: Insufficient documentation

## 2023-08-12 DIAGNOSIS — I10 Essential (primary) hypertension: Secondary | ICD-10-CM

## 2023-08-12 DIAGNOSIS — F419 Anxiety disorder, unspecified: Secondary | ICD-10-CM | POA: Diagnosis not present

## 2023-08-12 DIAGNOSIS — F5101 Primary insomnia: Secondary | ICD-10-CM | POA: Diagnosis not present

## 2023-08-12 MED ORDER — SERTRALINE HCL 100 MG PO TABS: 150.0000 mg | ORAL_TABLET | Freq: Every day | ORAL | 6 refills | Status: DC

## 2023-08-12 MED ORDER — LINACLOTIDE 72 MCG PO CAPS: 72.0000 ug | ORAL_CAPSULE | Freq: Every day | ORAL | 3 refills | Status: DC

## 2023-08-12 MED ORDER — QUETIAPINE FUMARATE 25 MG PO TABS: 25.0000 mg | ORAL_TABLET | Freq: Every day | ORAL | 1 refills | Status: DC

## 2023-08-12 NOTE — Assessment & Plan Note (Signed)
 I will decrease the dose of lorazepam to 1 mg and start her on Seroquel 25 mg at nighttime.  I will also stop aripiprazole.  I will reevaluate after 2 weeks.

## 2023-08-12 NOTE — Assessment & Plan Note (Signed)
 Controlled.

## 2023-08-12 NOTE — Assessment & Plan Note (Signed)
 Her depression is partially treated.  I will increase dose of sertraline to 250 mg daily.  I will stop aripiprazole and start seroquil 25 mg at night.

## 2023-08-12 NOTE — Assessment & Plan Note (Signed)
 I will strart linzess 72 mg daily

## 2023-08-12 NOTE — Progress Notes (Signed)
   Office Visit  Subjective   Patient ID: Shirley Woods   DOB: Apr 15, 1941   Age: 83 y.o.   MRN: [id number]   Chief Complaint Chief Complaint  Patient presents with   Follow-up    3 month follow up     History of Present Illness 83 years old female who is here for follow-up with her husband.  Per husband she is still depressed in spite of taking sertraline 100 mg and aripiprazole 2 mg in the morning.  Per husband she is okay during morning time but afternoon she is tearful and does not enjoy things that she normally used to do like going for exercise classes.  She also take lorazepam 1.5 mg at nighttime and that help her sleep.  She says that otherwise she is feeling better.  She does not have any suicidal ideation.    She has hypertension and take amlodipine 5 mg daily.  Her blood pressure is controlled.    She is complaining of constipation in spite of taking some laxative.  She says that it will be nice if she has a regular bowel movement.  No abdominal pain.  She also has pseudobulbar palsy, and previous history of subarachnoid hemorrhage.  She could not afford Nuedexta.   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   HENT: Negative.    Respiratory: Negative.    Cardiovascular: Negative.   Gastrointestinal: Negative.   Neurological: Negative.        Objective:    Vitals BP 126/80 (BP Location: Left Arm, Patient Position: Sitting, Cuff Size: Normal)   Pulse 85   Resp 18   Ht 5\' 2"  (1.575 m)   Wt 137 lb (62.1 kg)   SpO2 96%   BMI 25.06 kg/m    Physical Examination Physical Exam Constitutional:      Appearance: Normal appearance.  HENT:     Head: Normocephalic and  atraumatic.  Cardiovascular:     Rate and Rhythm: Normal rate and regular rhythm.     Heart sounds: Normal heart sounds.  Pulmonary:     Effort: Pulmonary effort is normal.     Breath sounds: Normal breath sounds.  Abdominal:     General: Bowel sounds are normal.     Palpations: Abdomen is soft.  Neurological:     General: No focal deficit present.     Mental Status: She is alert and oriented to person, place, and time.        Assessment & Plan:   Hypertension Controlled  Slow transit constipation I will strart linzess 72 mg daily  Primary insomnia I will decrease the dose of lorazepam to 1 mg and start her on Seroquel 25 mg at nighttime.  I will also stop aripiprazole.  I will reevaluate after 2 weeks.  Moderate episode of recurrent major depressive disorder (HCC) Her depression is partially treated.  I will increase dose of sertraline to 250 mg daily.  I will stop aripiprazole and start seroquil 25 mg at night.    Return in about 2 weeks (around 08/26/2023).   Eloisa Northern, MD

## 2023-08-14 DIAGNOSIS — H353131 Nonexudative age-related macular degeneration, bilateral, early dry stage: Secondary | ICD-10-CM | POA: Diagnosis not present

## 2023-08-14 DIAGNOSIS — H524 Presbyopia: Secondary | ICD-10-CM | POA: Diagnosis not present

## 2023-08-26 ENCOUNTER — Ambulatory Visit: Admitting: Internal Medicine

## 2023-08-26 ENCOUNTER — Encounter: Payer: Self-pay | Admitting: Internal Medicine

## 2023-08-26 VITALS — BP 118/78 | HR 86 | Temp 97.6°F | Resp 18 | Ht 62.0 in | Wt 132.1 lb

## 2023-08-26 DIAGNOSIS — F419 Anxiety disorder, unspecified: Secondary | ICD-10-CM

## 2023-08-26 DIAGNOSIS — I10 Essential (primary) hypertension: Secondary | ICD-10-CM | POA: Diagnosis not present

## 2023-08-26 DIAGNOSIS — F5101 Primary insomnia: Secondary | ICD-10-CM | POA: Diagnosis not present

## 2023-08-26 DIAGNOSIS — F331 Major depressive disorder, recurrent, moderate: Secondary | ICD-10-CM

## 2023-08-26 DIAGNOSIS — K5901 Slow transit constipation: Secondary | ICD-10-CM | POA: Diagnosis not present

## 2023-08-26 MED ORDER — QUETIAPINE FUMARATE 50 MG PO TABS: 50.0000 mg | ORAL_TABLET | Freq: Every day | ORAL | 2 refills | Status: DC

## 2023-08-26 MED ORDER — LINACLOTIDE 145 MCG PO CAPS: 145.0000 ug | ORAL_CAPSULE | Freq: Every day | ORAL | 5 refills | Status: DC

## 2023-08-26 NOTE — Progress Notes (Signed)
 Office Visit  Subjective   Patient ID: Shirley Woods   DOB: 1941/05/07   Age: 83 y.o.   MRN: [id number]   Chief Complaint Chief Complaint  Patient presents with   Follow-up    2 week follow up     History of Present Illness 83 years old female who is here for follow-up with her husband.   She was seen by me along with our psych nurse practitioner Yaakov Heir. Per husband she does not participate in activities that she used to enjoy, she does not go to exercise classes with him either.  Patient admitted that she does not feel like going out.  She is still depressed, but since she is taking sertraline  100 mg and aripiprazole  2 mg in the morning, she is slightly better but she still has to take 1 and half tablets of alprazolam  at nighttime to sleep.  I have tried to taper alprazolam  off because of risk associated with this medicine in her age.  But I was not able to wean her off. She says that otherwise she is feeling better.  She does not have any suicidal ideation.     She has hypertension and take amlodipine  5 mg daily.  Her blood pressure is controlled.     She is complaining of constipation in spite of taking  Linzess  72 mg daily.  She says that it will be nice if she has a regular bowel movement.  No abdominal pain.   She also has pseudobulbar palsy, and previous history of subarachnoid hemorrhage.  She could not afford Nuedexta .   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   HENT: Negative.    Respiratory: Negative.    Cardiovascular: Negative.   Gastrointestinal: Negative.   Neurological: Negative.   Psychiatric/Behavioral:  Positive for depression.        Objective:    Vitals BP  118/78 (BP Location: Left Arm, Patient Position: Sitting, Cuff Size: Normal)   Pulse 86   Temp 97.6 F (36.4 C)   Resp 18   Ht 5\' 2"  (1.575 m)   Wt 132 lb 2 oz (59.9 kg)   SpO2 95%   BMI 24.17 kg/m    Physical Examination Physical Exam Constitutional:      Appearance: Normal appearance.  HENT:     Head: Normocephalic and atraumatic.  Cardiovascular:     Rate and Rhythm: Normal rate and regular rhythm.     Heart sounds: Normal heart sounds.  Neurological:     Mental Status: She is alert.       Assessment & Plan:   Moderate episode of recurrent major depressive disorder (HCC)  I will continue with sertraline  100 mg daily and will change aripiprazole  to Seroquel  25 mg at nighttime.  After few days will increase the dose of Seroquel  to 50 mg at night and decrease the dose of alprazolam  to 1 mg. Will slowly taper alprazolam  off.  Primary insomnia   I will start her on Seroquel  for insomnia and adjunk therapy for depression.  Hypertension   Her blood pressure is controlled.  Slow transit constipation   I will increase the dose of Linzess  to  145 mg daily.    Return in about 1 month (around 09/25/2023).   Tita Form, MD

## 2023-09-16 NOTE — Assessment & Plan Note (Signed)
 I will continue with sertraline  100 mg daily and will change aripiprazole  to Seroquel  25 mg at nighttime.  After few days will increase the dose of Seroquel  to 50 mg at night and decrease the dose of alprazolam  to 1 mg. Will slowly taper alprazolam  off.

## 2023-09-16 NOTE — Assessment & Plan Note (Signed)
 Her blood pressure is controlled.

## 2023-09-16 NOTE — Assessment & Plan Note (Signed)
 I will increase the dose of Linzess  to 145 mg daily.

## 2023-09-16 NOTE — Assessment & Plan Note (Signed)
 I will start her on Seroquel  for insomnia and adjunk therapy for depression.

## 2023-09-23 ENCOUNTER — Ambulatory Visit: Admitting: Internal Medicine

## 2023-09-23 ENCOUNTER — Encounter: Payer: Self-pay | Admitting: Internal Medicine

## 2023-09-23 VITALS — BP 124/80 | HR 74 | Temp 98.1°F | Resp 18 | Ht 62.0 in | Wt 130.4 lb

## 2023-09-23 DIAGNOSIS — G300 Alzheimer's disease with early onset: Secondary | ICD-10-CM | POA: Diagnosis not present

## 2023-09-23 DIAGNOSIS — F331 Major depressive disorder, recurrent, moderate: Secondary | ICD-10-CM

## 2023-09-23 DIAGNOSIS — G4700 Insomnia, unspecified: Secondary | ICD-10-CM | POA: Insufficient documentation

## 2023-09-23 DIAGNOSIS — K5901 Slow transit constipation: Secondary | ICD-10-CM | POA: Diagnosis not present

## 2023-09-23 DIAGNOSIS — I10 Essential (primary) hypertension: Secondary | ICD-10-CM

## 2023-09-23 DIAGNOSIS — F419 Anxiety disorder, unspecified: Secondary | ICD-10-CM

## 2023-09-23 DIAGNOSIS — F02A0 Dementia in other diseases classified elsewhere, mild, without behavioral disturbance, psychotic disturbance, mood disturbance, and anxiety: Secondary | ICD-10-CM

## 2023-09-23 NOTE — Assessment & Plan Note (Signed)
 Will readdress after sleeping and anxiety issues resolved

## 2023-09-23 NOTE — Assessment & Plan Note (Signed)
 Continue with Linzess  145 mg daily.

## 2023-09-23 NOTE — Assessment & Plan Note (Signed)
 She will continue with sertraline  150 mg daily and Seroquel  50 mg daily.

## 2023-09-23 NOTE — Assessment & Plan Note (Signed)
 She will decrease the dose of lorazepam  to 0.5 mg daily and continue with the Seroquel  50 mg daily.

## 2023-09-23 NOTE — Progress Notes (Signed)
   Office Visit  Subjective   Patient ID: Shirley Woods   DOB: October 23, 1940   Age: 83 y.o.   MRN: [id number]   Chief Complaint No chief complaint on file.    History of Present Illness 83 years old female who is here for follow-up with her husband.   She says that she is feeling better she is sleeping better.  She is not depressed, she started going out with her husband.  She is currently taking sertraline  150 mg daily and Seroquel  50 mg daily.  No side effects from these medications.   I have stopped aripiprazole  2 mg last month.  She is also taking lorazepam  1 mg daily and [facility] trying to slowly wean that off.  But I was not able to wean her off. She says that otherwise she is feeling better.  She does not have any suicidal ideation.     She has hypertension and take amlodipine  5 mg daily.  Her blood pressure is controlled.     She says that her constipation is better.  He usually she has some diarrhea that required her to take Imodium.  But she is doing a lot better now.  She was started on Linzess  145 mg daily. No abdominal pain.   She also has pseudobulbar palsy, and previous history of subarachnoid hemorrhage.  She could not afford Nuedexta .   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   HENT: Negative.    Respiratory: Negative.    Cardiovascular: Negative.   Gastrointestinal: Negative.   Neurological: Negative.        Objective:    Vitals There were no vitals taken for this visit.   Physical Examination Physical Exam Constitutional:      Appearance: Normal appearance.  HENT:     Head: Normocephalic and atraumatic.  Cardiovascular:     Rate and Rhythm: Normal rate and regular  rhythm.     Heart sounds: Normal heart sounds.  Pulmonary:     Effort: Pulmonary effort is normal.     Breath sounds: Normal breath sounds.  Neurological:     General: No focal deficit present.     Mental Status: She is alert and oriented to person, place, and time.        Assessment & Plan:  Moderate episode of recurrent major depressive disorder (HCC)  I will continue with sertraline  150 mg daily and Seroquel  50 mg at nighttime.  After few days will increase the dose of Seroquel  to 50 mg at night and decrease the dose of alprazolam  to 1 mg. Will slowly taper alprazolam  off.   Primary insomnia   I will start her on Seroquel  50 mg daily.   Hypertension   Her blood pressure is controlled.   Slow transit constipation   I will continue Linzess  145 mg daily.      Return in about 2 months (around 11/23/2023).   Tita Form, MD

## 2023-09-23 NOTE — Assessment & Plan Note (Signed)
 Stable

## 2023-10-15 ENCOUNTER — Other Ambulatory Visit: Payer: Self-pay | Admitting: Internal Medicine

## 2023-10-20 ENCOUNTER — Other Ambulatory Visit: Payer: Self-pay

## 2023-10-20 MED ORDER — QUETIAPINE FUMARATE 50 MG PO TABS: 50.0000 mg | ORAL_TABLET | Freq: Two times a day (BID) | ORAL | 3 refills | Status: DC

## 2023-10-20 MED ORDER — QUETIAPINE FUMARATE 50 MG PO TABS: 50.0000 mg | ORAL_TABLET | Freq: Every day | ORAL | 2 refills | Status: DC

## 2023-10-22 ENCOUNTER — Other Ambulatory Visit: Payer: Self-pay | Admitting: Internal Medicine

## 2023-11-18 ENCOUNTER — Other Ambulatory Visit: Payer: Self-pay | Admitting: Internal Medicine

## 2023-11-25 ENCOUNTER — Ambulatory Visit: Admitting: Internal Medicine

## 2023-12-02 ENCOUNTER — Ambulatory Visit: Admitting: Internal Medicine

## 2023-12-23 ENCOUNTER — Ambulatory Visit: Admitting: Internal Medicine

## 2023-12-23 ENCOUNTER — Encounter: Payer: Self-pay | Admitting: Internal Medicine

## 2023-12-23 VITALS — BP 126/60 | HR 76 | Temp 98.0°F | Resp 18 | Wt 130.1 lb

## 2023-12-23 DIAGNOSIS — G4700 Insomnia, unspecified: Secondary | ICD-10-CM

## 2023-12-23 DIAGNOSIS — F331 Major depressive disorder, recurrent, moderate: Secondary | ICD-10-CM | POA: Diagnosis not present

## 2023-12-23 DIAGNOSIS — I10 Essential (primary) hypertension: Secondary | ICD-10-CM

## 2023-12-23 DIAGNOSIS — F419 Anxiety disorder, unspecified: Secondary | ICD-10-CM

## 2023-12-23 DIAGNOSIS — F482 Pseudobulbar affect: Secondary | ICD-10-CM | POA: Diagnosis not present

## 2023-12-23 NOTE — Progress Notes (Signed)
   Office Visit  Subjective   Patient ID: Shirley Woods   DOB: 11/22/1940   Age: 83 y.o.   MRN: [id number]   Chief Complaint Chief Complaint  Patient presents with   office visit    Patient here for depression      History of Present Illness 83 years old female who is here for follow-up with her husband. She says that she is very depressed and is crying in our office.   She says that since she has intracranial bleed she has this kind of episodes.  Per husband she does not wanted to do anything.  She take sertraline  100 mg daily and he is not a sure about Seroquel  that she was supposed to take 50 mg twice a day. She still take lorazepam  1.5 mg at nighttime and  she sleeps good.   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   She has hypertension and take amlodipine  5 mg daily.  Her blood pressure is controlled.     She says that her constipation is better.  He usually she has some diarrhea that required her to take Imodium.  But she is doing a lot better now.  She was started on Linzess  145 mg daily. No abdominal pain.     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   Respiratory: Negative.    Cardiovascular: Negative.   Psychiatric/Behavioral:  Positive for depression.        Objective:    Vitals BP 126/60   Pulse 76   Temp 98 F (36.7 C)   Resp 18   Wt 130 lb 2 oz (59 kg)   SpO2 97%   BMI 23.80 kg/m    Physical Examination Physical Exam Constitutional:      Appearance: Normal appearance.  HENT:     Head: Normocephalic and atraumatic.  Cardiovascular:     Rate and Rhythm: Normal rate and regular rhythm.      Heart sounds: Normal heart sounds.  Pulmonary:     Effort: Pulmonary effort is normal.     Breath sounds: Normal breath sounds.  Neurological:     General: No focal deficit present.     Mental Status: She is alert and oriented to person, place, and time.        Assessment & Plan:   Hypertension   Her blood pressure is controlled.  Moderate episode of recurrent major depressive disorder (HCC)   She has been taking Seroquel  50 mg at nighttime and sertraline  100 mg daily.  I have suggested to increase Seroquel  to 100 mg at nighttime.  Insomnia   She is still using lorazepam  1 mg 1 and half tablet at nighttime and per husband she sleeps good.  Pseudobulbar affect   She has episodes of crying but otherwise she will feel good but today she tells me that she is depressed and does not wanted to leave home.  So this is different crying this time.    Return in about 4 weeks (around 01/20/2024).   Roetta Dare, MD

## 2023-12-24 NOTE — Assessment & Plan Note (Signed)
 She has been taking Seroquel  50 mg at nighttime and sertraline  100 mg daily.  I have suggested to increase Seroquel  to 100 mg at nighttime.

## 2023-12-24 NOTE — Assessment & Plan Note (Signed)
 She has episodes of crying but otherwise she will feel good but today she tells me that she is depressed and does not wanted to leave home.  So this is different crying this time.

## 2023-12-24 NOTE — Assessment & Plan Note (Signed)
 She is still using lorazepam  1 mg 1 and half tablet at nighttime and per husband she sleeps good.

## 2023-12-24 NOTE — Assessment & Plan Note (Signed)
 Her blood pressure is controlled.

## 2023-12-25 ENCOUNTER — Other Ambulatory Visit: Payer: Self-pay | Admitting: Internal Medicine

## 2024-01-23 ENCOUNTER — Other Ambulatory Visit: Payer: Self-pay | Admitting: Internal Medicine

## 2024-01-23 MED ORDER — LORAZEPAM 1 MG PO TABS: ORAL_TABLET | ORAL | 4 refills | Status: AC

## 2024-01-23 NOTE — Telephone Encounter (Signed)
Message given to dr Nelson Chimes

## 2024-02-10 ENCOUNTER — Other Ambulatory Visit: Payer: Self-pay | Admitting: Internal Medicine

## 2024-02-10 ENCOUNTER — Ambulatory Visit: Admitting: Internal Medicine

## 2024-02-10 ENCOUNTER — Encounter: Payer: Self-pay | Admitting: Internal Medicine

## 2024-02-10 VITALS — BP 124/78 | HR 88 | Temp 97.8°F | Resp 18 | Ht 62.0 in | Wt 126.2 lb

## 2024-02-10 DIAGNOSIS — Z6823 Body mass index (BMI) 23.0-23.9, adult: Secondary | ICD-10-CM

## 2024-02-10 DIAGNOSIS — I10 Essential (primary) hypertension: Secondary | ICD-10-CM | POA: Diagnosis not present

## 2024-02-10 DIAGNOSIS — F331 Major depressive disorder, recurrent, moderate: Secondary | ICD-10-CM | POA: Diagnosis not present

## 2024-02-10 DIAGNOSIS — G4700 Insomnia, unspecified: Secondary | ICD-10-CM | POA: Diagnosis not present

## 2024-02-10 DIAGNOSIS — F482 Pseudobulbar affect: Secondary | ICD-10-CM | POA: Diagnosis not present

## 2024-02-10 DIAGNOSIS — Z0000 Encounter for general adult medical examination without abnormal findings: Secondary | ICD-10-CM | POA: Diagnosis not present

## 2024-02-10 NOTE — Progress Notes (Signed)
 Office Visit  Subjective   Patient ID: Shirley Woods   DOB: [birth year]   Age: 83 y.o.   MRN: [id number]   Chief Complaint Chief Complaint  Patient presents with   Annual Exam    Medicare annual exam      History of Present Illness 83 years old female is here for annual wellness examination. She is married, she does not smoke, she occasionally drink.  She has no fall, she get flu shot every year. She has shingle and pneumonia vaccine. She has tetanus vaccine was more than 10 years ago. She has COVID vaccine and booster.   She score 26/30 on MMSE and she score 6 on PHQ-9 test. She examine her breast and no lumps and bump.     Per husband she is depressed, she tells me she started crying for no reason.  She has a history of intracranial bleed in the past.  I believe she has a pseudobulbar affect so I have given him no tack stress sample that has helped but that was too expensive.    She also has difficulty falling asleep and she has been using lorazepam  2 mg for long time.  We were able to manage that to 1-1 and half tablet at night.  She take sertraline  100 mg daily for depression and we have started her on Seroquel  50 mg twice a day.    She also has hypertension and her blood pressure is controlled.  She takes amlodipine  5 mg daily.    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   HENT: Negative.    Respiratory: Negative.    Cardiovascular: Negative.   Gastrointestinal: Negative.   Neurological: Negative.        Objective:    Vitals BP 124/78   Pulse 88   Temp 97.8 F (36.6 C)   Resp 18   Ht 5' 2 (1.575 m)   Wt 126 lb 4 oz (57.3 kg)   SpO2 99%   BMI 23.09 kg/m     Physical Examination Physical Exam Constitutional:      Appearance: Normal appearance.  HENT:     Head: Normocephalic and atraumatic.  Eyes:     Extraocular Movements: Extraocular movements intact.     Pupils: Pupils are equal, round, and reactive to light.  Cardiovascular:     Rate and Rhythm: Normal rate and regular rhythm.     Heart sounds: Normal heart sounds.  Pulmonary:     Effort: Pulmonary effort is normal.     Breath sounds: Normal breath sounds.  Abdominal:     Palpations: Abdomen is soft.  Neurological:     General: No focal deficit present.     Mental Status: She is alert and oriented to person, place, and time.        Assessment & Plan:   Hypertension   Her blood pressure is well controlled on amlodipine  5 mg daily.  Well adult exam   She will get flu shot when is available here at [facility].  I will schedule her for bone density scan.  Pseudobulbar affect   As she has a history of intracranial hemorrhage and  she may very well be having pseudobulbar affect while suddenly she started crying  Versus uncontrolled depression even though she takes sertraline  100 mg daily and Seroquel  50 mg twice a day.  Moderate episode of recurrent major depressive disorder (HCC)   She scores 6 on PHQ 9 test.  She takes sertraline  100 mg daily and Seroquel  50 mg twice a day. .  Insomnia   Husband is trying to cut back on her lorazepam  but she still has to use it 1-1.5 mg at night.    Return in about 3 months (around 05/11/2024).   Roetta Dare, MD

## 2024-02-11 LAB — COMPREHENSIVE METABOLIC PANEL WITH GFR
ALT: 18 IU/L (ref 0–32)
AST: 23 IU/L (ref 0–40)
Albumin: 4.4 g/dL (ref 3.7–4.7)
Alkaline Phosphatase: 88 IU/L (ref 48–129)
BUN/Creatinine Ratio: 16 (ref 12–28)
BUN: 13 mg/dL (ref 8–27)
Bilirubin Total: 0.4 mg/dL (ref 0.0–1.2)
CO2: 23 mmol/L (ref 20–29)
Calcium: 10.6 mg/dL — ABNORMAL HIGH (ref 8.7–10.3)
Chloride: 104 mmol/L (ref 96–106)
Creatinine, Ser: 0.81 mg/dL (ref 0.57–1.00)
Globulin, Total: 2.7 g/dL (ref 1.5–4.5)
Glucose: 92 mg/dL (ref 70–99)
Potassium: 5.1 mmol/L (ref 3.5–5.2)
Sodium: 142 mmol/L (ref 134–144)
Total Protein: 7.1 g/dL (ref 6.0–8.5)
eGFR: 73 mL/min/1.73 (ref >59)

## 2024-02-11 LAB — LIPID PANEL W/O CHOL/HDL RATIO
Cholesterol, Total: 265 mg/dL — ABNORMAL HIGH (ref 100–199)
HDL: 67 mg/dL (ref >39)
LDL Chol Calc (NIH): 167 mg/dL — ABNORMAL HIGH (ref 0–99)
Triglycerides: 174 mg/dL — ABNORMAL HIGH (ref 0–149)
VLDL Cholesterol Cal: 31 mg/dL (ref 5–40)

## 2024-02-11 LAB — HGB A1C W/O EAG: Hgb A1c MFr Bld: 5.7 % — ABNORMAL HIGH (ref 4.8–5.6)

## 2024-02-15 NOTE — Assessment & Plan Note (Signed)
 She will get flu shot when is available here at [facility].  I will schedule her for bone density scan.

## 2024-02-15 NOTE — Assessment & Plan Note (Signed)
 Husband is trying to cut back on her lorazepam  but she still has to use it 1-1.5 mg at night.

## 2024-02-15 NOTE — Assessment & Plan Note (Signed)
 As she has a history of intracranial hemorrhage and she may very well be having pseudobulbar affect while suddenly she started crying  Versus uncontrolled depression even though she takes sertraline  100 mg daily and Seroquel  50 mg twice a day.

## 2024-02-15 NOTE — Assessment & Plan Note (Signed)
 Her blood pressure is well controlled on amlodipine  5 mg daily.

## 2024-02-15 NOTE — Assessment & Plan Note (Signed)
 She scores 6 on PHQ 9 test.  She takes sertraline  100 mg daily and Seroquel  50 mg twice a day. Shirley Woods

## 2024-03-15 ENCOUNTER — Other Ambulatory Visit: Payer: Self-pay | Admitting: Internal Medicine

## 2024-03-17 ENCOUNTER — Other Ambulatory Visit: Payer: Self-pay | Admitting: Internal Medicine

## 2024-03-17 MED ORDER — LAMOTRIGINE 25 MG PO TABS: 25.0000 mg | ORAL_TABLET | Freq: Every day | ORAL | 2 refills | Status: DC

## 2024-03-21 ENCOUNTER — Other Ambulatory Visit: Payer: Self-pay | Admitting: Internal Medicine

## 2024-03-23 ENCOUNTER — Ambulatory Visit: Admitting: Internal Medicine

## 2024-03-23 ENCOUNTER — Encounter: Payer: Self-pay | Admitting: Internal Medicine

## 2024-03-23 VITALS — BP 124/80 | HR 93 | Temp 98.1°F | Resp 18 | Ht 63.0 in | Wt 130.4 lb

## 2024-03-23 DIAGNOSIS — R6889 Other general symptoms and signs: Secondary | ICD-10-CM

## 2024-03-23 DIAGNOSIS — F331 Major depressive disorder, recurrent, moderate: Secondary | ICD-10-CM

## 2024-03-29 NOTE — Progress Notes (Signed)
   Office Visit  Subjective   Patient ID: Shirley Woods   DOB: Feb 25, 1941   Age: 83 y.o.   MRN: [id number]   Chief Complaint Chief Complaint  Patient presents with   Follow-up    Follow up     History of Present Illness   83 years old female who is here for follow-up with his wife.  He is worried that his wife is getting forgetful.  She is also depressed and crying most of the time.  I have sent her a prescription for Lamictal  but he says that he has picked up that medicine but have not started her yet.  She still takes Seroquel  50 mg twice a day.  She also takes sertraline  100 mg daily.  I have discussed with her that it is important to control her depression and then will re-evaluate her memory.  She may very well have vascular dementia as her symptoms started since she has intravascular hemorrhage.  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   Psychiatric/Behavioral:  Positive for depression and memory loss. The patient has insomnia.        Objective:    Vitals BP 124/80   Pulse 93   Temp 98.1 F (36.7 C)   Resp 18   Ht 5' 3 (1.6 m)   Wt 130 lb 6 oz (59.1 kg)   SpO2 93%   BMI 23.09 kg/m    Physical Examination Physical Exam Constitutional:      Appearance: Normal appearance.  Cardiovascular:     Rate and Rhythm: Normal rate and regular rhythm.     Heart sounds: Normal heart sounds.  Pulmonary:     Effort: Pulmonary effort is normal.     Breath sounds: Normal breath sounds.  Neurological:     Mental Status: She is alert and oriented to person, place, and time. Mental status is at baseline.        Assessment & Plan:   Moderate episode of recurrent major depressive  disorder (HCC)   She will continue with sertraline  and Seroquel  50 mg twice a day.  She will take Lamictal  25 mg at bedtime for 1 week then 2 tablets at nighttime.  If she develop any rash then she will call us .  Forgetfulness   Will re-evaluate her memory on next visit.    No follow-ups on file.   Roetta Dare, MD

## 2024-03-30 ENCOUNTER — Ambulatory Visit: Admitting: Internal Medicine

## 2024-03-30 ENCOUNTER — Other Ambulatory Visit: Payer: Self-pay | Admitting: Internal Medicine

## 2024-04-04 NOTE — Assessment & Plan Note (Signed)
 She will continue with sertraline  and Seroquel  50 mg twice a day.  She will take Lamictal  25 mg at bedtime for 1 week then 2 tablets at nighttime.  If she develop any rash then she will call us .

## 2024-04-04 NOTE — Assessment & Plan Note (Signed)
 Will re-evaluate her memory on next visit.

## 2024-04-06 ENCOUNTER — Encounter: Payer: Self-pay | Admitting: Internal Medicine

## 2024-04-06 ENCOUNTER — Ambulatory Visit: Admitting: Internal Medicine

## 2024-04-06 ENCOUNTER — Other Ambulatory Visit: Payer: Self-pay | Admitting: Internal Medicine

## 2024-04-06 VITALS — BP 122/80 | HR 94 | Temp 98.8°F | Resp 18 | Ht 63.0 in | Wt 128.5 lb

## 2024-04-06 DIAGNOSIS — F331 Major depressive disorder, recurrent, moderate: Secondary | ICD-10-CM | POA: Diagnosis not present

## 2024-04-06 DIAGNOSIS — F5101 Primary insomnia: Secondary | ICD-10-CM

## 2024-04-06 DIAGNOSIS — I10 Essential (primary) hypertension: Secondary | ICD-10-CM

## 2024-04-06 DIAGNOSIS — F03A0 Unspecified dementia, mild, without behavioral disturbance, psychotic disturbance, mood disturbance, and anxiety: Secondary | ICD-10-CM

## 2024-04-06 DIAGNOSIS — F482 Pseudobulbar affect: Secondary | ICD-10-CM

## 2024-04-06 DIAGNOSIS — R6889 Other general symptoms and signs: Secondary | ICD-10-CM | POA: Diagnosis not present

## 2024-04-06 MED ORDER — DONEPEZIL HCL 5 MG PO TABS: 5.0000 mg | ORAL_TABLET | Freq: Every day | ORAL | 3 refills | Status: DC

## 2024-04-06 NOTE — Assessment & Plan Note (Signed)
 She is very unstable twice in our office today 1 time she could not remember any words out of 3 word recall but second time she did 3 out of 3.  I believe she is still depressed.  I will start her on donepezil  5 mg daily.  I will also do B12 and TSH level.

## 2024-04-06 NOTE — Progress Notes (Addendum)
 Office Visit  Subjective   Patient ID: Shirley Woods   DOB: 04/12/1941   Age: 83 y.o.   MRN: [id number]   Chief Complaint Chief Complaint  Patient presents with   Follow-up    Neuro referral     History of Present Illness 83 years old female is here with her husband who is worried that her memory is getting worse, someday she does not even remember who he is. I have started Lamotrigine  25 mg daily for 2 days then 50 mg daily at night. Per husband, he has not noted any difference in her.  She has history of intracranial hemorrhage ad her memory is bad since that time but it is worse now.    Per husband she is depressed, she tells me she started crying for no reason.  She has a history of intracranial bleed in the past.  I believe she has a pseudobulbar affect so I have given him no tack stress sample that has helped but that was too expensive.     She also has difficulty falling asleep and she has been using lorazepam  2 mg for long time.  We were able to manage that to 1-1 and half tablet at night.  She take sertraline  100 mg daily for depression and we have started her on Seroquel  50 mg twice a day.     She also has hypertension and her blood pressure is controlled.  She takes amlodipine  5 mg daily.    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   Respiratory: Negative.    Cardiovascular: Negative.   Psychiatric/Behavioral:  Positive for depression and memory loss.        Objective:    Vitals BP 122/80   Pulse 94   Temp 98.8 F (37.1 C)   Resp 18   Ht 5' 3 (1.6 m)   Wt 128 lb 8 oz (58.3 kg)   SpO2 98%   BMI 22.76 kg/m    Physical Examination Physical  Exam Constitutional:      Appearance: Normal appearance.  Cardiovascular:     Rate and Rhythm: Normal rate and regular rhythm.     Heart sounds: Normal heart sounds.  Pulmonary:     Effort: Pulmonary effort is normal.     Breath sounds: Normal breath sounds.  Abdominal:     General: Bowel sounds are normal.     Palpations: Abdomen is soft.  Neurological:     General: No focal deficit present.     Mental Status: She is alert and oriented to person, place, and time.        Assessment & Plan:   Hypertension His blood pressure is well-controlled.  Dementia (HCC) She is very unstable twice in our office today 1 time she could not remember any words out of 3 word recall but second time she did 3 out of 3.  I believe she is still depressed.  I will start her on donepezil  5 mg daily.  I will also do B12 and TSH level.  Moderate episode of recurrent major depressive disorder (HCC) She is still depressed even though she tells me that she is little  better.  She takes sertraline  100 mg daily and Seroquel  50 mg twice a day.  I have added Lamictal  25 mg at bedtime for 1 week then 2 tablets at nighttime.  Husband is not sure if she is taking 1 tablets or 2 tablets at nighttime.  There is no rash and no other side effect.  Pseudobulbar affect She jus fell and that did help but he could not afford because of cost.  t started crying while regular conversation, I have started her on Neudextra and gave her samples that definitely has helped her but husband could could not afford because of cost.    No follow-ups on file.   Roetta Dare, MD

## 2024-04-06 NOTE — Addendum Note (Signed)
 Addended by: Willis Kuipers on: 04/06/2024 08:43 PM   Modules accepted: Orders

## 2024-04-06 NOTE — Assessment & Plan Note (Signed)
His blood pressure is well-controlled 

## 2024-04-06 NOTE — Assessment & Plan Note (Signed)
 She is still depressed even though she tells me that she is little better.  She takes sertraline  100 mg daily and Seroquel  50 mg twice a day.  I have added Lamictal  25 mg at bedtime for 1 week then 2 tablets at nighttime.  Husband is not sure if she is taking 1 tablets or 2 tablets at nighttime.  There is no rash and no other side effect.

## 2024-04-06 NOTE — Addendum Note (Signed)
 Addended byBETHA HILDEGARD ERNST on: 04/06/2024 03:55 PM   Modules accepted: Orders

## 2024-04-06 NOTE — Assessment & Plan Note (Signed)
 She jus fell and that did help but he could not afford because of cost.  t started crying while regular conversation, I have started her on Neudextra and gave her samples that definitely has helped her but husband could could not afford because of cost.

## 2024-04-07 NOTE — Addendum Note (Signed)
 Addended by: Weslie Pretlow on: 04/07/2024 03:23 PM   Modules accepted: Orders

## 2024-04-12 LAB — CBC WITH DIFFERENTIAL/PLATELET
Basophils Absolute: 0 x10E3/uL (ref 0.0–0.2)
Basos: 1 %
EOS (ABSOLUTE): 0.1 x10E3/uL (ref 0.0–0.4)
Eos: 2 %
Hematocrit: 40.7 % (ref 34.0–46.6)
Hemoglobin: 13.7 g/dL (ref 11.1–15.9)
Immature Grans (Abs): 0 x10E3/uL (ref 0.0–0.1)
Immature Granulocytes: 0 %
Lymphocytes Absolute: 1.2 x10E3/uL (ref 0.7–3.1)
Lymphs: 22 %
MCH: 32 pg (ref 26.6–33.0)
MCHC: 33.7 g/dL (ref 31.5–35.7)
MCV: 95 fL (ref 79–97)
Monocytes Absolute: 0.5 x10E3/uL (ref 0.1–0.9)
Monocytes: 8 %
Neutrophils Absolute: 3.9 x10E3/uL (ref 1.4–7.0)
Neutrophils: 67 %
Platelets: 168 x10E3/uL (ref 150–450)
RBC: 4.28 x10E6/uL (ref 3.77–5.28)
RDW: 13.1 % (ref 11.7–15.4)
WBC: 5.7 x10E3/uL (ref 3.4–10.8)

## 2024-04-12 LAB — COMPREHENSIVE METABOLIC PANEL WITH GFR
ALT: 23 IU/L (ref 0–32)
AST: 19 IU/L (ref 0–40)
Albumin: 4.3 g/dL (ref 3.7–4.7)
Alkaline Phosphatase: 79 IU/L (ref 48–129)
BUN/Creatinine Ratio: 22 (ref 12–28)
BUN: 17 mg/dL (ref 8–27)
Bilirubin Total: 0.4 mg/dL (ref 0.0–1.2)
CO2: 24 mmol/L (ref 20–29)
Calcium: 10.1 mg/dL (ref 8.7–10.3)
Chloride: 106 mmol/L (ref 96–106)
Creatinine, Ser: 0.79 mg/dL (ref 0.57–1.00)
Globulin, Total: 2.5 g/dL (ref 1.5–4.5)
Glucose: 98 mg/dL (ref 70–99)
Potassium: 4.7 mmol/L (ref 3.5–5.2)
Sodium: 145 mmol/L — ABNORMAL HIGH (ref 134–144)
Total Protein: 6.8 g/dL (ref 6.0–8.5)
eGFR: 75 mL/min/1.73 (ref >59)

## 2024-04-12 LAB — TSH: TSH: 0.49 u[IU]/mL (ref 0.450–4.500)

## 2024-04-12 LAB — B12 AND FOLATE PANEL
Folate: 7.3 ng/mL (ref >3.0)
Vitamin B-12: 1603 pg/mL — ABNORMAL HIGH (ref 232–1245)

## 2024-04-12 NOTE — Addendum Note (Signed)
 Addended byBETHA CALEEN DIRKS on: 04/12/2024 10:49 AM   Modules accepted: Orders

## 2024-04-12 NOTE — Assessment & Plan Note (Signed)
 I will refer her to see nuerologist in [place]

## 2024-04-13 ENCOUNTER — Other Ambulatory Visit: Payer: Self-pay | Admitting: Internal Medicine

## 2024-04-13 ENCOUNTER — Ambulatory Visit: Admitting: Internal Medicine

## 2024-04-13 ENCOUNTER — Other Ambulatory Visit: Payer: Self-pay

## 2024-04-13 ENCOUNTER — Ambulatory Visit: Payer: Self-pay

## 2024-04-13 DIAGNOSIS — R319 Hematuria, unspecified: Secondary | ICD-10-CM

## 2024-04-13 DIAGNOSIS — N390 Urinary tract infection, site not specified: Secondary | ICD-10-CM

## 2024-04-13 DIAGNOSIS — G309 Alzheimer's disease, unspecified: Secondary | ICD-10-CM

## 2024-04-13 LAB — POCT URINALYSIS DIPSTICK
Glucose, UA: NEGATIVE
Nitrite, UA: NEGATIVE
Protein, UA: POSITIVE — AB
Spec Grav, UA: >=1.03 — AB (ref 1.010–1.025)
Urobilinogen, UA: NEGATIVE U/dL — AB
pH, UA: 5 (ref 5.0–8.0)

## 2024-04-13 LAB — MICROALBUMIN / CREATININE URINE RATIO
Creatinine, Urine: 111.6 mg/dL
Microalb/Creat Ratio: 5 mg/g{creat} (ref 0–29)
Microalbumin, Urine: 6.1 ug/mL

## 2024-04-13 LAB — SPECIMEN STATUS REPORT

## 2024-04-13 MED ORDER — NITROFURANTOIN MONOHYD MACRO 100 MG PO CAPS: 100.0000 mg | ORAL_CAPSULE | Freq: Two times a day (BID) | ORAL | 0 refills | Status: DC

## 2024-04-13 NOTE — Progress Notes (Signed)
 Patient called.  Patient aware. Her labs are good . Spoke to Mr. Kromer (Husband).

## 2024-04-13 NOTE — Progress Notes (Signed)
   Established Patient Office Visit  Subjective   Patient ID: Shirley Woods, female    DOB: [birth year]  Age: 83 y.o. MRN: [id number]  Chief Complaint  Patient presents with   nurse visit    UTI symptoms     HPI    ROS    Objective:     There were no vitals taken for this visit.   Physical Exam   Results for orders placed or performed in visit on 04/13/24  POCT urinalysis dipstick  Result Value Ref Range   Color, UA yellow    Clarity, UA clear    Glucose, UA Negative Negative   Bilirubin, UA large    Ketones, UA moderate    Spec Grav, UA >=1.030 (A) 1.010 - 1.025   Blood, UA trace    pH, UA 5.0 5.0 - 8.0   Protein, UA Positive (A) Negative   Urobilinogen, UA negative (A) 0.2 or 1.0 E.U./dL   Nitrite, UA neg    Leukocytes, UA Trace (A) Negative   Appearance clear    Odor yes       The ASCVD Risk score (Arnett DK, et al., 2019) failed to calculate for the following reasons:   The 2019 ASCVD risk score is only valid for ages 75 to 39    Assessment & Plan:   Problem List Items Addressed This Visit   None Visit Diagnoses       Urinary tract infection with hematuria, site unspecified    -  Primary   Relevant Orders   POCT urinalysis dipstick (Completed)   Urine Culture       No follow-ups on file.    Matei Magnone

## 2024-04-14 ENCOUNTER — Other Ambulatory Visit: Payer: Self-pay

## 2024-04-14 ENCOUNTER — Emergency Department (HOSPITAL_BASED_OUTPATIENT_CLINIC_OR_DEPARTMENT_OTHER)

## 2024-04-14 ENCOUNTER — Emergency Department (HOSPITAL_BASED_OUTPATIENT_CLINIC_OR_DEPARTMENT_OTHER)
Admission: EM | Admit: 2024-04-14 | Discharge: 2024-04-14 | Disposition: A | Discharge status: Home / Self Care | Attending: Emergency Medicine | Admitting: Emergency Medicine

## 2024-04-14 ENCOUNTER — Encounter: Payer: Self-pay | Admitting: Physician Assistant

## 2024-04-14 DIAGNOSIS — R410 Disorientation, unspecified: Secondary | ICD-10-CM | POA: Insufficient documentation

## 2024-04-14 DIAGNOSIS — I10 Essential (primary) hypertension: Secondary | ICD-10-CM | POA: Diagnosis not present

## 2024-04-14 DIAGNOSIS — R4182 Altered mental status, unspecified: Secondary | ICD-10-CM | POA: Diagnosis not present

## 2024-04-14 DIAGNOSIS — I672 Cerebral atherosclerosis: Secondary | ICD-10-CM | POA: Diagnosis not present

## 2024-04-14 LAB — CBC WITH DIFFERENTIAL/PLATELET
Abs Immature Granulocytes: 0.01 K/uL (ref 0.00–0.07)
Basophils Absolute: 0 K/uL (ref 0.0–0.1)
Basophils Relative: 0 %
Eosinophils Absolute: 0 K/uL (ref 0.0–0.5)
Eosinophils Relative: 1 %
HCT: 41.8 % (ref 36.0–46.0)
Hemoglobin: 14.3 g/dL (ref 12.0–15.0)
Immature Granulocytes: 0 %
Lymphocytes Relative: 20 %
Lymphs Abs: 1.2 K/uL (ref 0.7–4.0)
MCH: 31.5 pg (ref 26.0–34.0)
MCHC: 34.2 g/dL (ref 30.0–36.0)
MCV: 92.1 fL (ref 80.0–100.0)
Monocytes Absolute: 0.5 K/uL (ref 0.1–1.0)
Monocytes Relative: 9 %
Neutro Abs: 4.2 K/uL (ref 1.7–7.7)
Neutrophils Relative %: 70 %
Platelets: 178 K/uL (ref 150–400)
RBC: 4.54 MIL/uL (ref 3.87–5.11)
RDW: 13.2 % (ref 11.5–15.5)
WBC: 6.1 K/uL (ref 4.0–10.5)
nRBC: 0 % (ref 0.0–0.2)

## 2024-04-14 LAB — BASIC METABOLIC PANEL WITH GFR
Anion gap: 12 (ref 5–15)
BUN: 14 mg/dL (ref 8–23)
CO2: 25 mmol/L (ref 22–32)
Calcium: 10.9 mg/dL — ABNORMAL HIGH (ref 8.9–10.3)
Chloride: 104 mmol/L (ref 98–111)
Creatinine, Ser: 0.81 mg/dL (ref 0.44–1.00)
GFR, Estimated: >60 mL/min (ref >60)
Glucose, Bld: 109 mg/dL — ABNORMAL HIGH (ref 70–99)
Potassium: 4 mmol/L (ref 3.5–5.1)
Sodium: 141 mmol/L (ref 135–145)

## 2024-04-14 LAB — URINALYSIS, W/ REFLEX TO CULTURE (INFECTION SUSPECTED)
Bacteria, UA: NONE SEEN
Bilirubin Urine: NEGATIVE
Glucose, UA: NEGATIVE mg/dL
Hgb urine dipstick: NEGATIVE
Ketones, ur: NEGATIVE mg/dL
Leukocytes,Ua: NEGATIVE
Nitrite: NEGATIVE
Protein, ur: NEGATIVE mg/dL
Specific Gravity, Urine: 1.01 (ref 1.005–1.030)
pH: 6 (ref 5.0–8.0)

## 2024-04-14 NOTE — ED Provider Notes (Signed)
 [place] EMERGENCY DEPARTMENT AT [facility] Provider Note   CSN: [id number] Arrival date & time: 04/14/24  1535     Patient presents with: Altered Mental Status   Shirley Woods is a 83 y.o. female.  She is brought in by her husband who is helping with the history.  He said around 1 PM he was talking to her on the phone and she got confused, could not remember who he was.  Thought he was her father.  Symptoms lasted about 30 minutes.  This has happened multiple times in the past most recently within the last month.  He said she has a remote history of a subarachnoid hemorrhage and probably has some dementia.  He has made an appointment for neurology for her in February.  She has also had some urinary symptoms and started an antibiotic yesterday.  She denies any specific complaints.  {Add pertinent medical, surgical, social history, OB history to YEP:67052} The history is provided by the patient and the spouse.  Altered Mental Status Presenting symptoms: confusion and disorientation   Most recent episode:  Today Episode history:  Single Duration:  30 minutes Progression:  Resolved Chronicity:  Recurrent Associated symptoms: no abdominal pain, no difficulty breathing, no fever and no vomiting        Prior to Admission medications   Medication Sig Start Date End Date Taking? Authorizing Provider  acetaminophen  (TYLENOL ) 500 MG tablet Take 1 tablet (500 mg total) by mouth every 6 (six) hours as needed. 05/06/23   Charlyn Sora, MD  amLODipine  (NORVASC ) 5 MG tablet TAKE ONE TABLET BY MOUTH ONCE A DAY 03/31/24   Caleen Dirks, MD  Carboxymethylcellulose Sodium 1 % GEL Place 1 application into both eyes daily as needed (dry eyes).    [provider]  Cholecalciferol (VITAMIN D) 50 MCG (2000 UT) tablet Take 2,000 Units by mouth daily.    [provider]  Cyanocobalamin  (B-12 PO) Take 1 capsule by mouth every other day.    [provider]  donepezil   (ARICEPT ) 5 MG tablet Take 1 tablet (5 mg total) by mouth at bedtime. 04/06/24   Amin, Saad, MD  lamoTRIgine  (LAMICTAL ) 25 MG tablet Take 1 tablet (25 mg total) by mouth daily. 03/17/24   Amin, Saad, MD  lidocaine  (LIDODERM ) 5 % Place 1 patch onto the skin daily. Remove & Discard patch within 12 hours or as directed by MD 05/20/23   Caleen Dirks, MD  lidocaine  (LIDODERM ) 5 % Place 1 patch onto the skin every 12 (twelve) hours. Remove & Discard patch within 12 hours or as directed by MD 05/27/23 05/26/24  Amin, Saad, MD  LORazepam  (ATIVAN ) 1 MG tablet Take 1.5 mg ( 1 1/2 tablet) PO at night 01/23/24   Amin, Saad, MD  Multiple Vitamin (MULTIVITAMIN WITH MINERALS) TABS tablet Take 1 tablet by mouth daily.    [provider]  nitrofurantoin, macrocrystal-monohydrate, (MACROBID) 100 MG capsule Take 1 capsule (100 mg total) by mouth 2 (two) times daily. 04/13/24   Amin, Saad, MD  OVER THE COUNTER MEDICATION Take 3 tablets by mouth 2 (two) times daily. algea cal    [provider]  OVER THE COUNTER MEDICATION Take 2 tablets by mouth every evening. strontium boost (at least 2 hours after the 2nd dose of algea cal)    [provider]  OVER THE COUNTER MEDICATION Place 1 patch onto the skin at bedtime. Zleep otc sleep patches    [provider]  Plant Sterols  and Stanols (CHOLEST OFF PO) Take 2 tablets by mouth 2 (two) times daily.    [provider]  QUEtiapine  (SEROQUEL ) 50 MG tablet Take 1 tablet (50 mg total) by mouth 2 (two) times daily. 10/20/23   Amin, Saad, MD  sertraline  (ZOLOFT ) 100 MG tablet TAKE ONE AND ONE-HALF TABLETS (150MG ) BY MOUTH ONCE DAILY 03/22/24   Amin, Saad, MD  zinc gluconate 50 MG tablet Take 50 mg by mouth daily.    [provider]    Allergies: Patient has no known allergies.    Review of Systems  Constitutional:  Negative for fever.  HENT:  Negative for sore throat.   Respiratory:  Negative for shortness of breath.   Cardiovascular:   Negative for chest pain.  Gastrointestinal:  Negative for abdominal pain and vomiting.  Genitourinary:  Negative for dysuria.  Psychiatric/Behavioral:  Positive for confusion.     Updated Vital Signs BP 134/82 (BP Location: Right Arm)   Pulse 78   Temp 98.9 F (37.2 C) (Oral)   Resp 18   SpO2 99%   Physical Exam Vitals and nursing note reviewed.  Constitutional:      General: She is not in acute distress.    Appearance: Normal appearance. She is well-developed.  HENT:     Head: Normocephalic and atraumatic.  Eyes:     Conjunctiva/sclera: Conjunctivae normal.  Cardiovascular:     Rate and Rhythm: Normal rate and regular rhythm.     Heart sounds: No murmur heard. Pulmonary:     Effort: Pulmonary effort is normal. No respiratory distress.     Breath sounds: Normal breath sounds.  Abdominal:     Palpations: Abdomen is soft.     Tenderness: There is no abdominal tenderness.  Musculoskeletal:        General: No swelling.     Cervical back: Neck supple.  Skin:    General: Skin is warm and dry.     Capillary Refill: Capillary refill takes less than 2 seconds.  Neurological:     General: No focal deficit present.     Mental Status: She is alert. She is disoriented.     Cranial Nerves: No cranial nerve deficit.     Sensory: No sensory deficit.     Motor: No weakness.     Comments: She is awake and alert.  She thinks it is Monday in September.  She does not know who the president is but she can name objects.     (all labs ordered are listed, but only abnormal results are displayed) Labs Reviewed - No data to display  EKG: None  Radiology: No results found.  {Document cardiac monitor, telemetry assessment procedure when appropriate:32947} Procedures   Medications Ordered in the ED - No data to display    {Click here for ABCD2, HEART and other calculators REFRESH Note before signing:1}                              Medical Decision Making Amount and/or Complexity  of Data Reviewed Labs: ordered. Radiology: ordered.   This patient complains of ***; this involves an extensive number of treatment Options and is a complaint that carries with it a high risk of complications and morbidity. The differential includes ***  I ordered, reviewed and interpreted labs, which included *** I ordered medication *** and reviewed PMP when indicated. I ordered imaging studies which included *** and I independently  visualized and interpreted imaging which showed *** Additional history obtained from *** Previous records obtained and reviewed *** I consulted *** and discussed lab and imaging findings and discussed disposition.  Cardiac monitoring reviewed, *** Social determinants considered, *** Critical Interventions: ***  After the interventions stated above, I reevaluated the patient and found *** Admission and further testing considered, ***   {Document critical care time when appropriate  Document review of labs and clinical decision tools ie CHADS2VASC2, etc  Document your independent review of radiology images and any outside records  Document your discussion with family members, caretakers and with consultants  Document social determinants of health affecting pt's care  Document your decision making why or why not admission, treatments were needed:32947:::1}   Final diagnoses:  None    ED Discharge Orders     None

## 2024-04-14 NOTE — Discharge Instructions (Addendum)
 You are seen in the emergency department for an episode of confusion.  You had a CAT scan of your head that did not show any obvious signs of stroke and lab work and urinalysis that were unremarkable.  I am putting a referral for you to follow-up with neurology.  Please also follow-up with your primary care doctor.  Return if any worsening or concerning symptoms

## 2024-04-14 NOTE — ED Triage Notes (Signed)
 Husband reports patient became confused approx 1 hour ago. States symptoms has resolved. Patient is at baseline per husband. Recent UTI dx.

## 2024-04-16 LAB — URINE CULTURE

## 2024-04-17 ENCOUNTER — Emergency Department (HOSPITAL_BASED_OUTPATIENT_CLINIC_OR_DEPARTMENT_OTHER)
Admission: EM | Admit: 2024-04-17 | Discharge: 2024-04-17 | Disposition: A | Discharge status: Home / Self Care | Attending: Emergency Medicine | Admitting: Emergency Medicine

## 2024-04-17 ENCOUNTER — Emergency Department (HOSPITAL_BASED_OUTPATIENT_CLINIC_OR_DEPARTMENT_OTHER)

## 2024-04-17 ENCOUNTER — Other Ambulatory Visit: Payer: Self-pay

## 2024-04-17 ENCOUNTER — Encounter (HOSPITAL_BASED_OUTPATIENT_CLINIC_OR_DEPARTMENT_OTHER): Payer: Self-pay

## 2024-04-17 DIAGNOSIS — Z8659 Personal history of other mental and behavioral disorders: Secondary | ICD-10-CM | POA: Diagnosis not present

## 2024-04-17 DIAGNOSIS — R4182 Altered mental status, unspecified: Secondary | ICD-10-CM | POA: Insufficient documentation

## 2024-04-17 DIAGNOSIS — I10 Essential (primary) hypertension: Secondary | ICD-10-CM | POA: Diagnosis not present

## 2024-04-17 LAB — COMPREHENSIVE METABOLIC PANEL WITH GFR
ALT: 18 U/L (ref 0–44)
AST: 20 U/L (ref 15–41)
Albumin: 4.2 g/dL (ref 3.5–5.0)
Alkaline Phosphatase: 79 U/L (ref 38–126)
Anion gap: 9 (ref 5–15)
BUN: 15 mg/dL (ref 8–23)
CO2: 27 mmol/L (ref 22–32)
Calcium: 10.4 mg/dL — ABNORMAL HIGH (ref 8.9–10.3)
Chloride: 107 mmol/L (ref 98–111)
Creatinine, Ser: 0.91 mg/dL (ref 0.44–1.00)
GFR, Estimated: >60 mL/min (ref >60)
Glucose, Bld: 102 mg/dL — ABNORMAL HIGH (ref 70–99)
Potassium: 4.9 mmol/L (ref 3.5–5.1)
Sodium: 144 mmol/L (ref 135–145)
Total Bilirubin: 0.4 mg/dL (ref 0.0–1.2)
Total Protein: 6.9 g/dL (ref 6.5–8.1)

## 2024-04-17 LAB — URINALYSIS, ROUTINE W REFLEX MICROSCOPIC
Bilirubin Urine: NEGATIVE
Glucose, UA: NEGATIVE mg/dL
Hgb urine dipstick: NEGATIVE
Ketones, ur: NEGATIVE mg/dL
Leukocytes,Ua: NEGATIVE
Nitrite: NEGATIVE
Protein, ur: NEGATIVE mg/dL
Specific Gravity, Urine: 1.007 (ref 1.005–1.030)
pH: 6 (ref 5.0–8.0)

## 2024-04-17 LAB — CBC WITH DIFFERENTIAL/PLATELET
Abs Immature Granulocytes: 0.01 K/uL (ref 0.00–0.07)
Basophils Absolute: 0 K/uL (ref 0.0–0.1)
Basophils Relative: 0 %
Eosinophils Absolute: 0.1 K/uL (ref 0.0–0.5)
Eosinophils Relative: 1 %
HCT: 39.5 % (ref 36.0–46.0)
Hemoglobin: 13.5 g/dL (ref 12.0–15.0)
Immature Granulocytes: 0 %
Lymphocytes Relative: 18 %
Lymphs Abs: 1.3 K/uL (ref 0.7–4.0)
MCH: 31.3 pg (ref 26.0–34.0)
MCHC: 34.2 g/dL (ref 30.0–36.0)
MCV: 91.4 fL (ref 80.0–100.0)
Monocytes Absolute: 0.7 K/uL (ref 0.1–1.0)
Monocytes Relative: 10 %
Neutro Abs: 4.7 K/uL (ref 1.7–7.7)
Neutrophils Relative %: 71 %
Platelets: 176 K/uL (ref 150–400)
RBC: 4.32 MIL/uL (ref 3.87–5.11)
RDW: 13 % (ref 11.5–15.5)
WBC: 6.8 K/uL (ref 4.0–10.5)
nRBC: 0 % (ref 0.0–0.2)

## 2024-04-17 NOTE — ED Notes (Signed)
 Reviewed AVS/discharge instruction with patient. Time allotted for and all questions answered. Patient is agreeable for d/c and escorted to ed exit by staff.

## 2024-04-17 NOTE — ED Notes (Signed)
 X-ray at bedside.

## 2024-04-17 NOTE — Discharge Instructions (Signed)
 Continue to work with your primary care doctor to ensure the safest environment for the patient as we discussed.

## 2024-04-17 NOTE — ED Notes (Signed)
 Patient transported to MRI

## 2024-04-17 NOTE — ED Notes (Signed)
 Patient d/c in care of husband

## 2024-04-17 NOTE — ED Provider Notes (Signed)
  EMERGENCY DEPARTMENT AT [facility] Provider Note   CSN: [id number] Arrival date & time: 04/17/24  1439     Patient presents with: Altered Mental Status   Shirley Woods is a 83 y.o. female.   Patient here with some confusion today.  History of dementia.  She has had some increased confusion episodes recently.  [place] with [place] husband.  Husband is in working with facility to try to get more support at home given that she has been having some increased confusion events.  She does finish of antibiotics for UTI.  Was seen here several days ago for the same had unremarkable workup.  She had an episode today where she got out of her apartment and wandered and was [place].  She can tell me her name where she is who she is with.  Family does suspect that there could be progression of dementia.  She supposed to get an MRI next month to further evaluate things but she is on donepezil .  She has no complaints.  No chest pain or shortness of breath no weakness numbness tingling.  Sounds like patient's husband has been working with facility to try to get more support at home he feels comfortable figuring out these logistics.  He does want to make sure nothing else was going on today.  The history is provided by the patient and a caregiver.       Prior to Admission medications   Medication Sig Start Date End Date Taking? Authorizing Provider  acetaminophen  (TYLENOL ) 500 MG tablet Take 1 tablet (500 mg total) by mouth every 6 (six) hours as needed. 05/06/23   Charlyn Sora, MD  amLODipine  (NORVASC ) 5 MG tablet TAKE ONE TABLET BY MOUTH ONCE A DAY 03/31/24   Caleen Dirks, MD  Carboxymethylcellulose Sodium 1 % GEL Place 1 application into both eyes daily as needed (dry eyes).    [provider]  Cholecalciferol (VITAMIN D) 50 MCG (2000 UT) tablet Take 2,000 Units by mouth daily.    [provider]  Cyanocobalamin   (B-12 PO) Take 1 capsule by mouth every other day.    [provider]  donepezil  (ARICEPT ) 5 MG tablet Take 1 tablet (5 mg total) by mouth at bedtime. 04/06/24   Amin, Saad, MD  lamoTRIgine  (LAMICTAL ) 25 MG tablet Take 1 tablet (25 mg total) by mouth daily. 03/17/24   Amin, Saad, MD  lidocaine  (LIDODERM ) 5 % Place 1 patch onto the skin daily. Remove & Discard patch within 12 hours or as directed by MD 05/20/23   Caleen Dirks, MD  lidocaine  (LIDODERM ) 5 % Place 1 patch onto the skin every 12 (twelve) hours. Remove & Discard patch within 12 hours or as directed by MD 05/27/23 05/26/24  Amin, Saad, MD  LORazepam  (ATIVAN ) 1 MG tablet Take 1.5 mg ( 1 1/2 tablet) PO at night 01/23/24   Amin, Saad, MD  Multiple Vitamin (MULTIVITAMIN WITH MINERALS) TABS tablet Take 1 tablet by mouth daily.    [provider]  nitrofurantoin , macrocrystal-monohydrate, (MACROBID ) 100 MG capsule Take 1 capsule (100 mg total) by mouth 2 (two) times daily. 04/13/24   Amin, Saad, MD  OVER THE COUNTER MEDICATION Take 3 tablets by mouth 2 (two) times daily. algea cal    [provider]  OVER THE COUNTER MEDICATION Take 2 tablets by mouth every evening. strontium boost (at least 2 hours after the 2nd dose of algea cal)  [provider]  OVER THE COUNTER MEDICATION Place 1 patch onto the skin at bedtime. Zleep otc sleep patches    [provider]  Plant Sterols and Stanols (CHOLEST OFF PO) Take 2 tablets by mouth 2 (two) times daily.    [provider]  QUEtiapine  (SEROQUEL ) 50 MG tablet Take 1 tablet (50 mg total) by mouth 2 (two) times daily. 10/20/23   Amin, Saad, MD  sertraline  (ZOLOFT ) 100 MG tablet TAKE ONE AND ONE-HALF TABLETS (150MG ) BY MOUTH ONCE DAILY 03/22/24   Amin, Saad, MD  zinc gluconate 50 MG tablet Take 50 mg by mouth daily.    [provider]    Allergies: Patient has no known allergies.    Review of Systems  Updated Vital Signs BP (!) 157/94   Pulse 71    Temp 98.2 F (36.8 C) (Oral)   Resp 19   Ht 5' 3 (1.6 m)   Wt 58.3 kg   SpO2 96%   BMI 22.77 kg/m   Physical Exam Vitals and nursing note reviewed.  Constitutional:      General: She is not in acute distress.    Appearance: She is well-developed. She is not ill-appearing.  HENT:     Head: Normocephalic and atraumatic.     Nose: Nose normal.     Mouth/Throat:     Mouth: Mucous membranes are moist.  Eyes:     Extraocular Movements: Extraocular movements intact.     Conjunctiva/sclera: Conjunctivae normal.     Pupils: Pupils are equal, round, and reactive to light.  Cardiovascular:     Rate and Rhythm: Normal rate and regular rhythm.     Pulses: Normal pulses.     Heart sounds: Normal heart sounds. No murmur heard. Pulmonary:     Effort: Pulmonary effort is normal. No respiratory distress.     Breath sounds: Normal breath sounds.  Abdominal:     General: Abdomen is flat.     Palpations: Abdomen is soft.     Tenderness: There is no abdominal tenderness.  Musculoskeletal:        General: No swelling.     Cervical back: Normal range of motion and neck supple.  Skin:    General: Skin is warm and dry.     Capillary Refill: Capillary refill takes less than 2 seconds.  Neurological:     General: No focal deficit present.     Mental Status: She is alert and oriented to person, place, and time.     Cranial Nerves: No cranial nerve deficit.     Sensory: No sensory deficit.     Motor: No weakness.     Coordination: Coordination normal.     Comments: Patient overall appears well she can tell me her name where she is follows commands strength and sensation appear to be intact speech is intact  Psychiatric:        Mood and Affect: Mood normal.     (all labs ordered are listed, but only abnormal results are displayed) Labs Reviewed  COMPREHENSIVE METABOLIC PANEL WITH GFR - Abnormal; Notable for the following components:      Result Value   Glucose, Bld 102 (*)    Calcium 10.4  (*)    All other components within normal limits  URINALYSIS, ROUTINE W REFLEX MICROSCOPIC - Abnormal; Notable for the following components:   Color, Urine COLORLESS (*)    All other components within normal limits  CBC WITH DIFFERENTIAL/PLATELET    EKG: EKG  Interpretation Date/Time:  Saturday April 17 2024 15:13:47 EST Ventricular Rate:  67 PR Interval:  161 QRS Duration:  86 QT Interval:  409 QTC Calculation: 432 R Axis:   19  Text Interpretation: Sinus rhythm Left ventricular hypertrophy Confirmed by Ruthe Cornet [phone]) on 04/17/2024 3:15:15 PM  Radiology: MR BRAIN WO CONTRAST Result Date: 04/17/2024 EXAM: MRI Brain Without Contrast 04/17/2024 04:38:43 PM TECHNIQUE: Multiplanar multisequence MRI of the head/brain was performed without the administration of intravenous contrast. COMPARISON: MRI head 06/23/2022 CLINICAL HISTORY: Mental status change, unknown cause; Neuro deficit, acute, stroke suspected FINDINGS: BRAIN AND VENTRICLES: No acute infarct. No intracranial hemorrhage. No mass. No midline shift. No hydrocephalus. Similar scattered cortical and subcortical chronic microhemorrhages . Similar chronic hippocampal atrophy. Normal flow voids. ORBITS: No acute abnormality. SINUSES AND MASTOIDS: No acute abnormality. BONES AND SOFT TISSUES: Normal marrow signal. No acute soft tissue abnormality. IMPRESSION: 1. No acute intracranial abnormality. Electronically signed by: Gilmore Molt MD 04/17/2024 04:53 PM EST RP Workstation: [id number]   DG Chest Portable 1 View Result Date: 04/17/2024 EXAM: 1 VIEW(S) XRAY OF THE CHEST 04/17/2024 03:25:10 PM COMPARISON: 05/06/2023 status post right shoulder arthroplasty. CLINICAL HISTORY: ams ams FINDINGS: LUNGS AND PLEURA: No focal pulmonary opacity. No pleural effusion. No pneumothorax. HEART AND MEDIASTINUM: No acute abnormality of the cardiac and mediastinal silhouettes. BONES AND SOFT TISSUES: No acute osseous abnormality. IMPRESSION: 1. No  acute cardiopulmonary process. Electronically signed by: Lynwood Seip MD 04/17/2024 03:41 PM EST RP Workstation: [id number]     Procedures   Medications Ordered in the ED - No data to display                                  Medical Decision Making Amount and/or Complexity of Data Reviewed Labs: ordered. Radiology: ordered.   Shirley Woods is here with some altered mental status.  History of dementia.  Overall she appears very well.  Neurologically intact.  Will check basic labs urinalysis chest x-ray MRI brain.  She has had a head CT a couple days ago.  She wandered out of her apartment where she lives with her husband.  They live in [place] retirement community.  Husband noticed increased confusion events.  Seems like dementia is progressing.  He is working with facility to get more support may be more assisted living process/8.  She is very well-appearing.  EKG shows sinus rhythm.  No ischemic changes.  Will get basic labs brain MRI chest x-ray and urinalysis and reevaluate.  I do think that these are likely event secondary to dementia and no other process but will try to rule out other organic etiologies.  Sounds like patient does live in a safe environment.  Husband feels comfortable with where they are living.  Everyone is aware of her memory issues where they live and they are working on getting more support.  Overall lab work is unremarkable.  Urinalysis negative for infection.  Chest x-ray negative for infection.  Brain MRI is normal.  Overall family feels comfortable taking her home.  They are working on getting some more support and feel comfortable with taking her home.  [place].  There is security there.  There is a lot of people that can watch her in case she does have episode of wandering off again but I had long discussion with her husband about really trying to consider transitioning her to more of  a skilled nursing facility/24/7 care facility especially if  these events keep recurring will continue to work with his primary care doctor about this.  Discharge.  This chart was dictated using voice recognition software.  Despite best efforts to proofread,  errors can occur which can change the documentation meaning.      Final diagnoses:  Altered mental status, unspecified altered mental status type    ED Discharge Orders     None          Ruthe Cornet, DO 04/17/24 1708

## 2024-04-17 NOTE — ED Triage Notes (Signed)
 Arrives POV accompanied to the ED with her spouse. Patient has dementia at baseline and she has become increasingly confused and wandered out of her home today ([place] community) and her husband had to go find her. Seen here recently for the same and currently being treated for a UTI.

## 2024-04-22 ENCOUNTER — Emergency Department (HOSPITAL_COMMUNITY)

## 2024-04-22 ENCOUNTER — Inpatient Hospital Stay (HOSPITAL_COMMUNITY)

## 2024-04-22 ENCOUNTER — Inpatient Hospital Stay (HOSPITAL_COMMUNITY)
Admission: EM | Admit: 2024-04-22 | Discharge: 2024-05-18 | DRG: 503 | Disposition: A | Discharge status: Skilled Nursing Facility | Source: Skilled Nursing Facility | Attending: General Surgery | Admitting: General Surgery

## 2024-04-22 DIAGNOSIS — Z23 Encounter for immunization: Secondary | ICD-10-CM | POA: Diagnosis present

## 2024-04-22 DIAGNOSIS — F05 Delirium due to known physiological condition: Secondary | ICD-10-CM | POA: Diagnosis present

## 2024-04-22 DIAGNOSIS — S82831B Other fracture of upper and lower end of right fibula, initial encounter for open fracture type I or II: Secondary | ICD-10-CM

## 2024-04-22 DIAGNOSIS — R40241 Glasgow coma scale score 13-15, unspecified time: Secondary | ICD-10-CM | POA: Diagnosis present

## 2024-04-22 DIAGNOSIS — T07XXXA Unspecified multiple injuries, initial encounter: Secondary | ICD-10-CM | POA: Diagnosis not present

## 2024-04-22 DIAGNOSIS — I10 Essential (primary) hypertension: Secondary | ICD-10-CM | POA: Diagnosis present

## 2024-04-22 DIAGNOSIS — S92002D Unspecified fracture of left calcaneus, subsequent encounter for fracture with routine healing: Secondary | ICD-10-CM | POA: Diagnosis not present

## 2024-04-22 DIAGNOSIS — S92022A Displaced fracture of anterior process of left calcaneus, initial encounter for closed fracture: Secondary | ICD-10-CM | POA: Diagnosis not present

## 2024-04-22 DIAGNOSIS — S8262XA Displaced fracture of lateral malleolus of left fibula, initial encounter for closed fracture: Secondary | ICD-10-CM | POA: Diagnosis not present

## 2024-04-22 DIAGNOSIS — Z90710 Acquired absence of both cervix and uterus: Secondary | ICD-10-CM

## 2024-04-22 DIAGNOSIS — Z781 Physical restraint status: Secondary | ICD-10-CM | POA: Diagnosis not present

## 2024-04-22 DIAGNOSIS — F0394 Unspecified dementia, unspecified severity, with anxiety: Secondary | ICD-10-CM | POA: Diagnosis present

## 2024-04-22 DIAGNOSIS — S92322A Displaced fracture of second metatarsal bone, left foot, initial encounter for closed fracture: Secondary | ICD-10-CM | POA: Diagnosis not present

## 2024-04-22 DIAGNOSIS — F03911 Unspecified dementia, unspecified severity, with agitation: Secondary | ICD-10-CM | POA: Diagnosis present

## 2024-04-22 DIAGNOSIS — S8012XA Contusion of left lower leg, initial encounter: Secondary | ICD-10-CM | POA: Diagnosis present

## 2024-04-22 DIAGNOSIS — W134XXA Fall from, out of or through window, initial encounter: Secondary | ICD-10-CM | POA: Diagnosis present

## 2024-04-22 DIAGNOSIS — S82202A Unspecified fracture of shaft of left tibia, initial encounter for closed fracture: Secondary | ICD-10-CM

## 2024-04-22 DIAGNOSIS — S92252A Displaced fracture of navicular [scaphoid] of left foot, initial encounter for closed fracture: Secondary | ICD-10-CM | POA: Diagnosis present

## 2024-04-22 DIAGNOSIS — D696 Thrombocytopenia, unspecified: Secondary | ICD-10-CM | POA: Diagnosis not present

## 2024-04-22 DIAGNOSIS — S8261XA Displaced fracture of lateral malleolus of right fibula, initial encounter for closed fracture: Secondary | ICD-10-CM | POA: Diagnosis not present

## 2024-04-22 DIAGNOSIS — S92211A Displaced fracture of cuboid bone of right foot, initial encounter for closed fracture: Secondary | ICD-10-CM | POA: Diagnosis present

## 2024-04-22 DIAGNOSIS — R339 Retention of urine, unspecified: Secondary | ICD-10-CM | POA: Diagnosis not present

## 2024-04-22 DIAGNOSIS — Z86711 Personal history of pulmonary embolism: Secondary | ICD-10-CM

## 2024-04-22 DIAGNOSIS — S92252D Displaced fracture of navicular [scaphoid] of left foot, subsequent encounter for fracture with routine healing: Secondary | ICD-10-CM | POA: Diagnosis not present

## 2024-04-22 DIAGNOSIS — S9304XA Dislocation of right ankle joint, initial encounter: Secondary | ICD-10-CM | POA: Diagnosis present

## 2024-04-22 DIAGNOSIS — S82831A Other fracture of upper and lower end of right fibula, initial encounter for closed fracture: Secondary | ICD-10-CM | POA: Diagnosis not present

## 2024-04-22 DIAGNOSIS — S8261XB Displaced fracture of lateral malleolus of right fibula, initial encounter for open fracture type I or II: Secondary | ICD-10-CM | POA: Diagnosis not present

## 2024-04-22 DIAGNOSIS — S82871A Displaced pilon fracture of right tibia, initial encounter for closed fracture: Secondary | ICD-10-CM | POA: Diagnosis not present

## 2024-04-22 DIAGNOSIS — S82899B Other fracture of unspecified lower leg, initial encounter for open fracture type I or II: Secondary | ICD-10-CM | POA: Diagnosis present

## 2024-04-22 DIAGNOSIS — F0392 Unspecified dementia, unspecified severity, with psychotic disturbance: Secondary | ICD-10-CM | POA: Diagnosis present

## 2024-04-22 DIAGNOSIS — S299XXA Unspecified injury of thorax, initial encounter: Secondary | ICD-10-CM | POA: Diagnosis not present

## 2024-04-22 DIAGNOSIS — S92332A Displaced fracture of third metatarsal bone, left foot, initial encounter for closed fracture: Secondary | ICD-10-CM | POA: Diagnosis present

## 2024-04-22 DIAGNOSIS — S92331A Displaced fracture of third metatarsal bone, right foot, initial encounter for closed fracture: Secondary | ICD-10-CM | POA: Diagnosis present

## 2024-04-22 DIAGNOSIS — Y92129 Unspecified place in nursing home as the place of occurrence of the external cause: Secondary | ICD-10-CM | POA: Diagnosis not present

## 2024-04-22 DIAGNOSIS — D62 Acute posthemorrhagic anemia: Secondary | ICD-10-CM | POA: Diagnosis not present

## 2024-04-22 DIAGNOSIS — Z79899 Other long term (current) drug therapy: Secondary | ICD-10-CM | POA: Diagnosis not present

## 2024-04-22 DIAGNOSIS — Z96611 Presence of right artificial shoulder joint: Secondary | ICD-10-CM | POA: Diagnosis not present

## 2024-04-22 DIAGNOSIS — S92212D Displaced fracture of cuboid bone of left foot, subsequent encounter for fracture with routine healing: Secondary | ICD-10-CM | POA: Diagnosis not present

## 2024-04-22 DIAGNOSIS — S065XAA Traumatic subdural hemorrhage with loss of consciousness status unknown, initial encounter: Secondary | ICD-10-CM | POA: Diagnosis not present

## 2024-04-22 DIAGNOSIS — S92001B Unspecified fracture of right calcaneus, initial encounter for open fracture: Principal | ICD-10-CM | POA: Diagnosis present

## 2024-04-22 DIAGNOSIS — Z043 Encounter for examination and observation following other accident: Secondary | ICD-10-CM | POA: Diagnosis not present

## 2024-04-22 DIAGNOSIS — S92001D Unspecified fracture of right calcaneus, subsequent encounter for fracture with routine healing: Secondary | ICD-10-CM | POA: Diagnosis not present

## 2024-04-22 DIAGNOSIS — S92002A Unspecified fracture of left calcaneus, initial encounter for closed fracture: Secondary | ICD-10-CM | POA: Diagnosis present

## 2024-04-22 DIAGNOSIS — S92001A Unspecified fracture of right calcaneus, initial encounter for closed fracture: Secondary | ICD-10-CM | POA: Diagnosis not present

## 2024-04-22 DIAGNOSIS — S82892A Other fracture of left lower leg, initial encounter for closed fracture: Secondary | ICD-10-CM | POA: Diagnosis not present

## 2024-04-22 DIAGNOSIS — S92334A Nondisplaced fracture of third metatarsal bone, right foot, initial encounter for closed fracture: Secondary | ICD-10-CM | POA: Diagnosis not present

## 2024-04-22 DIAGNOSIS — W19XXXA Unspecified fall, initial encounter: Principal | ICD-10-CM

## 2024-04-22 DIAGNOSIS — S065X0A Traumatic subdural hemorrhage without loss of consciousness, initial encounter: Secondary | ICD-10-CM | POA: Diagnosis not present

## 2024-04-22 DIAGNOSIS — S82871B Displaced pilon fracture of right tibia, initial encounter for open fracture type I or II: Secondary | ICD-10-CM

## 2024-04-22 DIAGNOSIS — S92051A Displaced other extraarticular fracture of right calcaneus, initial encounter for closed fracture: Secondary | ICD-10-CM | POA: Diagnosis not present

## 2024-04-22 DIAGNOSIS — S92335A Nondisplaced fracture of third metatarsal bone, left foot, initial encounter for closed fracture: Secondary | ICD-10-CM | POA: Diagnosis not present

## 2024-04-22 DIAGNOSIS — F0390 Unspecified dementia without behavioral disturbance: Secondary | ICD-10-CM | POA: Diagnosis not present

## 2024-04-22 DIAGNOSIS — S92062A Displaced intraarticular fracture of left calcaneus, initial encounter for closed fracture: Secondary | ICD-10-CM | POA: Diagnosis not present

## 2024-04-22 DIAGNOSIS — M47817 Spondylosis without myelopathy or radiculopathy, lumbosacral region: Secondary | ICD-10-CM | POA: Diagnosis not present

## 2024-04-22 DIAGNOSIS — M160 Bilateral primary osteoarthritis of hip: Secondary | ICD-10-CM | POA: Diagnosis not present

## 2024-04-22 HISTORY — DX: Unspecified dementia, unspecified severity, without behavioral disturbance, psychotic disturbance, mood disturbance, and anxiety: F03.90

## 2024-04-22 HISTORY — DX: Essential (primary) hypertension: I10

## 2024-04-22 LAB — I-STAT CHEM 8, ED
BUN: 11 mg/dL (ref 8–23)
Calcium, Ion: 1.12 mmol/L — ABNORMAL LOW (ref 1.15–1.40)
Chloride: 103 mmol/L (ref 98–111)
Creatinine, Ser: 1 mg/dL (ref 0.44–1.00)
Glucose, Bld: 147 mg/dL — ABNORMAL HIGH (ref 70–99)
HCT: 41 % (ref 36.0–46.0)
Hemoglobin: 13.9 g/dL (ref 12.0–15.0)
Potassium: 3.5 mmol/L (ref 3.5–5.1)
Sodium: 140 mmol/L (ref 135–145)
TCO2: 22 mmol/L (ref 22–32)

## 2024-04-22 LAB — COMPREHENSIVE METABOLIC PANEL WITH GFR
ALT: 34 U/L (ref 0–44)
AST: 48 U/L — ABNORMAL HIGH (ref 15–41)
Albumin: 3.8 g/dL (ref 3.5–5.0)
Alkaline Phosphatase: 73 U/L (ref 38–126)
Anion gap: 12 (ref 5–15)
BUN: 11 mg/dL (ref 8–23)
CO2: 22 mmol/L (ref 22–32)
Calcium: 8.9 mg/dL (ref 8.9–10.3)
Chloride: 103 mmol/L (ref 98–111)
Creatinine, Ser: 1.02 mg/dL — ABNORMAL HIGH (ref 0.44–1.00)
GFR, Estimated: 55 mL/min — ABNORMAL LOW
Glucose, Bld: 151 mg/dL — ABNORMAL HIGH (ref 70–99)
Potassium: 3.6 mmol/L (ref 3.5–5.1)
Sodium: 137 mmol/L (ref 135–145)
Total Bilirubin: 0.8 mg/dL (ref 0.0–1.2)
Total Protein: 6.8 g/dL (ref 6.5–8.1)

## 2024-04-22 LAB — URINALYSIS, ROUTINE W REFLEX MICROSCOPIC
Bilirubin Urine: NEGATIVE
Glucose, UA: 150 mg/dL — AB
Ketones, ur: 5 mg/dL — AB
Leukocytes,Ua: NEGATIVE
Nitrite: NEGATIVE
Protein, ur: 30 mg/dL — AB
Specific Gravity, Urine: 1.019 (ref 1.005–1.030)
pH: 7 (ref 5.0–8.0)

## 2024-04-22 LAB — CBC
HCT: 42.2 % (ref 36.0–46.0)
Hemoglobin: 14.1 g/dL (ref 12.0–15.0)
MCH: 32 pg (ref 26.0–34.0)
MCHC: 33.4 g/dL (ref 30.0–36.0)
MCV: 95.9 fL (ref 80.0–100.0)
Platelets: 164 K/uL (ref 150–400)
RBC: 4.4 MIL/uL (ref 3.87–5.11)
RDW: 12.8 % (ref 11.5–15.5)
WBC: 6.8 K/uL (ref 4.0–10.5)
nRBC: 0 % (ref 0.0–0.2)

## 2024-04-22 LAB — ETHANOL: Alcohol, Ethyl (B): <15 mg/dL

## 2024-04-22 LAB — I-STAT CG4 LACTIC ACID, ED: Lactic Acid, Venous: 2.4 mmol/L (ref 0.5–1.9)

## 2024-04-22 LAB — SAMPLE TO BLOOD BANK

## 2024-04-22 LAB — PROTIME-INR
INR: 1.1 (ref 0.8–1.2)
Prothrombin Time: 14.3 s (ref 11.4–15.2)

## 2024-04-22 MED ORDER — HYDRALAZINE HCL 20 MG/ML IJ SOLN: 10.0000 mg | INTRAMUSCULAR | Status: DC | PRN

## 2024-04-22 MED ORDER — METOPROLOL TARTRATE 5 MG/5ML IV SOLN
5.0000 mg | Freq: Four times a day (QID) | INTRAVENOUS | Status: DC | PRN
  Administered 2024-04-28: 5 mg via INTRAVENOUS
  Filled 2024-04-22 (×2): qty 5

## 2024-04-22 MED ORDER — ONDANSETRON HCL 4 MG/2ML IJ SOLN
4.0000 mg | Freq: Four times a day (QID) | INTRAMUSCULAR | Status: DC | PRN
  Administered 2024-04-22: 4 mg via INTRAVENOUS
  Filled 2024-04-22: qty 2

## 2024-04-22 MED ORDER — DOCUSATE SODIUM 100 MG PO CAPS
100.0000 mg | ORAL_CAPSULE | Freq: Two times a day (BID) | ORAL | Status: DC
  Administered 2024-04-22 – 2024-05-17 (×44): 100 mg via ORAL
  Filled 2024-04-22 (×49): qty 1

## 2024-04-22 MED ORDER — TETANUS-DIPHTH-ACELL PERTUSSIS 5-2-15.5 LF-MCG/0.5 IM SUSP
0.5000 mL | Freq: Once | INTRAMUSCULAR | Status: AC
  Administered 2024-04-22: 0.5 mL via INTRAMUSCULAR

## 2024-04-22 MED ORDER — DONEPEZIL HCL 10 MG PO TABS
5.0000 mg | ORAL_TABLET | Freq: Every evening | ORAL | Status: DC
  Administered 2024-04-23 – 2024-05-17 (×23): 5 mg via ORAL
  Filled 2024-04-22 (×25): qty 1

## 2024-04-22 MED ORDER — METOPROLOL TARTRATE 5 MG/5ML IV SOLN
5.0000 mg | Freq: Once | INTRAVENOUS | Status: AC
  Administered 2024-04-22: 5 mg via INTRAVENOUS

## 2024-04-22 MED ORDER — OXYCODONE HCL 5 MG PO TABS
2.5000 mg | ORAL_TABLET | ORAL | Status: DC | PRN
  Administered 2024-04-23 – 2024-04-28 (×8): 2.5 mg via ORAL
  Filled 2024-04-22 (×8): qty 1

## 2024-04-22 MED ORDER — ACETAMINOPHEN 500 MG PO TABS
1000.0000 mg | ORAL_TABLET | Freq: Four times a day (QID) | ORAL | Status: DC
  Administered 2024-04-22 – 2024-05-18 (×74): 1000 mg via ORAL
  Filled 2024-04-22 (×87): qty 2

## 2024-04-22 MED ORDER — ONDANSETRON 4 MG PO TBDP: 4.0000 mg | ORAL_TABLET | Freq: Four times a day (QID) | ORAL | Status: DC | PRN

## 2024-04-22 MED ORDER — MORPHINE SULFATE (PF) 2 MG/ML IV SOLN
1.0000 mg | INTRAVENOUS | Status: DC | PRN
  Administered 2024-04-22: 2 mg via INTRAVENOUS
  Administered 2024-04-22: 1 mg via INTRAVENOUS
  Administered 2024-04-29 – 2024-05-07 (×3): 2 mg via INTRAVENOUS
  Filled 2024-04-22 (×5): qty 1

## 2024-04-22 MED ORDER — METHOCARBAMOL 500 MG PO TABS
1000.0000 mg | ORAL_TABLET | Freq: Three times a day (TID) | ORAL | Status: DC
  Administered 2024-04-22 – 2024-05-01 (×27): 1000 mg via ORAL
  Administered 2024-05-02: 500 mg via ORAL
  Administered 2024-05-02: 1000 mg via ORAL
  Filled 2024-04-22 (×32): qty 2

## 2024-04-22 MED ORDER — AMLODIPINE BESYLATE 5 MG PO TABS
5.0000 mg | ORAL_TABLET | Freq: Every day | ORAL | Status: DC
  Administered 2024-04-24 – 2024-05-17 (×23): 5 mg via ORAL
  Filled 2024-04-22 (×26): qty 1

## 2024-04-22 MED ORDER — SERTRALINE HCL 100 MG PO TABS
100.0000 mg | ORAL_TABLET | Freq: Every evening | ORAL | Status: DC
  Administered 2024-04-23 – 2024-05-17 (×24): 100 mg via ORAL
  Filled 2024-04-22 (×5): qty 1
  Filled 2024-04-22: qty 2
  Filled 2024-04-22 (×3): qty 1
  Filled 2024-04-22: qty 2
  Filled 2024-04-22 (×10): qty 1
  Filled 2024-04-22: qty 2
  Filled 2024-04-22 (×5): qty 1

## 2024-04-22 MED ORDER — POLYETHYLENE GLYCOL 3350 17 G PO PACK
17.0000 g | PACK | Freq: Every day | ORAL | Status: DC | PRN
  Administered 2024-04-24 – 2024-04-25 (×2): 17 g via ORAL
  Filled 2024-04-22 (×2): qty 1

## 2024-04-22 MED ORDER — FENTANYL CITRATE (PF) 50 MCG/ML IJ SOSY: 25.0000 ug | PREFILLED_SYRINGE | Freq: Once | INTRAMUSCULAR | Status: AC

## 2024-04-22 MED ORDER — LAMOTRIGINE 25 MG PO TABS
75.0000 mg | ORAL_TABLET | Freq: Every day | ORAL | Status: DC
  Administered 2024-04-22 – 2024-05-17 (×26): 75 mg via ORAL
  Filled 2024-04-22 (×27): qty 3

## 2024-04-22 MED ORDER — ORAL CARE MOUTH RINSE: 15.0000 mL | OROMUCOSAL | Status: DC | PRN

## 2024-04-22 MED ORDER — CEFAZOLIN SODIUM-DEXTROSE 2-4 GM/100ML-% IV SOLN
2.0000 g | Freq: Once | INTRAVENOUS | Status: AC
  Administered 2024-04-22: 2 g via INTRAVENOUS

## 2024-04-22 MED ADMIN — Levetiracetam Inj 500 MG/5ML (100 MG/ML): 500 mg | INTRAVENOUS | NDC 00409188622

## 2024-04-22 MED ADMIN — Quetiapine Fumarate Tab 50 MG: 50 mg | ORAL | NDC 67877024901

## 2024-04-22 MED ADMIN — Fentanyl Citrate PF Soln Prefilled Syringe 50 MCG/ML: 25 ug | INTRAVENOUS | NDC 63323080801

## 2024-04-22 MED ADMIN — Iohexol IV Soln 350 MG/ML: 75 mL | INTRAVENOUS | NDC 00407141490

## 2024-04-22 MED FILL — Quetiapine Fumarate Tab 50 MG: 50.0000 mg | ORAL | Qty: 1 | Status: AC

## 2024-04-22 MED FILL — Lorazepam Inj 2 MG/ML: 1.0000 mg | INTRAMUSCULAR | Qty: 1 | Status: AC

## 2024-04-22 MED FILL — Lorazepam Inj 2 MG/ML: 1.0000 mg | INTRAMUSCULAR | Qty: 1 | Status: CN

## 2024-04-22 MED FILL — Fentanyl Citrate PF Soln Prefilled Syringe 50 MCG/ML: 25.0000 ug | INTRAMUSCULAR | Qty: 1 | Status: AC

## 2024-04-22 MED FILL — Tramadol HCl Tab 50 MG: 25.0000 mg | ORAL | Qty: 1 | Status: AC

## 2024-04-22 MED FILL — Levetiracetam Inj 500 MG/5ML (100 MG/ML): 500.0000 mg | INTRAVENOUS | Qty: 5 | Status: AC

## 2024-04-22 MED FILL — Metoprolol Tartrate IV Soln 5 MG/5ML: INTRAVENOUS | Qty: 5 | Status: AC

## 2024-04-22 NOTE — Consult Note (Signed)
 ORTHOPAEDIC CONSULTATION  REQUESTING PHYSICIAN: Md, Trauma, MD  PCP:  Caleen Dirks, MD  Chief Complaint: Level 1 trauma, just out of window. Bilateral LE injuries  HPI: Shirley Woods is a 83 y.o. female Level 1 trauma s/p jump out of window trying to escape her memory care unit at [facility]. Reported history of dementia. Trauma work-up with left subdural hematoma. Possible right lower lobe PE, possible common femoral vein DVT. X-rays showed open right ankle fracture, right 3rd metatarsal fracture, bilateral calcaneal fractures, left 2nd metatarsal fracture. Orthopedics consulted. EDP started IV abx and tetanus per open fracture protocol, open right wound irrigated and bilateral LE splints in place. Patients family at bedside. She is alert and able to answer most questions. She denies any tingling or numbness in LE bilaterally. No significant pain now. Bilateral LE splints.    No past medical history on file.  Social History   Socioeconomic History   Marital status: Married    Spouse name: Not on file   Number of children: Not on file   Years of education: Not on file   Highest education level: Not on file  Occupational History   Not on file  Tobacco Use   Smoking status: Not on file   Smokeless tobacco: Not on file  Substance and Sexual Activity   Alcohol use: Not on file   Drug use: Not on file   Sexual activity: Not on file  Other Topics Concern   Not on file  Social History Narrative   Not on file   Social Drivers of Health   Tobacco Use: Not on file  Financial Resource Strain: Not on file  Food Insecurity: Not on file  Transportation Needs: Not on file  Physical Activity: Not on file  Stress: Not on file  Social Connections: Not on file  Depression (EYV7-0): Not on file  Alcohol Screen: Not on file  Housing: Not on file  Utilities: Not on file  Health Literacy: Not on file   No family history on file. Allergies[1] Prior to Admission medications  Not  on File   DG Foot Complete Right Result Date: 04/22/2024 CLINICAL DATA:  Clemens from third story window EXAM: RIGHT FOOT COMPLETE - 3+ VIEW; LEFT FOOT - COMPLETE 3+ VIEW COMPARISON:  04/22/2024 FINDINGS: Right foot: Frontal, oblique, and lateral views of the right foot are obtained. Casting material is in place. The open comminuted calcaneal fracture seen previously is again noted, with no change in alignment. The lateral malleolar fracture is better visualized on the previous ankle evaluation. No other acute displaced fractures. There is diffuse osteoarthritis throughout the midfoot and interphalangeal joints, with degenerative subluxation at the fourth and fifth proximal interphalangeal joints. Diffuse soft tissue swelling of the ankle and hindfoot. Left foot: Frontal, oblique, lateral views of the left foot are obtained. Casting material obscures underlying bony detail. Comminuted intra-articular calcaneal fracture is identified, in stable alignment. There is a minimally displaced extra-articular transverse fracture at the base of the third metatarsal, best seen on the lateral and oblique projections. No other acute bony abnormalities. 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 IMPRESSION: Right foot: 1. Open comminuted calcaneal fracture similar to prior study. 2. The known lateral malleolar fracture is better visualized on the preceding ankle study. 3. Multifocal osteoarthritis. Left foot: 1. Comminuted intra-articular calcaneal fracture similar to prior study. 2. Nondisplaced transverse extra-articular fracture at the base of the third metatarsal. 3. 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 Electronically Signed   By: Ozell  Delores M.D.   On: 04/22/2024 20:34   DG Foot Complete Left Result Date: 04/22/2024 CLINICAL DATA:  Clemens from third story window EXAM: RIGHT FOOT COMPLETE - 3+ VIEW; LEFT FOOT - COMPLETE 3+ VIEW COMPARISON:  04/22/2024 FINDINGS: Right foot: Frontal, oblique, and  lateral views of the right foot are obtained. Casting material is in place. The open comminuted calcaneal fracture seen previously is again noted, with no change in alignment. The lateral malleolar fracture is better visualized on the previous ankle evaluation. No other acute displaced fractures. There is diffuse osteoarthritis throughout the midfoot and interphalangeal joints, with degenerative subluxation at the fourth and fifth proximal interphalangeal joints. Diffuse soft tissue swelling of the ankle and hindfoot. Left foot: Frontal, oblique, lateral views of the left foot are obtained. Casting material obscures underlying bony detail. Comminuted intra-articular calcaneal fracture is identified, in stable alignment. There is a minimally displaced extra-articular transverse fracture at the base of the third metatarsal, best seen on the lateral and oblique projections. No other acute bony abnormalities. 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 IMPRESSION: Right foot: 1. Open comminuted calcaneal fracture similar to prior study. 2. The known lateral malleolar fracture is better visualized on the preceding ankle study. 3. Multifocal osteoarthritis. Left foot: 1. Comminuted intra-articular calcaneal fracture similar to prior study. 2. Nondisplaced transverse extra-articular fracture at the base of the third metatarsal. 3. 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 Electronically Signed   By: Ozell Delores M.D.   On: 04/22/2024 20:34   DG Ankle Right Port Result Date: 04/22/2024 EXAM: 1 OR 2 VIEW(S) XRAY OF THE ANKLE 04/22/2024 07:23:09 PM CLINICAL HISTORY: Fracture, open [id number] COMPARISON: None available. FINDINGS: BONES AND JOINTS: Acute displaced and mildly comminuted lateral malleolus fracture. Possible lateral talus avulsion fracture. Acute comminuted calcaneus fractures with subtalar joints malalignment. Limited bone evaluation reveals no definite medial or posterior malleolar fractures.  Midfoot degenerative changes are present. SOFT TISSUES: Extensive soft tissue swelling with soft tissue emphysema overlying the lateral malleolus. IMPRESSION: 1. Acute displaced and mildly comminuted lateral malleolus fracture. 2. Possible lateral talus avulsion fracture. 3. Acute comminuted calcaneus fractures with subtalar joint malalignment. 4. Recommend CT for further evaluation. Electronically signed by: Morgane Naveau MD 04/22/2024 07:41 PM EST RP Workstation: [id number]   DG Ankle Left Port Result Date: 04/22/2024 EXAM: 1 OR 2 VIEW(S) XRAY OF THE UNSPECIFIED ANKLE 04/22/2024 07:23:09 PM CLINICAL HISTORY: Fracture, open [id number] COMPARISON: None available. FINDINGS: BONES AND JOINTS: Acute comminuted and displaced fracture of the anterior process of the calcaneus. Acute minimally displaced fracture of the base of the second metatarsal. Fracture not well visualized. Disruption of the subtalar joint. No acute medial, lateral, posterior malleolar fracture. No dislocation. SOFT TISSUES: Subcutaneous soft tissue edema. IMPRESSION: 1. Acute comminuted and displaced fracture of the anterior process of the calcaneus. 2. Acute minimally displaced fracture of the base of the second metatarsal, not well visualized. 3. Recommended dedicated 3-view left foot radiographs. Electronically signed by: Morgane Naveau MD 04/22/2024 07:35 PM EST RP Workstation: [id number]   CT CERVICAL SPINE WO CONTRAST Result Date: 04/22/2024 EXAM: CT CERVICAL SPINE WITHOUT CONTRAST 04/22/2024 06:58:00 PM TECHNIQUE: CT of the cervical spine was performed without the administration of intravenous contrast. Multiplanar reformatted images are provided for review. Automated exposure control, iterative reconstruction, and/or weight based adjustment of the mA/kV was utilized to reduce the radiation dose to as low as reasonably achievable. COMPARISON: None available. CLINICAL HISTORY: Polytrauma, blunt. FINDINGS: CERVICAL SPINE: BONES AND  ALIGNMENT: Trace degenerative anterolisthesis of C7 on  T1, likely related to facet arthrosis. No evidence of traumatic malalignment. No acute fracture. DEGENERATIVE CHANGES: Disc space narrowing most pronounced at C4-C5 and C5-C6. Degenerative endplate osteophytes and endplate irregularity particularly at C4-C5 and C5-C6. Facet arthrosis and uncovertebral hypertrophy at multiple levels. Foraminal stenosis at multiple levels throughout the cervical spine, most pronounced on the right at C7-T1. There is no evidence of high grade osseous spinal canal stenosis. SOFT TISSUES: No prevertebral soft tissue swelling. Heterogeneous appearance of the thyroid with multiple nodules noted. The largest nodule in the left thyroid lobe measures approximately 1.3 cm in diameter. LUNGS: Biapical pleural scarring. IMPRESSION: 1. No acute abnormality of the cervical spine. 2. Multilevel degenerative changes as above. 3. Findings discussed with Dr. Paola at 7:10PM on 04/22/24. Electronically signed by: Donnice Mania MD 04/22/2024 07:21 PM EST RP Workstation: [id number]   CT CHEST ABDOMEN PELVIS W CONTRAST Result Date: 04/22/2024 EXAM: CT CHEST, ABDOMEN AND PELVIS WITH CONTRAST 04/22/2024 06:58:00 PM TECHNIQUE: CT of the chest, abdomen and pelvis was performed with the administration of 75 mL of iohexol  (OMNIPAQUE ) 350 MG/ML injection. Multiplanar reformatted images are provided for review. Automated exposure control, iterative reconstruction, and/or weight based adjustment of the mA/kV was utilized to reduce the radiation dose to as low as reasonably achievable. COMPARISON: None available. CLINICAL HISTORY: Polytrauma, blunt. FINDINGS: CHEST: MEDIASTINUM AND LYMPH NODES: Heart and pericardium are unremarkable. The central airways are clear. No mediastinal, hilar or axillary lymphadenopathy. Left thyroid nodules, better evaluated on prior thyroid ultrasound, no follow-up recommended. LUNGS AND PLEURA: Trace left pleural effusion.  Equivocal subsegmental filling defect in the right lower lobe pulmonary artery (image 30), although poorly evaluated. No focal consolidation or pulmonary edema. No pneumothorax. ABDOMEN AND PELVIS: LIVER: The liver is unremarkable. GALLBLADDER AND BILE DUCTS: Gallbladder is unremarkable. No biliary ductal dilatation. SPLEEN: Calcified splenic granulomas. PANCREAS: No acute abnormality. ADRENAL GLANDS: No acute abnormality. KIDNEYS, URETERS AND BLADDER: No stones in the kidneys or ureters. No hydronephrosis. No perinephric or periureteral stranding. Urinary bladder is unremarkable. GI AND BOWEL: Moderate hiatal hernia. Colonic diverticulosis without acute diverticulitis. Stomach demonstrates no acute abnormality. There is no bowel obstruction. REPRODUCTIVE ORGANS: Status post hysterectomy. PERITONEUM AND RETROPERITONEUM: No ascites. No free air. VASCULATURE: Aorta is normal in caliber. Severe atherosclerotic plaque, including aortic atherosclerosis. Possible filling defect within left common femoral vein (image 106). ABDOMINAL AND PELVIS LYMPH NODES: No lymphadenopathy. BONES AND SOFT TISSUES: Right shoulder arthroplasty noted. Old left lower posterolateral rib fracture deformities. Mild degenerative changes of the thoracolumbar spine. No acute osseous abnormality. No focal soft tissue abnormality. IMPRESSION: 1. No acute traumatic injury to the chest, abdomen, or pelvis. 2. Possible deep venous thrombosis within the left common femoral vein, consider lower extremity Doppler for confirmation. 3. Possible subsegmental pulmonary embolus in the right lower lobe pulmonary artery, equivocal. In the absence of DVT, this would likely reflect artifact on this non-dedicated evaluation. 4. Trace left pleural effusion. 5. Critical Value/emergent results were called by telephone at the time of interpretation on 04/22/2024 at 1910 hrs to provider Dr Paola. Electronically signed by: Pinkie Pebbles MD 04/22/2024 07:14 PM EST RP  Workstation: [id number]   CT Head Wo Contrast Result Date: 04/22/2024 EXAM: CT HEAD WITHOUT 04/22/2024 06:58:00 PM TECHNIQUE: CT of the head was performed without the administration of intravenous contrast. Automated exposure control, iterative reconstruction, and/or weight based adjustment of the mA/kV was utilized to reduce the radiation dose to as low as reasonably achievable. COMPARISON: None available. CLINICAL HISTORY: Polytrauma, blunt, pregnant; LEVEL  1 BLUNT FINDINGS: BRAIN AND VENTRICLES: Acute subdural hematoma along the left cerebral convexity measuring up to 5 mm in maximum thickness. No significant associated mass effect. No midline shift. No evidence of parenchymal hemorrhage. No intraventricular hemorrhage. Chronic microvascular ischemic changes noted. Mild generalized volume loss. No evidence of acute infarct. No hydrocephalus. ORBITS: Bilateral lens replacement. SINUSES AND MASTOIDS: No acute abnormality. SOFT TISSUES AND SKULL: Atherosclerosis of the carotid siphons. No acute skull fracture. No acute soft tissue abnormality. IMPRESSION: 1. Acute left convexity subdural hematoma measuring up to 5 mm without significant mass effect or midline shift. 2. No parenchymal or intraventricular hemorrhage. 3. TBI risk stratification: moderate - mbig 2. 4. Findings discussed with Dr. Paola at 7:10 PM on 04/22/24. Electronically signed by: Donnice Mania MD 04/22/2024 07:13 PM EST RP Workstation: [id number]   DG Pelvis Portable Result Date: 04/22/2024 CLINICAL DATA:  Fall from 30 feet. EXAM: PORTABLE PELVIS 1-2 VIEWS COMPARISON:  None Available. FINDINGS: Minimal symmetric osteoarthritic change of the hips. No acute fracture of the hips or pelvis. Moderate degenerative change of the spine. IMPRESSION: 1. No acute findings. 2. Minimal symmetric osteoarthritic change of the hips. Electronically Signed   By: Toribio Agreste M.D.   On: 04/22/2024 18:50   DG Chest Port 1 View Result Date:  04/22/2024 CLINICAL DATA:  Trauma. EXAM: PORTABLE CHEST 1 VIEW COMPARISON:  None Available. FINDINGS: No focal consolidation, pleural effusion or pneumothorax. Top-normal cardiac size. No acute osseous pathology. Right shoulder arthroplasty. IMPRESSION: No active disease. Electronically Signed   By: Vanetta Chou M.D.   On: 04/22/2024 18:50    Positive ROS: All other systems have been reviewed and were otherwise negative with the exception of those mentioned in the HPI and as above.  Physical Exam: General: Alert, no acute distress. She is oriented to self and place only.  Cardiovascular: No pedal edema Respiratory: No cyanosis, no use of accessory musculature GI: No organomegaly, abdomen is soft and non-tender Neurologic: Sensation intact distally Psychiatric: She is alert but at baseline, family at bedside. Lymphatic: No axillary or cervical lymphadenopathy  MUSCULOSKELETAL:  No skin wounds or lesions back, no TTP over spinous processes.   Examination of bilateral UE no skin wounds or lesions. No deformity. No TTP. Sensation intact to light touch motion function intact active ROM UE on her on without pain  shoulder/elbow/wrist/hands. Distal radial and ulnar pulses 2+ bilaterally. Capillary refill <2 seconds.   Examination of LLE no skin wounds or lesions, mild abrasion on foot with contusion and swelling and deformity noted. Painless log rolling of the hip. No TTP left knee. She is moving extremity on her own at hip and knee. She is moving toes well on exam. Sensation intact to light touch. Motor function intact. Distal pedal pulses 2+, capillary refill <2 seconds.  Examination RLE only wound medial right ankle. Painless log rolling of the hip. No TTP right knee. Short leg splint in place. Media tab with image right ankle. DP pulses 2+. Capillary refill <2 seconds. She is moving extremity on her own at hip and knee. She is moving toes well on exam.   Compartments soft in LE  bilaterally.   Assessment: Bilateral calcaneous fractures Right open ankle fracture.  Right 3rd metatarsal fracture Left second metatarsal fracture.  Dementia Subdural bleed.  Femoral DVT/PE.   Plan: Discussed findings with patient and her family. Patient undergoing medical work-up for subdural bleed, under care of the trauma team.  In regards to bilateral LE injuries she has multiple  fractures with open right ankle fracture on the right, bilateral LE short leg splints intact. CT left and right foot pending.  Patient to remain NWB bilateral LE. Plan for surgery tomorrow with OTS with Dr. Kendal tomorrow pending medical stability. NPO MN tonight.    Valery GORMAN Potters, PA-C    04/22/2024 8:59 PM     [1] No Known Allergies

## 2024-04-22 NOTE — Progress Notes (Signed)
 Medication history tech is attempting to get MAR from [facility] to complete med rec.  Physician order list from [facility] at bedside with following medications: (last doses unknown at this time) PRN acetaminophen  Norvasc  5 mg daily Donepezil  5mg  daily Lamotrigine  75 mg (half of 150mg  tablet) HS Lorazepam  0.5 mg q6h PRN Lorazepam  1mg  evening PRN Seroquel  50 mg BID Sertraline  150 mg daily Zinc gluconate 50 mg daily  See medication history list once completed for full history and for last doses.  Dorn Buttner, PharmD, BCPS 04/22/2024 8:41 PM ED Clinical Pharmacist -  [phone]

## 2024-04-22 NOTE — H&P (Addendum)
° °  TRAUMA H&P  04/22/2024, 6:42 PM   Chief Complaint: Level 1 trauma activation for jump out of window  Primary Survey:  ABC's intact on arrival Arrived with c-collar in place  The patient is an 83 y.o. female.   HPI: 62F s/p jump out of window trying to escape her memory care unit. Reported history of dementia. No documentation sent from facility. Alert and communicative on arrival.  No past medical history on file.  No pertinent family history.  Social History:  has no history on file for tobacco use, alcohol use, and drug use.    Allergies: Allergies[1]  Medications: reviewed  No results found for this or any previous visit (from the past 48 hours).  No results found.  ROS 10 point review of systems is negative except as listed above in HPI.  There were no vitals taken for this visit.  Secondary Survey:  GCS: E(4)//V(5)//M(6) Constitutional: well-developed, well-nourished Skull: normocephalic, atraumatic Eyes: pupils equal, round, reactive to light, 2mm b/l, moist conjunctiva Face/ENT: midface stable without deformity, poor  dentition, external inspection of ears and nose normal, hearing intact  Oropharynx: normal oropharyngeal mucosa, no blood  Neck: no thyromegaly, trachea midline, c-collar in place on arrival, no midline cervical tenderness to palpation, no C-spine stepoffs Chest: breath sounds equal bilaterally, normal  respiratory effort, no midline or lateral chest wall tenderness to palpation/deformity Abdomen: soft, NT, no bruising, no hepatosplenomegaly FAST: negative Pelvis: stable GU: normal female genitalia Back: no wounds, no T/L spine TTP, no T/L spine stepoffs Rectal: deferred Extremities: 2+  radial and pedal pulses bilaterally, intact motor and sensation of bilateral UE and LE, fullness of L ankle, laceration to medial R ankle with suspected underlying fracture MSK: unable to assess gait/station, no clubbing/cyanosis of fingers/toes Skin: warm,  dry, no rashes  CXR in TB: unremarkable Pelvis XR in TB: unremarkable  Procedures in TB: FAST, performed by EDP    Assessment/Plan: Plan 62F jump from window  Open R ankle fx, B calcaneus fx, L 2nd metatarsal fx - ortho c/s, Dr. Fidel, ancef  given in TB L SDH - NSGY c/s, Dr. Lanis, keppra  x7d for sz ppx ?L CFV DVT - could be venous swirling, recommend US  to confirm, also ?RLL subsegmental PE FEN - NPO except sips/chips until operative plan clarified DVT - SCDs, LMWH when cleared by NSGY Dispo - Admit to inpatient--ICU  Family update: none present  Dreama GEANNIE Hanger, MD General and Trauma Surgery [facility] Surgery      [1] No Known Allergies

## 2024-04-22 NOTE — Progress Notes (Signed)
 Trauma Response Nurse Documentation   Shirley Woods is a 83 y.o. female arriving to [facility] ED via EMS  On No antithrombotic. Trauma was activated as a Level 1 by ED charge RN based on the following trauma criteria Falls > 20 ft. with adults, >10 ft. with children (<15). Third story fall Patient cleared for CT by Dr. Paola trauma MD. Pt transported to CT with trauma response nurse present to monitor. RN remained with the patient throughout their absence from the department for clinical observation.   GCS 14 repetitive questioning.  Trauma MD Arrival Time: [place] [address].  History   No past medical history on file.       Initial Focused Assessment (If applicable, or please see trauma documentation): Confused female presents via EMS from SNF after a fall from her third story window; pt attempted to escape facility with a sheet hanging from her window to reach second story awning and fall down into mulch below. Open R ankle fx, left ankle and foot deformity, c/o bilateral shoulder pain and lower back pain.  Airway patent, BS clear Bleeding from right ankle controlled with moist guaze dressing GCS 14 repetitive questioning, hx dementia, PERRLA 2  CT's Completed:   CT Head, CT C-Spine, CT Chest w/ contrast, and CT abdomen/pelvis w/ contrast   Interventions:  IV start and trauma lab draw Portable chest, pelvis, bilateral ankle XRAYS CT head, c-spine, CAP, bilateral ankles, left foot TDAP Ancef  C-spine cleared by Dr. Sebastian Fentanyl , morphine  for pain control Bilateral bulky jones splints to ankles, see ortho tech note 04/22/24 CAGEAID  Plan for disposition:  Admission to ICU   Consults completed:  Neurosurgeon Nundkumar called directly by Dr. Sebastian at 1946 [facility] paged 1908, call returned to EDP 1946 Trauma [place] to bedside 1812, Thompson to bedside 1945  Event Summary: Presents via EMS from [facility] memory care after attempting to escape out  of window, falling from third story window. Pt with R open ankle fx, deformity to left ankle and left foot, c/o pain to bilateral shoulders and lower back. CMS intact to extremities, bleeding controled. TDAP/ancef  given in trauma bay. Pt with repetitive questioning but presents with a baseline of dementia (per other chart, pt has had worsening confusion lately and has been seen twice in December of this year for confusion with negative workups, thought to be worsening dementia). C/s to ortho and neurosurgery for ankle/foot fxs and SDH. Admit to ICU.   Bedside handoff with ED RN Ole.    Teshia Mahone O Asanti Craigo  Trauma Response RN  Please call TRN at [phone] for further assistance.

## 2024-04-22 NOTE — Progress Notes (Signed)
 RT responded to Level 1 trauma page.  RT currently not needed at this time.  RN aware to call RT if needing RT interventions.

## 2024-04-22 NOTE — Progress Notes (Signed)
°   04/22/24 1838  Spiritual Encounters  Type of Visit Attempt (pt unavailable)  Conversation partners present during encounter Nurse  Referral source Trauma page  Reason for visit Trauma  OnCall Visit Yes   Responded to level 1 trauma due to 30 foot fall. 83 year old patient from memory care center. Patient returned from CT scan. Patient being cared for by interdisciplinary team. No family at bedside. Chaplain not needed at this time.

## 2024-04-22 NOTE — Progress Notes (Signed)
 Transition of Care [facility]) - CAGE-AID Screening   Patient Details  Name: Shirley Woods MRN: [id number] Date of Birth: August 24, 1940  [facility] CM/SW Contact:    Sallyanne MALVA Mettle, RN Phone Number: 04/22/2024, 8:32 PM    CAGE-AID Screening:    Have You Ever Felt You Ought to Cut Down on Your Drinking or Drug Use?: No Have People Annoyed You By Critizing Your Drinking Or Drug Use?: No Have You Felt Bad Or Guilty About Your Drinking Or Drug Use?: No Have You Ever Had a Drink or Used Drugs First Thing In The Morning to Steady Your Nerves or to Get Rid of a Hangover?: No CAGE-AID Score: 0  Substance Abuse Education Offered: No (pt reports occasional alcohol, however poor historian)

## 2024-04-22 NOTE — Progress Notes (Signed)
 Pt with PMH of dementia presented to ED after jumping from third story of memory care unit where she resides. Neurologically at baseline. Workup revealing small L SDH w/o MLS or mass effect. Recommend repeat CTH 8H, prophylactic Keppra . Formal consult to follow.   Shirley Woods CAYLIN Wyndi Northrup, PA-C

## 2024-04-22 NOTE — Progress Notes (Signed)
 I updated her DIL at [phone].

## 2024-04-22 NOTE — ED Notes (Signed)
 Ortho tech at bedside

## 2024-04-22 NOTE — Progress Notes (Addendum)
 Orthopedic Tech Progress Note Patient Details:  Shirley Woods 01/10/1941 [id number]  Level 1 trauma, BLE injuries. BLE splinted in bulky jones splints with stirrups once CT scans were completed. The R medial heel wound was irrigated and a non-adherent dressing was placed prior to splinting. Motion & sensation of toes and distal pulses remain intact bilaterally.  Ortho Devices Type of Ortho Device: Post (short leg) splint (BULKY JONES W STIRRUP) Ortho Device/Splint Location: BLE Ortho Device/Splint Interventions: Ordered, Application, Adjustment   Post Interventions Patient Tolerated: Fair Instructions Provided: Care of device, Adjustment of device  Keniel Ralston Ronal Brasil 04/22/2024, 7:40 PM

## 2024-04-22 NOTE — Progress Notes (Signed)
 Patient ID: Shirley Woods, female   DOB: 1940-12-25, 83 y.o.   MRN: [id number] Dr. Lanis consulted at 1950. Non-emergent.  Dann Hummer, MD, MPH, FACS Please use [email or web address] to contact on call provider

## 2024-04-22 NOTE — ED Notes (Signed)
 Pt's daughter Tylene requesting updates when they become available.

## 2024-04-22 NOTE — ED Provider Notes (Signed)
 [place] EMERGENCY DEPARTMENT AT [facility] Provider Note   CSN: [id number] Arrival date & time: 04/22/24  8175     Patient presents with: Trauma   Shirley Woods is a 83 y.o. female. Hx of dementia presenting as a level 1 trauma after jumping from a third story window.  History per EMS, history limited from patient's secondary to significant dementia.  Per report, patient likely was attempting to escape her nursing facility.  She reportedly threw a bedsheet at the window, and then attempted to crawl out the window, however she fell approximately 30 feet, landing on her legs.  She developed bilateral open ankle fractures per report, mildly tachycardic, however otherwise hemodynamically stable.  No medications prior to arrival.  On arrival, GCS 14 (confused answering, at baseline), mildly tachycardic, otherwise hemodynamically stable.  Patient endorses mild low back pain, otherwise denies any pain.  Open fractures present to bilateral ankles.   HPI     Prior to Admission medications  Medication Sig Start Date End Date Taking? Authorizing Provider  acetaminophen  (TYLENOL ) 500 MG tablet Take 500 mg by mouth every 6 (six) hours as needed for mild pain (pain score 1-3) or moderate pain (pain score 4-6).   Yes [provider]  LORazepam  (ATIVAN ) 0.5 MG tablet Take 0.5 mg by mouth every 6 (six) hours as needed for anxiety.   Yes [provider]  amLODipine  (NORVASC ) 5 MG tablet Take 5 mg by mouth daily. 03/31/24   [provider]  donepezil  (ARICEPT ) 5 MG tablet Take 5 mg by mouth every evening. 04/07/24   [provider]  lamoTRIgine  (LAMICTAL ) 150 MG tablet Take 75 mg by mouth at bedtime.    [provider]  LORazepam  (ATIVAN ) 1 MG tablet Take 1 mg by mouth every evening. 04/01/24   [provider]  QUEtiapine  (SEROQUEL ) 50 MG tablet Take 50 mg by mouth 2 (two) times daily. 02/27/24   [provider]  sertraline   (ZOLOFT ) 100 MG tablet Take 100 mg by mouth every evening. 03/22/24   [provider]  zinc gluconate 50 MG tablet Take 50 mg by mouth daily.    [provider]    Allergies: Patient has no known allergies.    Review of Systems  Updated Vital Signs BP (!) 113/55   Pulse 91   Temp 97.8 F (36.6 C) (Temporal)   Resp 16   Ht 5' 2 (1.575 m)   Wt 68 kg   SpO2 97%   BMI 27.44 kg/m   Physical Exam Vitals and nursing note reviewed.  Constitutional:      General: She is not in acute distress.    Appearance: She is well-developed.     Comments: GCS 14, confused (at baseline).  HENT:     Head: Normocephalic and atraumatic.     Comments: No lacerations or abrasions to cranium    Nose:     Comments: No nasal septal hematoma    Mouth/Throat:     Mouth: Mucous membranes are moist.     Pharynx: Oropharynx is clear.  Eyes:     Conjunctiva/sclera: Conjunctivae normal.  Neck:     Comments: In c-collar, no midline C/T/L spine TTP Cardiovascular:     Rate and Rhythm: Regular rhythm. Tachycardia present.     Pulses: Normal pulses.     Heart sounds: Normal heart sounds. No murmur heard. Pulmonary:     Effort: Pulmonary effort is normal. No respiratory distress.     Breath  sounds: Normal breath sounds. No stridor. No wheezing, rhonchi or rales.  Abdominal:     Palpations: Abdomen is soft.     Tenderness: There is abdominal tenderness. There is no right CVA tenderness, left CVA tenderness, guarding or rebound.     Comments: TTP RLQ, negative FAST exam  Musculoskeletal:        General: Swelling, tenderness, deformity and signs of injury present.     Comments: Obvious deformity to bilateral lower extremities.  There is an open laceration present over the right medial ankle, with bony extrusion.  2+ DP pulses, PT pulse on the left lower extremity, unable to appreciate PT pulse on the right lower extremity secondary to open fracture where PT pulses are appreciated. Able to  wiggle toes.  Reports normal sensation.  Skin:    General: Skin is warm and dry.     Capillary Refill: Capillary refill takes less than 2 seconds.  Neurological:     Mental Status: She is alert.     Comments: GCS 14 (at baseline, confused at baseline)  Psychiatric:        Mood and Affect: Mood normal.     (all labs ordered are listed, but only abnormal results are displayed) Labs Reviewed  COMPREHENSIVE METABOLIC PANEL WITH GFR - Abnormal; Notable for the following components:      Result Value   Glucose, Bld 151 (*)    Creatinine, Ser 1.02 (*)    AST 48 (*)    GFR, Estimated 55 (*)    All other components within normal limits  URINALYSIS, ROUTINE W REFLEX MICROSCOPIC - Abnormal; Notable for the following components:   Color, Urine STRAW (*)    Glucose, UA 150 (*)    Hgb urine dipstick SMALL (*)    Ketones, ur 5 (*)    Protein, ur 30 (*)    Bacteria, UA RARE (*)    All other components within normal limits  I-STAT CHEM 8, ED - Abnormal; Notable for the following components:   Glucose, Bld 147 (*)    Calcium, Ion 1.12 (*)    All other components within normal limits  I-STAT CG4 LACTIC ACID, ED - Abnormal; Notable for the following components:   Lactic Acid, Venous 2.4 (*)    All other components within normal limits  MRSA NEXT GEN BY PCR, NASAL  CBC  ETHANOL  PROTIME-INR  CBC  BASIC METABOLIC PANEL WITH GFR  SAMPLE TO BLOOD BANK    EKG: None  Radiology: CT FOOT LEFT WO CONTRAST Result Date: 04/22/2024 EXAM: CT OF THE LEFT FOOT, WITHOUT IV CONTRAST 04/22/2024 09:31:59 PM TECHNIQUE: Axial images were acquired through the left foot without IV contrast. Reformatted images were reviewed. Automated exposure control, iterative reconstruction, and/or weight based adjustment of the mA/kV was utilized to reduce the radiation dose to as low as reasonably achievable. COMPARISON: Plain films today. CLINICAL HISTORY: Fracture, foot. FINDINGS: BONES: Highly comminuted fracture  noted through the calcaneus. Fracture noted through the medial navicular. Nondisplaced fracture through the cuboid. Nondisplaced fracture through the base of the 3rd metatarsal. JOINTS: No dislocation. The joint spaces are normal. SOFT TISSUES: The soft tissues are unremarkable. IMPRESSION: 1. Highly comminuted fracture of the calcaneus. 2. Fracture of the medial navicular. 3. Nondisplaced fracture of the cuboid. 4. Nondisplaced fracture of the base of the third metatarsal. Electronically signed by: Franky Crease MD 04/22/2024 10:10 PM EST RP Workstation: [id number]   CT FOOT RIGHT WO CONTRAST Result Date: 04/22/2024 EXAM: CT OF THE RIGHT  FOOT, WITHOUT IV CONTRAST 04/22/2024 09:31:59 PM TECHNIQUE: Axial images were acquired through the right foot without IV contrast. Reformatted images were reviewed. Automated exposure control, iterative reconstruction, and/or weight based adjustment of the mA/kV was utilized to reduce the radiation dose to as low as reasonably achievable. COMPARISON: Plain films today. CLINICAL HISTORY: Fracture, foot. FINDINGS: BONES: Highly comminuted and displaced calcaneal fracture. Lateral malleolar fracture noted. JOINTS: No dislocation. The joint spaces are normal. SOFT TISSUES: Gas throughout the soft tissues compatible with open fracture. IMPRESSION: 1. Highly comminuted and displaced calcaneal fracture. 2. Lateral malleolar fracture. 3. Gas throughout the soft tissues compatible with open fracture. Electronically signed by: Franky Crease MD 04/22/2024 09:43 PM EST RP Workstation: [id number]   DG Foot Complete Right Result Date: 04/22/2024 CLINICAL DATA:  Clemens from third story window EXAM: RIGHT FOOT COMPLETE - 3+ VIEW; LEFT FOOT - COMPLETE 3+ VIEW COMPARISON:  04/22/2024 FINDINGS: Right foot: Frontal, oblique, and lateral views of the right foot are obtained. Casting material is in place. The open comminuted calcaneal fracture seen previously is again noted, with no change in  alignment. The lateral malleolar fracture is better visualized on the previous ankle evaluation. No other acute displaced fractures. There is diffuse osteoarthritis throughout the midfoot and interphalangeal joints, with degenerative subluxation at the fourth and fifth proximal interphalangeal joints. Diffuse soft tissue swelling of the ankle and hindfoot. Left foot: Frontal, oblique, lateral views of the left foot are obtained. Casting material obscures underlying bony detail. Comminuted intra-articular calcaneal fracture is identified, in stable alignment. There is a minimally displaced extra-articular transverse fracture at the base of the third metatarsal, best seen on the lateral and oblique projections. No other acute bony abnormalities. 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 IMPRESSION: Right foot: 1. Open comminuted calcaneal fracture similar to prior study. 2. The known lateral malleolar fracture is better visualized on the preceding ankle study. 3. Multifocal osteoarthritis. Left foot: 1. Comminuted intra-articular calcaneal fracture similar to prior study. 2. Nondisplaced transverse extra-articular fracture at the base of the third metatarsal. 3. 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 Electronically Signed   By: Ozell Daring M.D.   On: 04/22/2024 20:34   DG Foot Complete Left Result Date: 04/22/2024 CLINICAL DATA:  Clemens from third story window EXAM: RIGHT FOOT COMPLETE - 3+ VIEW; LEFT FOOT - COMPLETE 3+ VIEW COMPARISON:  04/22/2024 FINDINGS: Right foot: Frontal, oblique, and lateral views of the right foot are obtained. Casting material is in place. The open comminuted calcaneal fracture seen previously is again noted, with no change in alignment. The lateral malleolar fracture is better visualized on the previous ankle evaluation. No other acute displaced fractures. There is diffuse osteoarthritis throughout the midfoot and interphalangeal joints, with degenerative  subluxation at the fourth and fifth proximal interphalangeal joints. Diffuse soft tissue swelling of the ankle and hindfoot. Left foot: Frontal, oblique, lateral views of the left foot are obtained. Casting material obscures underlying bony detail. Comminuted intra-articular calcaneal fracture is identified, in stable alignment. There is a minimally displaced extra-articular transverse fracture at the base of the third metatarsal, best seen on the lateral and oblique projections. No other acute bony abnormalities. 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 IMPRESSION: Right foot: 1. Open comminuted calcaneal fracture similar to prior study. 2. The known lateral malleolar fracture is better visualized on the preceding ankle study. 3. Multifocal osteoarthritis. Left foot: 1. Comminuted intra-articular calcaneal fracture similar to prior study. 2. Nondisplaced transverse extra-articular fracture at the base of the third metatarsal. 3.  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 Electronically Signed   By: Ozell Daring M.D.   On: 04/22/2024 20:34   DG Ankle Right Port Result Date: 04/22/2024 EXAM: 1 OR 2 VIEW(S) XRAY OF THE ANKLE 04/22/2024 07:23:09 PM CLINICAL HISTORY: Fracture, open 96063 COMPARISON: None available. FINDINGS: BONES AND JOINTS: Acute displaced and mildly comminuted lateral malleolus fracture. Possible lateral talus avulsion fracture. Acute comminuted calcaneus fractures with subtalar joints malalignment. Limited bone evaluation reveals no definite medial or posterior malleolar fractures. Midfoot degenerative changes are present. SOFT TISSUES: Extensive soft tissue swelling with soft tissue emphysema overlying the lateral malleolus. IMPRESSION: 1. Acute displaced and mildly comminuted lateral malleolus fracture. 2. Possible lateral talus avulsion fracture. 3. Acute comminuted calcaneus fractures with subtalar joint malalignment. 4. Recommend CT for further evaluation. Electronically  signed by: Morgane Naveau MD 04/22/2024 07:41 PM EST RP Workstation: [id number]   DG Ankle Left Port Result Date: 04/22/2024 EXAM: 1 OR 2 VIEW(S) XRAY OF THE UNSPECIFIED ANKLE 04/22/2024 07:23:09 PM CLINICAL HISTORY: Fracture, open 03936 COMPARISON: None available. FINDINGS: BONES AND JOINTS: Acute comminuted and displaced fracture of the anterior process of the calcaneus. Acute minimally displaced fracture of the base of the second metatarsal. Fracture not well visualized. Disruption of the subtalar joint. No acute medial, lateral, posterior malleolar fracture. No dislocation. SOFT TISSUES: Subcutaneous soft tissue edema. IMPRESSION: 1. Acute comminuted and displaced fracture of the anterior process of the calcaneus. 2. Acute minimally displaced fracture of the base of the second metatarsal, not well visualized. 3. Recommended dedicated 3-view left foot radiographs. Electronically signed by: Morgane Naveau MD 04/22/2024 07:35 PM EST RP Workstation: [id number]   CT CERVICAL SPINE WO CONTRAST Result Date: 04/22/2024 EXAM: CT CERVICAL SPINE WITHOUT CONTRAST 04/22/2024 06:58:00 PM TECHNIQUE: CT of the cervical spine was performed without the administration of intravenous contrast. Multiplanar reformatted images are provided for review. Automated exposure control, iterative reconstruction, and/or weight based adjustment of the mA/kV was utilized to reduce the radiation dose to as low as reasonably achievable. COMPARISON: None available. CLINICAL HISTORY: Polytrauma, blunt. FINDINGS: CERVICAL SPINE: BONES AND ALIGNMENT: Trace degenerative anterolisthesis of C7 on T1, likely related to facet arthrosis. No evidence of traumatic malalignment. No acute fracture. DEGENERATIVE CHANGES: Disc space narrowing most pronounced at C4-C5 and C5-C6. Degenerative endplate osteophytes and endplate irregularity particularly at C4-C5 and C5-C6. Facet arthrosis and uncovertebral hypertrophy at multiple levels. Foraminal stenosis at  multiple levels throughout the cervical spine, most pronounced on the right at C7-T1. There is no evidence of high grade osseous spinal canal stenosis. SOFT TISSUES: No prevertebral soft tissue swelling. Heterogeneous appearance of the thyroid with multiple nodules noted. The largest nodule in the left thyroid lobe measures approximately 1.3 cm in diameter. LUNGS: Biapical pleural scarring. IMPRESSION: 1. No acute abnormality of the cervical spine. 2. Multilevel degenerative changes as above. 3. Findings discussed with Dr. Paola at 7:10PM on 04/22/24. Electronically signed by: Donnice Mania MD 04/22/2024 07:21 PM EST RP Workstation: [id number]   CT CHEST ABDOMEN PELVIS W CONTRAST Result Date: 04/22/2024 EXAM: CT CHEST, ABDOMEN AND PELVIS WITH CONTRAST 04/22/2024 06:58:00 PM TECHNIQUE: CT of the chest, abdomen and pelvis was performed with the administration of 75 mL of iohexol  (OMNIPAQUE ) 350 MG/ML injection. Multiplanar reformatted images are provided for review. Automated exposure control, iterative reconstruction, and/or weight based adjustment of the mA/kV was utilized to reduce the radiation dose to as low as reasonably achievable. COMPARISON: None available. CLINICAL HISTORY: Polytrauma, blunt. FINDINGS: CHEST: MEDIASTINUM AND LYMPH NODES: Heart and pericardium are  unremarkable. The central airways are clear. No mediastinal, hilar or axillary lymphadenopathy. Left thyroid nodules, better evaluated on prior thyroid ultrasound, no follow-up recommended. LUNGS AND PLEURA: Trace left pleural effusion. Equivocal subsegmental filling defect in the right lower lobe pulmonary artery (image 30), although poorly evaluated. No focal consolidation or pulmonary edema. No pneumothorax. ABDOMEN AND PELVIS: LIVER: The liver is unremarkable. GALLBLADDER AND BILE DUCTS: Gallbladder is unremarkable. No biliary ductal dilatation. SPLEEN: Calcified splenic granulomas. PANCREAS: No acute abnormality. ADRENAL GLANDS: No acute  abnormality. KIDNEYS, URETERS AND BLADDER: No stones in the kidneys or ureters. No hydronephrosis. No perinephric or periureteral stranding. Urinary bladder is unremarkable. GI AND BOWEL: Moderate hiatal hernia. Colonic diverticulosis without acute diverticulitis. Stomach demonstrates no acute abnormality. There is no bowel obstruction. REPRODUCTIVE ORGANS: Status post hysterectomy. PERITONEUM AND RETROPERITONEUM: No ascites. No free air. VASCULATURE: Aorta is normal in caliber. Severe atherosclerotic plaque, including aortic atherosclerosis. Possible filling defect within left common femoral vein (image 106). ABDOMINAL AND PELVIS LYMPH NODES: No lymphadenopathy. BONES AND SOFT TISSUES: Right shoulder arthroplasty noted. Old left lower posterolateral rib fracture deformities. Mild degenerative changes of the thoracolumbar spine. No acute osseous abnormality. No focal soft tissue abnormality. IMPRESSION: 1. No acute traumatic injury to the chest, abdomen, or pelvis. 2. Possible deep venous thrombosis within the left common femoral vein, consider lower extremity Doppler for confirmation. 3. Possible subsegmental pulmonary embolus in the right lower lobe pulmonary artery, equivocal. In the absence of DVT, this would likely reflect artifact on this non-dedicated evaluation. 4. Trace left pleural effusion. 5. Critical Value/emergent results were called by telephone at the time of interpretation on 04/22/2024 at 1910 hrs to provider Dr Paola. Electronically signed by: Pinkie Pebbles MD 04/22/2024 07:14 PM EST RP Workstation: [id number]   CT Head Wo Contrast Result Date: 04/22/2024 EXAM: CT HEAD WITHOUT 04/22/2024 06:58:00 PM TECHNIQUE: CT of the head was performed without the administration of intravenous contrast. Automated exposure control, iterative reconstruction, and/or weight based adjustment of the mA/kV was utilized to reduce the radiation dose to as low as reasonably achievable. COMPARISON: None available.  CLINICAL HISTORY: Polytrauma, blunt, pregnant; LEVEL 1 BLUNT FINDINGS: BRAIN AND VENTRICLES: Acute subdural hematoma along the left cerebral convexity measuring up to 5 mm in maximum thickness. No significant associated mass effect. No midline shift. No evidence of parenchymal hemorrhage. No intraventricular hemorrhage. Chronic microvascular ischemic changes noted. Mild generalized volume loss. No evidence of acute infarct. No hydrocephalus. ORBITS: Bilateral lens replacement. SINUSES AND MASTOIDS: No acute abnormality. SOFT TISSUES AND SKULL: Atherosclerosis of the carotid siphons. No acute skull fracture. No acute soft tissue abnormality. IMPRESSION: 1. Acute left convexity subdural hematoma measuring up to 5 mm without significant mass effect or midline shift. 2. No parenchymal or intraventricular hemorrhage. 3. TBI risk stratification: moderate - mbig 2. 4. Findings discussed with Dr. Paola at 7:10 PM on 04/22/24. Electronically signed by: Donnice Mania MD 04/22/2024 07:13 PM EST RP Workstation: [id number]   DG Pelvis Portable Result Date: 04/22/2024 CLINICAL DATA:  Fall from 30 feet. EXAM: PORTABLE PELVIS 1-2 VIEWS COMPARISON:  None Available. FINDINGS: Minimal symmetric osteoarthritic change of the hips. No acute fracture of the hips or pelvis. Moderate degenerative change of the spine. IMPRESSION: 1. No acute findings. 2. Minimal symmetric osteoarthritic change of the hips. Electronically Signed   By: Toribio Agreste M.D.   On: 04/22/2024 18:50   DG Chest Port 1 View Result Date: 04/22/2024 CLINICAL DATA:  Trauma. EXAM: PORTABLE CHEST 1 VIEW COMPARISON:  None Available. FINDINGS:  No focal consolidation, pleural effusion or pneumothorax. Top-normal cardiac size. No acute osseous pathology. Right shoulder arthroplasty. IMPRESSION: No active disease. Electronically Signed   By: Vanetta Chou M.D.   On: 04/22/2024 18:50     Procedures   Medications Ordered in the ED  acetaminophen  (TYLENOL ) tablet  1,000 mg (1,000 mg Oral Given 04/22/24 2318)  oxyCODONE  (Oxy IR/ROXICODONE ) immediate release tablet 2.5 mg (has no administration in time range)  morphine  (PF) 2 MG/ML injection 1-2 mg (2 mg Intravenous Given 04/22/24 2346)  docusate sodium  (COLACE) capsule 100 mg (100 mg Oral Given 04/22/24 2318)  polyethylene glycol (MIRALAX  / GLYCOLAX ) packet 17 g (has no administration in time range)  ondansetron  (ZOFRAN -ODT) disintegrating tablet 4 mg ( Oral See Alternative 04/22/24 1933)    Or  ondansetron  (ZOFRAN ) injection 4 mg (4 mg Intravenous Given 04/22/24 1933)  metoprolol  tartrate (LOPRESSOR ) injection 5 mg (has no administration in time range)  hydrALAZINE  (APRESOLINE ) injection 10 mg (has no administration in time range)  lactated ringers  infusion ( Intravenous Rate/Dose Change 04/22/24 1954)  methocarbamol  (ROBAXIN ) tablet 1,000 mg (1,000 mg Oral Given 04/22/24 2318)  traMADol  (ULTRAM ) tablet 25-50 mg (has no administration in time range)  levETIRAcetam  (KEPPRA ) undiluted injection 500 mg (500 mg Intravenous Given 04/22/24 2109)  amLODipine  (NORVASC ) tablet 5 mg (has no administration in time range)  donepezil  (ARICEPT ) tablet 5 mg (has no administration in time range)  lamoTRIgine  (LAMICTAL ) tablet 75 mg (75 mg Oral Given 04/22/24 2318)  QUEtiapine  (SEROQUEL ) tablet 50 mg (50 mg Oral Given 04/22/24 2318)  sertraline  (ZOLOFT ) tablet 100 mg (has no administration in time range)  LORazepam  (ATIVAN ) injection 1 mg (has no administration in time range)  Chlorhexidine  Gluconate Cloth 2 % PADS 6 each (has no administration in time range)  Oral care mouth rinse (has no administration in time range)  ceFAZolin  (ANCEF ) IVPB 2g/100 mL premix (0 g Intravenous Stopped 04/22/24 1908)  Tdap (ADACEL) injection 0.5 mL (0.5 mLs Intramuscular Given 04/22/24 1845)  fentaNYL  (SUBLIMAZE ) injection 25 mcg (25 mcg Intravenous Given 04/22/24 1850)  metoprolol  tartrate (LOPRESSOR ) injection 5 mg (5 mg Intravenous  Given 04/22/24 1859)  iohexol  (OMNIPAQUE ) 350 MG/ML injection 75 mL (75 mLs Intravenous Contrast Given 04/22/24 1859)    Clinical Course as of 04/23/24 0001  Thu Apr 22, 2024  1930 Both lower extremities have been splinted, ortho called, pending their return call  [BS]    Clinical Course User Index [BS] Arlee Katz, MD                                 Medical Decision Making Risk Decision regarding hospitalization.   Based on patient presentation, history, evaluation, high suspicion for open tib-fib fracture to right ankle, as well as closed tib-fib fracture to the left ankle, secondary to significant fall.  There is also evidence of a 5 mm SDH, mbig2.  There is also evidence of DVT, possible PE, however patient with new SDH, would not be able to receive heparin  at this time.  Discussed with orthopedic surgery team, plan for surgical intervention tomorrow.  Open fractures were splinted, and cleaned thoroughly at bedside.  Discussed with trauma team, agreeable to admission for all the above.  Hemodynamically stable at time of admission, continues to remain with significant dementia.     Final diagnoses:  Fall, initial encounter  Type I or II open displaced pilon fracture of right tibia, initial encounter  Type  I or II open fracture of distal end of right fibula, unspecified fracture morphology, initial encounter  Tibia/fibula fracture, left, closed, initial encounter    ED Discharge Orders     None          Arlee Katz, MD 04/23/24 0001    Ellouise Fine K, DO 04/23/24 1510

## 2024-04-22 NOTE — Progress Notes (Signed)
°   04/22/24 2038  Spiritual Encounters  Type of Visit Follow up  Care provided to: Pt and family  Conversation partners present during encounter Nurse  Referral source Trauma page  Reason for visit Trauma  OnCall Visit Yes   Patient's spouse and daughter in law arrived. Accompanied family to waiting area while patient was being Xray. Provided support until the patient was available for visitors. Escorted family to patient's bedside. Advised patient and family that chaplain is available if further assistance is required/requested.

## 2024-04-22 NOTE — ED Triage Notes (Addendum)
 Pt BIB [facility] following a fall from a third story window. Patient trying to escape memory care [facility]). Roughly a 25-30 foot fall onto rocks and mulch. Bilateral open ankle fractures. EMS noted stable hips. Patient endorses lower back pain.    VS 154/84, 100 HR, 97% RA

## 2024-04-23 ENCOUNTER — Inpatient Hospital Stay (HOSPITAL_COMMUNITY): Admitting: Anesthesiology

## 2024-04-23 ENCOUNTER — Other Ambulatory Visit: Payer: Self-pay

## 2024-04-23 ENCOUNTER — Inpatient Hospital Stay (HOSPITAL_COMMUNITY)

## 2024-04-23 ENCOUNTER — Encounter (HOSPITAL_COMMUNITY): Payer: Self-pay

## 2024-04-23 ENCOUNTER — Encounter (HOSPITAL_COMMUNITY)
Admission: EM | Disposition: A | Discharge status: Skilled Nursing Facility | Payer: Self-pay | Source: Skilled Nursing Facility

## 2024-04-23 DIAGNOSIS — S92001B Unspecified fracture of right calcaneus, initial encounter for open fracture: Secondary | ICD-10-CM

## 2024-04-23 DIAGNOSIS — S92001D Unspecified fracture of right calcaneus, subsequent encounter for fracture with routine healing: Secondary | ICD-10-CM | POA: Diagnosis not present

## 2024-04-23 DIAGNOSIS — S92002A Unspecified fracture of left calcaneus, initial encounter for closed fracture: Secondary | ICD-10-CM

## 2024-04-23 HISTORY — PX: IRRIGATION AND DEBRIDEMENT FOOT: SHX6602

## 2024-04-23 HISTORY — PX: ORIF CALCANEOUS FRACTURE: SHX5030

## 2024-04-23 HISTORY — PX: PERCUTANEOUS PINNING: SHX2209

## 2024-04-23 LAB — CBC
HCT: 32.5 % — ABNORMAL LOW (ref 36.0–46.0)
Hemoglobin: 10.9 g/dL — ABNORMAL LOW (ref 12.0–15.0)
MCH: 31.4 pg (ref 26.0–34.0)
MCHC: 33.5 g/dL (ref 30.0–36.0)
MCV: 93.7 fL (ref 80.0–100.0)
Platelets: 136 K/uL — ABNORMAL LOW (ref 150–400)
RBC: 3.47 MIL/uL — ABNORMAL LOW (ref 3.87–5.11)
RDW: 13.2 % (ref 11.5–15.5)
WBC: 10.7 K/uL — ABNORMAL HIGH (ref 4.0–10.5)
nRBC: 0 % (ref 0.0–0.2)

## 2024-04-23 LAB — BASIC METABOLIC PANEL WITH GFR
Anion gap: 8 (ref 5–15)
BUN: 13 mg/dL (ref 8–23)
CO2: 23 mmol/L (ref 22–32)
Calcium: 8.2 mg/dL — ABNORMAL LOW (ref 8.9–10.3)
Chloride: 106 mmol/L (ref 98–111)
Creatinine, Ser: 1.05 mg/dL — ABNORMAL HIGH (ref 0.44–1.00)
GFR, Estimated: 53 mL/min — ABNORMAL LOW
Glucose, Bld: 129 mg/dL — ABNORMAL HIGH (ref 70–99)
Potassium: 3.8 mmol/L (ref 3.5–5.1)
Sodium: 137 mmol/L (ref 135–145)

## 2024-04-23 LAB — MRSA NEXT GEN BY PCR, NASAL: MRSA by PCR Next Gen: NOT DETECTED

## 2024-04-23 LAB — VITAMIN D 25 HYDROXY (VIT D DEFICIENCY, FRACTURES): Vit D, 25-Hydroxy: 31.65 ng/mL (ref 30–100)

## 2024-04-23 LAB — SURGICAL PCR SCREEN
MRSA, PCR: NEGATIVE
Staphylococcus aureus: NEGATIVE

## 2024-04-23 SURGERY — OPEN REDUCTION INTERNAL FIXATION (ORIF) CALCANEOUS FRACTURE
Anesthesia: General | Site: Foot | Laterality: Right

## 2024-04-23 MED ORDER — VANCOMYCIN HCL 1000 MG IV SOLR
INTRAVENOUS | Status: DC | PRN
  Administered 2024-04-23: 1000 mg

## 2024-04-23 MED ORDER — SODIUM CHLORIDE 0.9 % IV BOLUS
500.0000 mL | Freq: Once | INTRAVENOUS | Status: AC
  Administered 2024-04-23: 500 mL via INTRAVENOUS

## 2024-04-23 MED ORDER — CHLORHEXIDINE GLUCONATE 0.12 % MT SOLN: 15.0000 mL | Freq: Once | OROMUCOSAL | Status: AC

## 2024-04-23 MED ORDER — TAMSULOSIN HCL 0.4 MG PO CAPS
0.4000 mg | ORAL_CAPSULE | Freq: Every day | ORAL | Status: DC
  Administered 2024-04-24 – 2024-05-18 (×23): 0.4 mg via ORAL
  Filled 2024-04-23 (×25): qty 1

## 2024-04-23 MED ORDER — CHLORHEXIDINE GLUCONATE 0.12 % MT SOLN
OROMUCOSAL | Status: AC
  Administered 2024-04-23: 15 mL via OROMUCOSAL
  Filled 2024-04-23: qty 15

## 2024-04-23 MED ORDER — METOCLOPRAMIDE HCL 5 MG/ML IJ SOLN: 5.0000 mg | Freq: Three times a day (TID) | INTRAMUSCULAR | Status: DC | PRN

## 2024-04-23 MED ORDER — ORAL CARE MOUTH RINSE: 15.0000 mL | Freq: Once | OROMUCOSAL | Status: AC

## 2024-04-23 MED ORDER — ACETAMINOPHEN 500 MG PO TABS: 1000.0000 mg | ORAL_TABLET | Freq: Once | ORAL | Status: DC

## 2024-04-23 MED ADMIN — Sodium Chloride Irrigation Soln 0.9%: 3000 mL | NDC 00338004724

## 2024-04-23 MED ADMIN — Levetiracetam Inj 500 MG/5ML (100 MG/ML): 500 mg | INTRAVENOUS | NDC 00409188622

## 2024-04-23 MED ADMIN — Quetiapine Fumarate Tab 50 MG: 50 mg | ORAL | NDC 67877024901

## 2024-04-23 MED ADMIN — Cefazolin Sodium-Dextrose IV Solution 2 GM/100ML-4%: 2 g | INTRAVENOUS | NDC 00338350841

## 2024-04-23 MED ADMIN — Lorazepam Inj 2 MG/ML: 1 mg | INTRAVENOUS | NDC 65219036801

## 2024-04-23 MED ADMIN — Fentanyl Citrate Preservative Free (PF) Inj 250 MCG/5ML: 50 ug | INTRAVENOUS | NDC 72572017125

## 2024-04-23 MED ADMIN — Fentanyl Citrate Preservative Free (PF) Inj 100 MCG/2ML: 25 ug | INTRAVENOUS | NDC 72572017001

## 2024-04-23 MED ADMIN — Mupirocin Oint 2%: 1 | NASAL | NDC 68462018022

## 2024-04-23 MED ADMIN — Ondansetron HCl Inj 4 MG/2ML (2 MG/ML): 4 mg | INTRAVENOUS | NDC 60505613005

## 2024-04-23 MED ADMIN — Ephedrine Sulf-NaCl Soln Pref Syr 50 MG/10ML-0.9% (5 MG/ML): 5 mg | INTRAVENOUS | NDC 99999070054

## 2024-04-23 MED ADMIN — henylephrine-NaCl Pref Syr 0.8 MG/10ML-0.9% (80 MCG/ML): 80 ug | INTRAVENOUS | NDC 65302050510

## 2024-04-23 MED ADMIN — henylephrine-NaCl Pref Syr 0.8 MG/10ML-0.9% (80 MCG/ML): 160 ug | INTRAVENOUS | NDC 65302050510

## 2024-04-23 MED ADMIN — Sodium Chloride Irrigation Soln 0.9%: 1000 mL | NDC 99999050048

## 2024-04-23 MED ADMIN — Lidocaine HCl Local Soln Prefilled Syringe 100 MG/5ML (2%): 60 mg | INTRAVENOUS | NDC 70004072309

## 2024-04-23 MED ADMIN — PROPOFOL 200 MG/20ML IV EMUL: 100 mg | INTRAVENOUS | NDC 00069020910

## 2024-04-23 MED ADMIN — Phenylephrine-NaCl IV Solution 20 MG/250ML-0.9%: 50 ug/min | INTRAVENOUS | NDC 70004080840

## 2024-04-23 MED FILL — Mupirocin Oint 2%: 1.0000 | CUTANEOUS | Qty: 22 | Status: AC

## 2024-04-23 MED FILL — Cefazolin Sodium-Dextrose IV Solution 2 GM/100ML-4%: 2.0000 g | INTRAVENOUS | Qty: 100 | Status: AC

## 2024-04-23 MED FILL — Ondansetron HCl Inj 4 MG/2ML (2 MG/ML): INTRAMUSCULAR | Qty: 2 | Status: AC

## 2024-04-23 MED FILL — henylephrine-NaCl Pref Syr 0.8 MG/10ML-0.9% (80 MCG/ML): INTRAVENOUS | Qty: 10 | Status: AC

## 2024-04-23 MED FILL — Vancomycin HCl For IV Soln 1 GM (Base Equivalent): INTRAVENOUS | Qty: 40 | Status: AC

## 2024-04-23 MED FILL — Propofol IV Emul 200 MG/20ML (10 MG/ML): INTRAVENOUS | Qty: 20 | Status: AC

## 2024-04-23 MED FILL — Cefazolin Sodium-Dextrose IV Solution 2 GM/100ML-4%: INTRAVENOUS | Qty: 100 | Status: AC

## 2024-04-23 MED FILL — Fentanyl Citrate Preservative Free (PF) Inj 100 MCG/2ML: INTRAMUSCULAR | Qty: 2 | Status: AC

## 2024-04-23 MED FILL — Ephedrine Sulf-NaCl PF Pref Syr 50 MG/10ML-0.9% (5 MG/ML): INTRAVENOUS | Qty: 5 | Status: AC

## 2024-04-23 MED FILL — Lidocaine HCl Local Soln Prefilled Syringe 100 MG/5ML (2%): INTRAMUSCULAR | Qty: 5 | Status: AC

## 2024-04-23 SURGICAL SUPPLY — 56 items
BAG COUNTER SPONGE SURGICOUNT (BAG) ×2 IMPLANT
BIT DRILL 4.8X200 CANN (BIT) IMPLANT
BLADE SURG 10 STRL SS (BLADE) ×2 IMPLANT
BNDG COHESIVE 4X5 TAN STRL LF (GAUZE/BANDAGES/DRESSINGS) ×2 IMPLANT
BNDG COMPR ESMARK 6X3 LF (GAUZE/BANDAGES/DRESSINGS) ×2 IMPLANT
BNDG ELASTIC 4INX 5YD STR LF (GAUZE/BANDAGES/DRESSINGS) IMPLANT
BNDG ELASTIC 4X5.8 VLCR STR LF (GAUZE/BANDAGES/DRESSINGS) ×2 IMPLANT
BNDG ELASTIC 6INX 5YD STR LF (GAUZE/BANDAGES/DRESSINGS) ×2 IMPLANT
BRUSH SCRUB EZ PLAIN DRY (MISCELLANEOUS) ×4 IMPLANT
CANISTER WOUNDNEG PRESSURE 500 (CANNISTER) IMPLANT
CHLORAPREP W/TINT 26 (MISCELLANEOUS) ×2 IMPLANT
CONNECTOR 5 IN 1 STRAIGHT STRL (MISCELLANEOUS) ×2 IMPLANT
COVER MAYO STAND STRL (DRAPES) ×2 IMPLANT
COVER SURGICAL LIGHT HANDLE (MISCELLANEOUS) ×4 IMPLANT
DRAPE BILATERAL LIMB T (DRAPES) IMPLANT
DRAPE C-ARM 42X72 X-RAY (DRAPES) ×2 IMPLANT
DRAPE C-ARMOR (DRAPES) ×2 IMPLANT
DRAPE INCISE IOBAN 66X45 STRL (DRAPES) ×2 IMPLANT
DRAPE SURG ORHT 6 SPLT 77X108 (DRAPES) ×4 IMPLANT
DRAPE U-SHAPE 47X51 STRL (DRAPES) ×2 IMPLANT
DRESSING PEEL AND PLC PRVNA 13 (GAUZE/BANDAGES/DRESSINGS) IMPLANT
DRSG EMULSION OIL 3X3 NADH (GAUZE/BANDAGES/DRESSINGS) ×2 IMPLANT
DRSG MEPITEL 4X7.2 (GAUZE/BANDAGES/DRESSINGS) IMPLANT
ELECTRODE REM PT RTRN 9FT ADLT (ELECTROSURGICAL) ×2 IMPLANT
GAUZE SPONGE 4X4 12PLY STRL (GAUZE/BANDAGES/DRESSINGS) ×2 IMPLANT
GLOVE BIO SURGEON STRL SZ 6.5 (GLOVE) ×6 IMPLANT
GLOVE BIO SURGEON STRL SZ7.5 (GLOVE) ×8 IMPLANT
GLOVE BIOGEL PI IND STRL 6.5 (GLOVE) ×2 IMPLANT
GLOVE BIOGEL PI IND STRL 7.5 (GLOVE) ×2 IMPLANT
GOWN STRL REUS W/ TWL LRG LVL3 (GOWN DISPOSABLE) ×4 IMPLANT
KIT BASIN OR (CUSTOM PROCEDURE TRAY) ×2 IMPLANT
KIT TURNOVER KIT B (KITS) ×2 IMPLANT
MANIFOLD NEPTUNE II (INSTRUMENTS) ×2 IMPLANT
NEEDLE HYPO 21X1.5 SAFETY (NEEDLE) IMPLANT
PACK ORTHO EXTREMITY (CUSTOM PROCEDURE TRAY) ×2 IMPLANT
PAD ARMBOARD POSITIONER FOAM (MISCELLANEOUS) ×4 IMPLANT
PAD CAST 4YDX4 CTTN HI CHSV (CAST SUPPLIES) ×4 IMPLANT
PAD CAST CTTN 4X4 STRL (SOFTGOODS) IMPLANT
PADDING CAST COTTON 6X4 STRL (CAST SUPPLIES) ×2 IMPLANT
PIN GUIDE DRILL TIP 2.8X300 (DRILL) IMPLANT
SCREW CANNFT 6.5X60 (Screw) IMPLANT
SCREW FULL THREAD 6.5X65MM (Screw) IMPLANT
SCREW SHANZ 4.0X60MM (EXFIX) IMPLANT
SOLN 0.9% NACL POUR BTL 1000ML (IV SOLUTION) ×2 IMPLANT
SOLN STERILE WATER BTL 1000 ML (IV SOLUTION) ×2 IMPLANT
SPONGE T-LAP 18X18 ~~LOC~~+RFID (SPONGE) IMPLANT
SUCTION TUBE FRAZIER 10FR DISP (SUCTIONS) ×2 IMPLANT
SUT ETHILON 3 0 PS 1 (SUTURE) ×4 IMPLANT
SUT MON AB 2-0 CT1 36 (SUTURE) IMPLANT
SUT VIC AB 0 CT1 27XBRD ANBCTR (SUTURE) ×2 IMPLANT
SUT VIC AB 2-0 CT1 TAPERPNT 27 (SUTURE) IMPLANT
SYR CONTROL 10ML LL (SYRINGE) IMPLANT
TOWEL GREEN STERILE (TOWEL DISPOSABLE) ×4 IMPLANT
TOWEL GREEN STERILE FF (TOWEL DISPOSABLE) ×2 IMPLANT
TUBE CONNECTING 12X1/4 (SUCTIONS) ×4 IMPLANT
UNDERPAD 30X36 HEAVY ABSORB (UNDERPADS AND DIAPERS) ×2 IMPLANT

## 2024-04-23 NOTE — Transfer of Care (Signed)
 Immediate Anesthesia Transfer of Care Note  Patient: Larissa Pegg Deblois  Procedure(s) Performed: OPEN REDUCTION INTERNAL FIXATION (ORIF) CALCANEOUS FRACTURE (Right: Foot) PINNING, EXTREMITY, PERCUTANEOUS (Left: Foot) IRRIGATION AND DEBRIDEMENT FOOT (Right: Foot)  Patient Location: PACU  Anesthesia Type:General  Level of Consciousness: drowsy  Airway & Oxygen Therapy: Patient Spontanous Breathing and Patient connected to nasal cannula oxygen  Post-op Assessment: Report given to RN and Post -op Vital signs reviewed and stable  Post vital signs: Reviewed and stable  Last Vitals:  Vitals Value Taken Time  BP 115/60 04/23/24 13:45  Temp 36.1 C 04/23/24 13:42  Pulse 80 04/23/24 13:46  Resp 15 04/23/24 13:46  SpO2 100 % 04/23/24 13:46  Vitals shown include unfiled device data.  Last Pain:  Vitals:   04/23/24 1342  TempSrc:   PainSc: 0-No pain         Complications: No notable events documented.

## 2024-04-23 NOTE — Op Note (Signed)
 Orthopaedic Surgery Operative Note (CSN: [id number] ) Date of Surgery: 04/23/2024  Admit Date: 04/22/2024   Diagnoses: Pre-Op Diagnoses: Left closed comminuted calcaneus fracture Left talonavicular joint subluxation Left navicular fracture Right type IIIA open calcaneus fracture Right open subtalar joint dislocation Right distal fibula fracture Right third metatarsal fracture Right nondisplaced cuboid fracture  Post-Op Diagnosis: Same  Procedures: CPT 28415-Open reduction internal fixation of right calcaneus fracture CPT 28585-Open reduction of right subtalar joint dislocation CPT 11012-Irrigation and debridement of right open calcaneus fracture CPT 28405-Closed treatment of left calcaneus fracture CPT 27788-Closed treatment of right lateral malleolus fracture CPT 28576-Closed reduction and percutaneous fixation of left talonavicular joint subluxation CPT 28456-Percutaneous fixation of left navicular fracture  Surgeons : Primary: Kendal Franky SQUIBB, MD  Assistant: Lauraine Moores, PA-C  Location: OR 3   Anesthesia: General   Antibiotics: Ancef  2g preop with 1 gm vancomycin  powder placed topically   Tourniquet time: None    Estimated Blood Loss: 50 mL  Complications:* No complications entered in OR log *   Specimens:* No specimens in log *   Implants: Implant Name Type Inv. Item Serial No. Manufacturer Lot No. LRB No. Used Action  SCREW CANNFT 6.5X60 - [id number] Screw SCREW CANNFT 6.5X60  [employer](ORTH,TRAU,BIO,SG)  Right 1 Implanted  SCREW FULL THREAD 6.5X65MM - [id number] Screw SCREW FULL THREAD 6.5X65MM  [employer](ORTH,TRAU,BIO,SG)  Right 1 Implanted     Indications for Surgery: 83 year old female who fell from a height sustaining a bilateral calcaneus fractures.  The right side was open.  She was admitted.  Was consulted for the complexity of her injury.  She was indicated for irrigation debridement with fixation of the right calcaneus and likely percutaneous  fixation of her talonavicular joint.  Her left calcaneus was well aligned and can be treated nonoperatively.  Risks and benefits were discussed with the patient and her family.  Risks include but not limited to bleeding, infection, malunion, nonunion, hardware failure, hardware rotation, nerve and blood vessel injury, posttraumatic arthritis, ankle stiffness, even possibility anesthetic complications.  They agreed to proceed with surgery and consent was obtained.  Operative Findings: 1.  Closed reduction percutaneous fixation of left talonavicular joint with 1.6 mm K wires x 2. 2.  Percutaneous fixation of left navicular fracture with nonoperative management of the left calcaneus fracture. 3.  Irrigation and debridement of left open type IIIa calcaneus fracture with open reduction of subtalar joint and fixation of calcaneus and subtalar joint using [employer] fully threaded 6.5 mm cannulated screws 4.  Nonoperative management of right distal fibula fracture and right cuboid fracture.  Procedure: The patient was identified in the preoperative holding area. Consent was confirmed with the patient and their family and all questions were answered. The operative extremity was marked after confirmation with the patient. she was then brought back to the operating room by our anesthesia colleagues.  She was placed under general anesthetic and carefully transferred over to a radiolucent flattop table.  Bilateral lower extremities were prepped and draped in usual sterile fashion.  A timeout was performed to verify the patient, the procedure, and the extremity.  Preoperative antibiotics were dosed.  She is for started out with the left lower extremity.  Fluoroscopic imaging showed that the calcaneus was relatively well aligned and can be treated nonoperatively.  In regards to the talonavicular joint there was some widening with ankle plantarflexion through that joint.  I was able to dorsiflex the foot aligned  appropriately on fluoroscopic imaging and placed 1.6 mm  K wires across the navicular and across the talonavicular joint.  This stabilized this to provide stability to the foot.  No further intervention was warranted on the left lower extremity.  We then turned our attention to the right lower extremity.  There was a vertical 7 cm laceration over the medial aspect.  There was some bleeding from the wound but the posterior tibial artery was palpable.  I used low-pressure pulsatile lavage and irrigation to thoroughly irrigate the wound with approximately 3 L normal saline.  There was no gross contamination.  I did remove some bony fragments.  I changed gloves and instruments and then turned my attention to fixation.  I was able to use a Cobb elevator to go under the sustentaculum tali to get into the tuberosity and was able to manipulate the tuberosity out of a lateral translated position and reduced underneath the sustentaculum and the subtalar joint.  I was able to reduce the calcaneus under the subtalar joint as well.  Once I was able to palpate the medial cortical read I then percutaneously placed guidewires for the 6.5 mm fully threaded cannulated screws.  I was able to place him through the tuberosity crossing the fracture and crossing the subtalar joint into the talar dome.  At this point I then measured the length and percutaneously placed a 6.5 mm fully threaded screws.  Excellent fixation was obtained.  The remainder of the fractures in the right lower extremity were stable and did not need any further intervention including the distal fibula and cuboid.  A gram of vancomycin  powder was placed into the wound.  Through the wound was closed with 3-0 nylon.  A Prevena wound VAC was placed.  The patient was then placed in sterile dressings.  She was then awoke from anesthesia and taken to the PACU in stable condition.   Debridement type: Excisional Debridement  Side: right  Body Location:  Calcaneus  Tools used for debridement: scalpel, curette, and rongeur  Pre-debridement Wound size (cm):   Length: 7        Width: 3     Depth: 2   Post-debridement Wound size (cm):  N/A-closed  Debridement depth beyond dead/damaged tissue down to healthy viable tissue: yes  Tissue layer involved: skin, subcutaneous tissue, muscle / fascia, bone  Nature of tissue removed: Devitalized Tissue  Irrigation volume: 3L     Irrigation fluid type: Normal Saline   Post Op Plan/Instructions: The patient will be nonweightbearing to bilateral lower extremities.  She will receive postoperative Ancef .  She will receive Lovenox for DVT prophylaxis and discharged on an oral DOAC.  Will have her mobilize with physical and Occupational Therapy.  I was present and performed the entire surgery.  Lauraine Moores, PA-C did assist me throughout the case. An assistant was necessary given the difficulty in approach, maintenance of reduction and ability to instrument the fracture.   Franky Light, MD [facility]

## 2024-04-23 NOTE — Progress Notes (Signed)
 PT Cancellation Note  Patient Details Name: Shirley Woods MRN: [id number] DOB: 08/27/1940   Cancelled Treatment:    Reason Eval/Treat Not Completed: Patient at procedure or test/unavailable.  Pt in the OR presently.  Will likely not be able to see her today.  But will check and see as appropriate and able. 04/23/2024  India HERO., PT Acute Rehabilitation Services [phone]  (office)04/23/2024  India HERO., PT Acute Rehabilitation Services [phone]  (office)   Shirley Woods Agueda Houpt 04/23/2024, 10:08 AM

## 2024-04-23 NOTE — H&P (View-Only) (Signed)
 Orthopaedic Trauma Service (OTS) Consult   Patient ID: Shirley Woods MRN: [id number] DOB/AGE: [birth year] 83 y.o.  Reason for Consult:Bilateral calcaneus fracture Referring Physician: Dr. Redell Shoals, MD Shirley Woods  HPI: Shirley Woods is a 83 y.o. female who according to report was trying to escape primary care facility and climbed to the third floor and jumped off.  She landed on her legs had immediate pain and deformity.  Was also noted to find and have a subdural hematoma.  Initially Dr. Shoals was consulted and requested orthopedic trauma assistance due to the complexity of her injuries.  Patient was sleeping today.  She does have a history of dementia.  Patient was ambulatory prior to this.  I discussed over the phone with her daughter who is the healthcare power of attorney.  No past medical history on file.  No family history on file.  Social History:  has no history on file for tobacco use, alcohol use, and drug use.  Allergies: Allergies[1]  Medications: Medications Ordered Prior to Encounter[2]   ROS: Unable to obtain  Exam: Blood pressure (!) 89/63, pulse 84, temperature (!) 97.3 F (36.3 C), temperature source Axillary, resp. rate 14, height 5' 2 (1.575 m), weight 68 kg, SpO2 100%. General: Sleeping Orientation: Does not awake Mood and Affect: Unable to obtain Gait: Unable to assess Coordination and balance: Unable to assess  Right lower extremity: Bulky splint is in place and does have some serosanguineous strikethrough.  She has good palpable pulse on her DP.  She has brisk cap refill.  She does not cooperate with neuroexam.  Left lower extremity: Bulky splint is in place that is clean dry and intact.  Warm well-perfused foot with palpable DP pulse.  Does not cooperate with neuroexam.  Medical Decision Making: Data: Imaging: X-rays and CT scan of the right ankle shows a comminuted calcaneus fracture with significant displacement and involvement of  the subtalar joint.  There is also a distal fibula avulsion fracture.  Significant shortening of the calcaneus  X-rays and CT scan of the left ankle and foot are reviewed which shows a calcaneus fracture that has mild displacement and minimal displacement of the subtalar joint.  There is significant comminution of the anterior process of the calcaneus as well as subluxation of the talonavicular joint.  There is also a medial navicular fracture and fracture of the base of the third metatarsal and cuboid  Labs:  Results for orders placed or performed during the hospital encounter of 04/22/24 (from the past 24 hours)  Sample to Blood Bank     Status: None   Collection Time: 04/22/24  6:38 PM  Result Value Ref Range   Blood Bank Specimen SAMPLE AVAILABLE FOR TESTING    Sample Expiration      04/25/2024,2359 Performed at [facility], [address] [place], [place] [address]   Comprehensive metabolic panel     Status: Abnormal   Collection Time: 04/22/24  6:40 PM  Result Value Ref Range   Sodium 137 135 - 145 mmol/L   Potassium 3.6 3.5 - 5.1 mmol/L   Chloride 103 98 - 111 mmol/L   CO2 22 22 - 32 mmol/L   Glucose, Bld 151 (H) 70 - 99 mg/dL   BUN 11 8 - 23 mg/dL   Creatinine, Ser 8.97 (H) 0.44 - 1.00 mg/dL   Calcium 8.9 8.9 - 89.6 mg/dL   Total Protein 6.8 6.5 - 8.1 g/dL   Albumin 3.8 3.5 - 5.0 g/dL  AST 48 (H) 15 - 41 U/L   ALT 34 0 - 44 U/L   Alkaline Phosphatase 73 38 - 126 U/L   Total Bilirubin 0.8 0.0 - 1.2 mg/dL   GFR, Estimated 55 (L) >60 mL/min   Anion gap 12 5 - 15  CBC     Status: None   Collection Time: 04/22/24  6:40 PM  Result Value Ref Range   WBC 6.8 4.0 - 10.5 K/uL   RBC 4.40 3.87 - 5.11 MIL/uL   Hemoglobin 14.1 12.0 - 15.0 g/dL   HCT 57.7 63.9 - 53.9 %   MCV 95.9 80.0 - 100.0 fL   MCH 32.0 26.0 - 34.0 pg   MCHC 33.4 30.0 - 36.0 g/dL   RDW 87.1 88.4 - 84.4 %   Platelets 164 150 - 400 K/uL   nRBC 0.0 0.0 - 0.2 %  Ethanol     Status: None   Collection  Time: 04/22/24  6:40 PM  Result Value Ref Range   Alcohol, Ethyl (B) <15 <15 mg/dL  Protime-INR     Status: None   Collection Time: 04/22/24  6:40 PM  Result Value Ref Range   Prothrombin Time 14.3 11.4 - 15.2 seconds   INR 1.1 0.8 - 1.2  I-Stat Chem 8, ED     Status: Abnormal   Collection Time: 04/22/24  6:45 PM  Result Value Ref Range   Sodium 140 135 - 145 mmol/L   Potassium 3.5 3.5 - 5.1 mmol/L   Chloride 103 98 - 111 mmol/L   BUN 11 8 - 23 mg/dL   Creatinine, Ser 8.99 0.44 - 1.00 mg/dL   Glucose, Bld 852 (H) 70 - 99 mg/dL   Calcium, Ion 8.87 (L) 1.15 - 1.40 mmol/L   TCO2 22 22 - 32 mmol/L   Hemoglobin 13.9 12.0 - 15.0 g/dL   HCT 58.9 63.9 - 53.9 %  I-Stat Lactic Acid, ED     Status: Abnormal   Collection Time: 04/22/24  6:45 PM  Result Value Ref Range   Lactic Acid, Venous 2.4 (HH) 0.5 - 1.9 mmol/L   Comment NOTIFIED PHYSICIAN   Urinalysis, Routine w reflex microscopic -Urine, Clean Catch     Status: Abnormal   Collection Time: 04/22/24  7:41 PM  Result Value Ref Range   Color, Urine STRAW (A) YELLOW   APPearance CLEAR CLEAR   Specific Gravity, Urine 1.019 1.005 - 1.030   pH 7.0 5.0 - 8.0   Glucose, UA 150 (A) NEGATIVE mg/dL   Hgb urine dipstick SMALL (A) NEGATIVE   Bilirubin Urine NEGATIVE NEGATIVE   Ketones, ur 5 (A) NEGATIVE mg/dL   Protein, ur 30 (A) NEGATIVE mg/dL   Nitrite NEGATIVE NEGATIVE   Leukocytes,Ua NEGATIVE NEGATIVE   RBC / HPF 0-5 0 - 5 RBC/hpf   WBC, UA 0-5 0 - 5 WBC/hpf   Bacteria, UA RARE (A) NONE SEEN   Squamous Epithelial / HPF 0-5 0 - 5 /HPF  MRSA Next Gen by PCR, Nasal     Status: None   Collection Time: 04/22/24 11:24 PM   Specimen: Nasal Mucosa; Nasal Swab  Result Value Ref Range   MRSA by PCR Next Gen NOT DETECTED NOT DETECTED  CBC     Status: Abnormal   Collection Time: 04/23/24  3:44 AM  Result Value Ref Range   WBC 10.7 (H) 4.0 - 10.5 K/uL   RBC 3.47 (L) 3.87 - 5.11 MIL/uL   Hemoglobin 10.9 (L) 12.0 - 15.0 g/dL  HCT 32.5 (L)  36.0 - 46.0 %   MCV 93.7 80.0 - 100.0 fL   MCH 31.4 26.0 - 34.0 pg   MCHC 33.5 30.0 - 36.0 g/dL   RDW 86.7 88.4 - 84.4 %   Platelets 136 (L) 150 - 400 K/uL   nRBC 0.0 0.0 - 0.2 %  Basic metabolic panel     Status: Abnormal   Collection Time: 04/23/24  3:44 AM  Result Value Ref Range   Sodium 137 135 - 145 mmol/L   Potassium 3.8 3.5 - 5.1 mmol/L   Chloride 106 98 - 111 mmol/L   CO2 23 22 - 32 mmol/L   Glucose, Bld 129 (H) 70 - 99 mg/dL   BUN 13 8 - 23 mg/dL   Creatinine, Ser 8.94 (H) 0.44 - 1.00 mg/dL   Calcium 8.2 (L) 8.9 - 10.3 mg/dL   GFR, Estimated 53 (L) >60 mL/min   Anion gap 8 5 - 15     Imaging or Labs ordered: None  Medical history and chart was reviewed and case discussed with medical provider.  Assessment/Plan: 83 year old female with high energy fall with a right open calcaneus fracture and a left calcaneus fracture with talonavicular joint subluxation/dislocation  We will plan to proceed to the operating room for irrigation debridement and percutaneous fixation of the calcaneus.  We also plan for close reduction and percutaneous fixation of the left talonavicular joint.  I discussed risks and benefits of surgical intervention with the patient's daughter over the phone.  Risks include but not limited to bleeding, infection, malunion, nonunion, hardware failure, hardware rotation, nerve and blood vessel injury, posttraumatic arthritis, DVT, and possible anesthetic complications.  She agreed to proceed with surgery and consent was obtained over the phone.  Surgery w/ risks or Emergency surgery: High complexity Risk (Level 5)  Franky MYRTIS Light, MD [facility] [phone] (office) [email or web address]       [1] No Known Allergies [2]  No current facility-administered medications on file prior to encounter.   Current Outpatient Medications on File Prior to Encounter  Medication Sig Dispense Refill   acetaminophen  (TYLENOL ) 500 MG tablet Take 500  mg by mouth every 6 (six) hours as needed for mild pain (pain score 1-3) or moderate pain (pain score 4-6).     amLODipine  (NORVASC ) 5 MG tablet Take 5 mg by mouth daily.     donepezil  (ARICEPT ) 5 MG tablet Take 5 mg by mouth every evening.     lamoTRIgine  (LAMICTAL ) 150 MG tablet Take 75 mg by mouth at bedtime.     LORazepam  (ATIVAN ) 0.5 MG tablet Take 0.5 mg by mouth every 6 (six) hours as needed for anxiety.     LORazepam  (ATIVAN ) 1 MG tablet Take 1.5 mg by mouth every evening.     QUEtiapine  (SEROQUEL ) 50 MG tablet Take 50 mg by mouth 2 (two) times daily.     sertraline  (ZOLOFT ) 100 MG tablet Take 150 mg by mouth every evening.     zinc gluconate 50 MG tablet Take 50 mg by mouth daily.

## 2024-04-23 NOTE — TOC Initial Note (Signed)
 Transition of Care [facility]) - Initial/Assessment Note    Patient Details  Name: Shirley Woods MRN: [id number] Date of Birth: 1940/12/31  [facility] of [facility]) CM/SW Contact:    Zelma Mazariego M, RN Phone Number: 04/23/2024, 4:00 PM  Clinical Narrative:                 Patient admitted from [facility] after jumping out of a second story window. She sustained a Rt ankle fx, bilat calcaneous fx, Lt 2nd metatarsal fx, and Lt SDH.   PTA, pt resided at [facility] and required assistance with ADLs.  Patient in OR today; await PT/OT evaluations to determine LOC needed at discharge.  Anticipate SNF.     Expected Discharge Plan: Skilled Nursing Facility Barriers to Discharge: Continued Medical Work up               Expected Discharge Plan and Services   Discharge Planning Services: CM Consult   Living arrangements for the past 2 months: Assisted Living Facility (Memory Care)                                      Prior Living Arrangements/Services Living arrangements for the past 2 months: Assisted Living Facility (Memory Care) Lives with:: Facility Resident Patient language and need for interpreter reviewed:: Yes        Need for Family Participation in Patient Care: Yes (Comment)     Criminal Activity/Legal Involvement Pertinent to Current Situation/Hospitalization: No - Comment as needed  Activities of Daily Living   ADL Screening (condition at time of admission) Independently performs ADLs?: No Does the patient have a NEW difficulty with bathing/dressing/toileting/self-feeding that is expected to last >3 days?: Yes (Initiates electronic notice to provider for possible OT consult) Does the patient have a NEW difficulty with getting in/out of bed, walking, or climbing stairs that is expected to last >3 days?: Yes (Initiates electronic notice to provider for possible PT consult) Does the patient have a NEW difficulty with communication that  is expected to last >3 days?: Yes (Initiates electronic notice to provider for possible SLP consult) Is the patient deaf or have difficulty hearing?: No Does the patient have difficulty seeing, even when wearing glasses/contacts?: No Does the patient have difficulty concentrating, remembering, or making decisions?: Yes  Permission Sought/Granted                  Emotional Assessment              Admission diagnosis:  Open ankle fracture [S82.899B] Patient Active Problem List   Diagnosis Date Noted   Open ankle fracture 04/22/2024   PCP:  Caleen Dirks, MD Pharmacy:  No Pharmacies Listed    Social Drivers of Health (SDOH) Social History: SDOH Screenings   Food Insecurity: No Food Insecurity (04/23/2024)  Housing: Low Risk (04/23/2024)  Transportation Needs: No Transportation Needs (04/23/2024)  Utilities: Not At Risk (04/23/2024)  Social Connections: Patient Unable To Answer (04/23/2024)   SDOH Interventions:     Readmission Risk Interventions     No data to display         Mliss MICAEL Fass, RN, BSN  Trauma/Neuro ICU Case Manager [phone]

## 2024-04-23 NOTE — Progress Notes (Signed)
 OT Cancellation Note  Patient Details Name: Shirley Woods MRN: [id number] DOB: Apr 29, 1941   Cancelled Treatment:    Reason Eval/Treat Not Completed: Patient at procedure or test/ unavailable--pt in OR.  Donny BECKER OT Acute Rehabilitation Services Office [phone]    [name] 04/23/2024, 9:47 AM

## 2024-04-23 NOTE — Consult Note (Signed)
 Orthopaedic Trauma Service (OTS) Consult   Patient ID: Shirley Woods MRN: [id number] DOB/AGE: [birth year] 83 y.o.  Reason for Consult:Bilateral calcaneus fracture Referring Physician: Dr. Redell Shoals, MD [place]  HPI: Shirley Woods is an 83 y.o. female who according to report was trying to escape primary care facility and climbed to the third floor and jumped off.  She landed on her legs had immediate pain and deformity.  Was also noted to find and have a subdural hematoma.  Initially Dr. Shoals was consulted and requested orthopedic trauma assistance due to the complexity of her injuries.  Patient was sleeping today.  She does have a history of dementia.  Patient was ambulatory prior to this.  I discussed over the phone with her daughter who is the healthcare power of attorney.  No past medical history on file.  No family history on file.  Social History:  has no history on file for tobacco use, alcohol use, and drug use.  Allergies: Allergies[1]  Medications: Medications Ordered Prior to Encounter[2]   ROS: Unable to obtain  Exam: Blood pressure (!) 89/63, pulse 84, temperature (!) 97.3 F (36.3 C), temperature source Axillary, resp. rate 14, height 5' 2 (1.575 m), weight 68 kg, SpO2 100%. General: Sleeping Orientation: Does not awake Mood and Affect: Unable to obtain Gait: Unable to assess Coordination and balance: Unable to assess  Right lower extremity: Bulky splint is in place and does have some serosanguineous strikethrough.  She has good palpable pulse on her DP.  She has brisk cap refill.  She does not cooperate with neuroexam.  Left lower extremity: Bulky splint is in place that is clean dry and intact.  Warm well-perfused foot with palpable DP pulse.  Does not cooperate with neuroexam.  Medical Decision Making: Data: Imaging: X-rays and CT scan of the right ankle shows a comminuted calcaneus fracture with significant displacement and involvement of  the subtalar joint.  There is also a distal fibula avulsion fracture.  Significant shortening of the calcaneus  X-rays and CT scan of the left ankle and foot are reviewed which shows a calcaneus fracture that has mild displacement and minimal displacement of the subtalar joint.  There is significant comminution of the anterior process of the calcaneus as well as subluxation of the talonavicular joint.  There is also a medial navicular fracture and fracture of the base of the third metatarsal and cuboid  Labs:  Results for orders placed or performed during the hospital encounter of 04/22/24 (from the past 24 hours)  Sample to Blood Bank     Status: None   Collection Time: 04/22/24  6:38 PM  Result Value Ref Range   Blood Bank Specimen SAMPLE AVAILABLE FOR TESTING    Sample Expiration      04/25/2024,2359 Performed at [facility], [address] [place], [place] [address]   Comprehensive metabolic panel     Status: Abnormal   Collection Time: 04/22/24  6:40 PM  Result Value Ref Range   Sodium 137 135 - 145 mmol/L   Potassium 3.6 3.5 - 5.1 mmol/L   Chloride 103 98 - 111 mmol/L   CO2 22 22 - 32 mmol/L   Glucose, Bld 151 (H) 70 - 99 mg/dL   BUN 11 8 - 23 mg/dL   Creatinine, Ser 8.97 (H) 0.44 - 1.00 mg/dL   Calcium 8.9 8.9 - 89.6 mg/dL   Total Protein 6.8 6.5 - 8.1 g/dL   Albumin 3.8 3.5 - 5.0 g/dL  AST 48 (H) 15 - 41 U/L   ALT 34 0 - 44 U/L   Alkaline Phosphatase 73 38 - 126 U/L   Total Bilirubin 0.8 0.0 - 1.2 mg/dL   GFR, Estimated 55 (L) >60 mL/min   Anion gap 12 5 - 15  CBC     Status: None   Collection Time: 04/22/24  6:40 PM  Result Value Ref Range   WBC 6.8 4.0 - 10.5 K/uL   RBC 4.40 3.87 - 5.11 MIL/uL   Hemoglobin 14.1 12.0 - 15.0 g/dL   HCT 57.7 63.9 - 53.9 %   MCV 95.9 80.0 - 100.0 fL   MCH 32.0 26.0 - 34.0 pg   MCHC 33.4 30.0 - 36.0 g/dL   RDW 87.1 88.4 - 84.4 %   Platelets 164 150 - 400 K/uL   nRBC 0.0 0.0 - 0.2 %  Ethanol     Status: None   Collection  Time: 04/22/24  6:40 PM  Result Value Ref Range   Alcohol, Ethyl (B) <15 <15 mg/dL  Protime-INR     Status: None   Collection Time: 04/22/24  6:40 PM  Result Value Ref Range   Prothrombin Time 14.3 11.4 - 15.2 seconds   INR 1.1 0.8 - 1.2  I-Stat Chem 8, ED     Status: Abnormal   Collection Time: 04/22/24  6:45 PM  Result Value Ref Range   Sodium 140 135 - 145 mmol/L   Potassium 3.5 3.5 - 5.1 mmol/L   Chloride 103 98 - 111 mmol/L   BUN 11 8 - 23 mg/dL   Creatinine, Ser 8.99 0.44 - 1.00 mg/dL   Glucose, Bld 852 (H) 70 - 99 mg/dL   Calcium, Ion 8.87 (L) 1.15 - 1.40 mmol/L   TCO2 22 22 - 32 mmol/L   Hemoglobin 13.9 12.0 - 15.0 g/dL   HCT 58.9 63.9 - 53.9 %  I-Stat Lactic Acid, ED     Status: Abnormal   Collection Time: 04/22/24  6:45 PM  Result Value Ref Range   Lactic Acid, Venous 2.4 (HH) 0.5 - 1.9 mmol/L   Comment NOTIFIED PHYSICIAN   Urinalysis, Routine w reflex microscopic -Urine, Clean Catch     Status: Abnormal   Collection Time: 04/22/24  7:41 PM  Result Value Ref Range   Color, Urine STRAW (A) YELLOW   APPearance CLEAR CLEAR   Specific Gravity, Urine 1.019 1.005 - 1.030   pH 7.0 5.0 - 8.0   Glucose, UA 150 (A) NEGATIVE mg/dL   Hgb urine dipstick SMALL (A) NEGATIVE   Bilirubin Urine NEGATIVE NEGATIVE   Ketones, ur 5 (A) NEGATIVE mg/dL   Protein, ur 30 (A) NEGATIVE mg/dL   Nitrite NEGATIVE NEGATIVE   Leukocytes,Ua NEGATIVE NEGATIVE   RBC / HPF 0-5 0 - 5 RBC/hpf   WBC, UA 0-5 0 - 5 WBC/hpf   Bacteria, UA RARE (A) NONE SEEN   Squamous Epithelial / HPF 0-5 0 - 5 /HPF  MRSA Next Gen by PCR, Nasal     Status: None   Collection Time: 04/22/24 11:24 PM   Specimen: Nasal Mucosa; Nasal Swab  Result Value Ref Range   MRSA by PCR Next Gen NOT DETECTED NOT DETECTED  CBC     Status: Abnormal   Collection Time: 04/23/24  3:44 AM  Result Value Ref Range   WBC 10.7 (H) 4.0 - 10.5 K/uL   RBC 3.47 (L) 3.87 - 5.11 MIL/uL   Hemoglobin 10.9 (L) 12.0 - 15.0 g/dL  HCT 32.5 (L)  36.0 - 46.0 %   MCV 93.7 80.0 - 100.0 fL   MCH 31.4 26.0 - 34.0 pg   MCHC 33.5 30.0 - 36.0 g/dL   RDW 86.7 88.4 - 84.4 %   Platelets 136 (L) 150 - 400 K/uL   nRBC 0.0 0.0 - 0.2 %  Basic metabolic panel     Status: Abnormal   Collection Time: 04/23/24  3:44 AM  Result Value Ref Range   Sodium 137 135 - 145 mmol/L   Potassium 3.8 3.5 - 5.1 mmol/L   Chloride 106 98 - 111 mmol/L   CO2 23 22 - 32 mmol/L   Glucose, Bld 129 (H) 70 - 99 mg/dL   BUN 13 8 - 23 mg/dL   Creatinine, Ser 8.94 (H) 0.44 - 1.00 mg/dL   Calcium 8.2 (L) 8.9 - 10.3 mg/dL   GFR, Estimated 53 (L) >60 mL/min   Anion gap 8 5 - 15     Imaging or Labs ordered: None  Medical history and chart was reviewed and case discussed with medical provider.  Assessment/Plan: 83 year old female with high energy fall with a right open calcaneus fracture and a left calcaneus fracture with talonavicular joint subluxation/dislocation  We will plan to proceed to the operating room for irrigation debridement and percutaneous fixation of the calcaneus.  We also plan for close reduction and percutaneous fixation of the left talonavicular joint.  I discussed risks and benefits of surgical intervention with the patient's daughter over the phone.  Risks include but not limited to bleeding, infection, malunion, nonunion, hardware failure, hardware rotation, nerve and blood vessel injury, posttraumatic arthritis, DVT, and possible anesthetic complications.  She agreed to proceed with surgery and consent was obtained over the phone.  Surgery w/ risks or Emergency surgery: High complexity Risk (Level 5)  Franky MYRTIS Light, MD [facility] [phone] (office) [email or web address]       [1] No Known Allergies [2]  No current facility-administered medications on file prior to encounter.   Current Outpatient Medications on File Prior to Encounter  Medication Sig Dispense Refill   acetaminophen  (TYLENOL ) 500 MG tablet Take 500  mg by mouth every 6 (six) hours as needed for mild pain (pain score 1-3) or moderate pain (pain score 4-6).     amLODipine  (NORVASC ) 5 MG tablet Take 5 mg by mouth daily.     donepezil  (ARICEPT ) 5 MG tablet Take 5 mg by mouth every evening.     lamoTRIgine  (LAMICTAL ) 150 MG tablet Take 75 mg by mouth at bedtime.     LORazepam  (ATIVAN ) 0.5 MG tablet Take 0.5 mg by mouth every 6 (six) hours as needed for anxiety.     LORazepam  (ATIVAN ) 1 MG tablet Take 1.5 mg by mouth every evening.     QUEtiapine  (SEROQUEL ) 50 MG tablet Take 50 mg by mouth 2 (two) times daily.     sertraline  (ZOLOFT ) 100 MG tablet Take 150 mg by mouth every evening.     zinc gluconate 50 MG tablet Take 50 mg by mouth daily.

## 2024-04-23 NOTE — Anesthesia Procedure Notes (Signed)
 Procedure Name: LMA Insertion Date/Time: 04/23/2024 12:22 PM  Performed by: Elby Raelene SAUNDERS, CRNAPre-anesthesia Checklist: Patient identified, Emergency Drugs available, Suction available and Patient being monitored Patient Re-evaluated:Patient Re-evaluated prior to induction Oxygen Delivery Method: Circle System Utilized Preoxygenation: Pre-oxygenation with 100% oxygen Induction Type: IV induction Ventilation: Mask ventilation without difficulty LMA: LMA inserted LMA Size: 4.0 Number of attempts: 1 Airway Equipment and Method: Bite block Placement Confirmation: positive ETCO2 Tube secured with: Tape Dental Injury: Teeth and Oropharynx as per pre-operative assessment

## 2024-04-23 NOTE — Progress Notes (Signed)
 SLP Cancellation Note  Patient Details Name: Shirley Woods MRN: [id number] DOB: March 18, 1941   Cancelled treatment:       Reason Eval/Treat Not Completed: Patient not medically ready. Going to OR today, so will plan to f/u on subsequent date for cognitive-linguistic eval.     Leita SAILOR., M.A. CCC-SLP Acute Rehabilitation Services Office: [phone]  Secure chat preferred  04/23/2024, 9:13 AM

## 2024-04-23 NOTE — Anesthesia Preprocedure Evaluation (Addendum)
 Anesthesia Evaluation  Patient identified by MRN, date of birth, ID band Patient awake and Patient confused    Reviewed: Allergy & Precautions, NPO status , Patient's Chart, lab work & pertinent test results  History of Anesthesia Complications Negative for: history of anesthetic complications  Airway Mallampati: II  TM Distance: >3 FB Neck ROM: Full    Dental no notable dental hx.    Pulmonary neg pulmonary ROS   Pulmonary exam normal        Cardiovascular hypertension, Pt. on medications Normal cardiovascular exam     Neuro/Psych       Dementia CT head 04/23/24:  IMPRESSION: 1. Stable left subdural 5 mm hematoma with stable 2 mm left-to-right midline shift. 2. No acute imtracranial findings.  negative neurological ROS     GI/Hepatic negative GI ROS, Neg liver ROS,,,  Endo/Other  negative endocrine ROS    Renal/GU negative Renal ROS     Musculoskeletal Bilateral calcaenus fractures   Abdominal   Peds  Hematology  (+) Blood dyscrasia (Hgb 10.9, Plt 136k), anemia   Anesthesia Other Findings   Reproductive/Obstetrics                              Anesthesia Physical Anesthesia Plan  ASA: 2  Anesthesia Plan: General   Post-op Pain Management: Tylenol  PO (pre-op)*   Induction: Intravenous  PONV Risk Score and Plan: 3 and Treatment may vary due to age or medical condition, Ondansetron  and Dexamethasone  Airway Management Planned: LMA  Additional Equipment: None  Intra-op Plan:   Post-operative Plan: Extubation in OR  Informed Consent: I have reviewed the patients History and Physical, chart, labs and discussed the procedure including the risks, benefits and alternatives for the proposed anesthesia with the patient or authorized representative who has indicated his/her understanding and acceptance.     Dental advisory given and Consent reviewed with POA  Plan Discussed  with: CRNA  Anesthesia Plan Comments:          Anesthesia Quick Evaluation

## 2024-04-23 NOTE — Progress Notes (Signed)
 Trauma/Critical Care Follow Up Note  Subjective:    Overnight Issues:   Objective:  Vital signs for last 24 hours: Temp:  [96.8 F (36 C)-98.9 F (37.2 C)] 98.7 F (37.1 C) (12/12 0800) Pulse Rate:  [69-99] 79 (12/12 0830) Resp:  [13-29] 18 (12/12 0830) BP: (80-200)/(51-107) 117/61 (12/12 0830) SpO2:  [91 %-100 %] 100 % (12/12 0830) Weight:  [68 kg] 68 kg (12/11 1940)  Intake/Output from previous day: 12/11 0701 - 12/12 0700 In: 1297.9 [I.V.:692.9; IV Piggyback:605] Out: -   Intake/Output this shift: Total I/O In: 81.1 [I.V.:75; IV Piggyback:6.1] Out: 0   Vent settings for last 24 hours:    Physical Exam:  Gen: comfortable, no distress Neuro: follows commands, alert, communicative HEENT: PERRL Neck: supple CV: RRR Pulm: unlabored breathing on RA Abd: soft, NT  , no recent BM GU: urine clear and yellow, required I/O cath Extr: wwp, no edema  Results for orders placed or performed during the hospital encounter of 04/22/24 (from the past 24 hours)  Sample to Blood Bank     Status: None   Collection Time: 04/22/24  6:38 PM  Result Value Ref Range   Blood Bank Specimen SAMPLE AVAILABLE FOR TESTING    Sample Expiration      04/25/2024,2359 Performed at [facility], [address], [place], [place] [address]   Comprehensive metabolic panel     Status: Abnormal   Collection Time: 04/22/24  6:40 PM  Result Value Ref Range   Sodium 137 135 - 145 mmol/L   Potassium 3.6 3.5 - 5.1 mmol/L   Chloride 103 98 - 111 mmol/L   CO2 22 22 - 32 mmol/L   Glucose, Bld 151 (H) 70 - 99 mg/dL   BUN 11 8 - 23 mg/dL   Creatinine, Ser 8.97 (H) 0.44 - 1.00 mg/dL   Calcium 8.9 8.9 - 89.6 mg/dL   Total Protein 6.8 6.5 - 8.1 g/dL   Albumin 3.8 3.5 - 5.0 g/dL   AST 48 (H) 15 - 41 U/L   ALT 34 0 - 44 U/L   Alkaline Phosphatase 73 38 - 126 U/L   Total Bilirubin 0.8 0.0 - 1.2 mg/dL   GFR, Estimated 55 (L) >60 mL/min   Anion gap 12 5 - 15  CBC     Status: None   Collection  Time: 04/22/24  6:40 PM  Result Value Ref Range   WBC 6.8 4.0 - 10.5 K/uL   RBC 4.40 3.87 - 5.11 MIL/uL   Hemoglobin 14.1 12.0 - 15.0 g/dL   HCT 57.7 63.9 - 53.9 %   MCV 95.9 80.0 - 100.0 fL   MCH 32.0 26.0 - 34.0 pg   MCHC 33.4 30.0 - 36.0 g/dL   RDW 87.1 88.4 - 84.4 %   Platelets 164 150 - 400 K/uL   nRBC 0.0 0.0 - 0.2 %  Ethanol     Status: None   Collection Time: 04/22/24  6:40 PM  Result Value Ref Range   Alcohol, Ethyl (B) <15 <15 mg/dL  Protime-INR     Status: None   Collection Time: 04/22/24  6:40 PM  Result Value Ref Range   Prothrombin Time 14.3 11.4 - 15.2 seconds   INR 1.1 0.8 - 1.2  I-Stat Chem 8, ED     Status: Abnormal   Collection Time: 04/22/24  6:45 PM  Result Value Ref Range   Sodium 140 135 - 145 mmol/L   Potassium 3.5 3.5 - 5.1  mmol/L   Chloride 103 98 - 111 mmol/L   BUN 11 8 - 23 mg/dL   Creatinine, Ser 8.99 0.44 - 1.00 mg/dL   Glucose, Bld 852 (H) 70 - 99 mg/dL   Calcium, Ion 8.87 (L) 1.15 - 1.40 mmol/L   TCO2 22 22 - 32 mmol/L   Hemoglobin 13.9 12.0 - 15.0 g/dL   HCT 58.9 63.9 - 53.9 %  I-Stat Lactic Acid, ED     Status: Abnormal   Collection Time: 04/22/24  6:45 PM  Result Value Ref Range   Lactic Acid, Venous 2.4 (HH) 0.5 - 1.9 mmol/L   Comment NOTIFIED PHYSICIAN   Urinalysis, Routine w reflex microscopic -Urine, Clean Catch     Status: Abnormal   Collection Time: 04/22/24  7:41 PM  Result Value Ref Range   Color, Urine STRAW (A) YELLOW   APPearance CLEAR CLEAR   Specific Gravity, Urine 1.019 1.005 - 1.030   pH 7.0 5.0 - 8.0   Glucose, UA 150 (A) NEGATIVE mg/dL   Hgb urine dipstick SMALL (A) NEGATIVE   Bilirubin Urine NEGATIVE NEGATIVE   Ketones, ur 5 (A) NEGATIVE mg/dL   Protein, ur 30 (A) NEGATIVE mg/dL   Nitrite NEGATIVE NEGATIVE   Leukocytes,Ua NEGATIVE NEGATIVE   RBC / HPF 0-5 0 - 5 RBC/hpf   WBC, UA 0-5 0 - 5 WBC/hpf   Bacteria, UA RARE (A) NONE SEEN   Squamous Epithelial / HPF 0-5 0 - 5 /HPF  MRSA Next Gen by PCR, Nasal      Status: None   Collection Time: 04/22/24 11:24 PM   Specimen: Nasal Mucosa; Nasal Swab  Result Value Ref Range   MRSA by PCR Next Gen NOT DETECTED NOT DETECTED  CBC     Status: Abnormal   Collection Time: 04/23/24  3:44 AM  Result Value Ref Range   WBC 10.7 (H) 4.0 - 10.5 K/uL   RBC 3.47 (L) 3.87 - 5.11 MIL/uL   Hemoglobin 10.9 (L) 12.0 - 15.0 g/dL   HCT 67.4 (L) 63.9 - 53.9 %   MCV 93.7 80.0 - 100.0 fL   MCH 31.4 26.0 - 34.0 pg   MCHC 33.5 30.0 - 36.0 g/dL   RDW 86.7 88.4 - 84.4 %   Platelets 136 (L) 150 - 400 K/uL   nRBC 0.0 0.0 - 0.2 %  Basic metabolic panel     Status: Abnormal   Collection Time: 04/23/24  3:44 AM  Result Value Ref Range   Sodium 137 135 - 145 mmol/L   Potassium 3.8 3.5 - 5.1 mmol/L   Chloride 106 98 - 111 mmol/L   CO2 23 22 - 32 mmol/L   Glucose, Bld 129 (H) 70 - 99 mg/dL   BUN 13 8 - 23 mg/dL   Creatinine, Ser 8.94 (H) 0.44 - 1.00 mg/dL   Calcium 8.2 (L) 8.9 - 10.3 mg/dL   GFR, Estimated 53 (L) >60 mL/min   Anion gap 8 5 - 15    Assessment & Plan:  LOS: 1 day   Additional comments:I reviewed the patient's new clinical lab test results.   and I reviewed the patients new imaging test results.    62F jump from window   Open R ankle fx, B calcaneus fx, L 2nd metatarsal fx - ortho c/s, Dr. Fidel, handoff to Dr. Kendal, ancef  given in TB, to OR today  L SDH - NSGY c/s, Dr. Lanis, keppra  x7d for sz ppx, repeat head CT stable ?L CFV DVT -  could be venous swirling, recommend US  to confirm, also ?RLL subsegmental PE. Plan US  LLE Urinary retention - place foley since going to OR today, start flomax  FEN - NPO for surgery, okay for diet post-op DVT - SCDs, LMWH when cleared by NSGY Dispo - ICU   Shirley GEANNIE Hanger, MD Trauma & General Surgery Please use [email or web address] to contact on call provider  04/23/2024  *Care during the described time interval was provided by me. I have reviewed this patient's available data, including medical history, events of  note, physical examination and test results as part of my evaluation.

## 2024-04-23 NOTE — Consult Note (Signed)
 HPI:     Pt with PMH of dementia presented to ED after jumping from third story of memory care unit where she resides. Neurologically at baseline. Workup revealing small L SDH w/o MLS or mass effect for which NSGY consulted. Also w/ ankle/foot fx.     Patient Active Problem List   Diagnosis Date Noted   Open ankle fracture 04/22/2024   No past medical history on file.  The histories are not reviewed yet. Please review them in the History navigator section and refresh this SmartLink.  Medications Prior to Admission  Medication Sig Dispense Refill Last Dose/Taking   acetaminophen  (TYLENOL ) 500 MG tablet Take 500 mg by mouth every 6 (six) hours as needed for mild pain (pain score 1-3) or moderate pain (pain score 4-6).   Unknown   amLODipine  (NORVASC ) 5 MG tablet Take 5 mg by mouth daily.   Unknown   donepezil  (ARICEPT ) 5 MG tablet Take 5 mg by mouth every evening.   Unknown   lamoTRIgine  (LAMICTAL ) 150 MG tablet Take 75 mg by mouth at bedtime.   Unknown   LORazepam  (ATIVAN ) 0.5 MG tablet Take 0.5 mg by mouth every 6 (six) hours as needed for anxiety.   Unknown   LORazepam  (ATIVAN ) 1 MG tablet Take 1.5 mg by mouth every evening.   Unknown   QUEtiapine  (SEROQUEL ) 50 MG tablet Take 50 mg by mouth 2 (two) times daily.   Unknown   sertraline  (ZOLOFT ) 100 MG tablet Take 150 mg by mouth every evening.   Unknown   zinc gluconate 50 MG tablet Take 50 mg by mouth daily.   Unknown   Allergies[1]  Social History   Tobacco Use   Smoking status: Not on file   Smokeless tobacco: Not on file  Substance Use Topics   Alcohol use: Not on file    No family history on file.     Objective:   Patient Vitals for the past 8 hrs:  BP Temp Temp src Pulse Resp SpO2  04/23/24 0830 117/61 -- -- 79 18 100 %  04/23/24 0800 (!) 117/54 98.7 F (37.1 C) Axillary 73 (!) 21 100 %  04/23/24 0730 105/63 -- -- 74 13 100 %  04/23/24 0700 (!) 89/63 -- -- 84 14 100 %  04/23/24 0630 112/62 -- -- 72 (!) 21 100  %  04/23/24 0600 121/78 -- -- 81 19 100 %  04/23/24 0530 98/60 -- -- 73 14 100 %  04/23/24 0500 120/65 -- -- 86 (!) 22 100 %  04/23/24 0430 110/63 -- -- 79 20 100 %  04/23/24 0400 119/68 (!) 97.3 F (36.3 C) Axillary 70 15 100 %  04/23/24 0330 129/71 -- -- 88 (!) 24 100 %  04/23/24 0300 110/62 -- -- 82 20 99 %  04/23/24 0230 94/61 -- -- 74 14 100 %  04/23/24 0200 102/63 -- -- 80 15 100 %  04/23/24 0130 118/74 -- -- 96 (!) 23 91 %  04/23/24 0115 101/68 -- -- 99 20 97 %  04/23/24 0100 (!) 80/51 -- -- 91 18 98 %   I/O last 3 completed shifts: In: 1297.9 [I.V.:692.9; IV Piggyback:605] Out: -  Total I/O In: 81.1 [I.V.:75; IV Piggyback:6.1] Out: 0   Drowsy, answers simple questions.   Speech fluent.  CN grossly intact Strength/sensation grossly intact BUE.  Moves lower extremities, b/l splints in place.   CT HEAD WO CONTRAST ( ) Result Date: 04/23/2024 EXAM: CT HEAD WITHOUT 04/23/2024 03:11:21 AM TECHNIQUE: CT  of the head was performed without the administration of intravenous contrast. Automated exposure control, iterative reconstruction, and/or weight based adjustment of the mA/kV was utilized to reduce the radiation dose to as low as reasonably achievable. COMPARISON: 04/22/2024 CLINICAL HISTORY: Subdural hematoma FINDINGS: BRAIN AND VENTRICLES: Stable left subdural 5 mm hematoma. Stable 2 mm left to right mild midline shift. Periventricular white matter changes, likely sequela of chronic small vessel ischemic disease. Age related cerebral atrophy. No new hemorrhage. No evidence of acute infarct. No hydrocephalus. ORBITS: Bilateral lens replacements. SINUSES AND MASTOIDS: Fluid in right mastoid air cells. SOFT TISSUES AND SKULL: No acute skull fracture. No acute soft tissue abnormality. IMPRESSION: 1. Stable left subdural 5 mm hematoma with stable 2 mm left-to-right midline shift. 2. No acute imtracranial findings. Electronically signed by: Morgane Naveau MD 04/23/2024 03:19 AM EST RP  Workstation: [id number]   CT FOOT LEFT WO CONTRAST Result Date: 04/22/2024 EXAM: CT OF THE LEFT FOOT, WITHOUT IV CONTRAST 04/22/2024 09:31:59 PM TECHNIQUE: Axial images were acquired through the left foot without IV contrast. Reformatted images were reviewed. Automated exposure control, iterative reconstruction, and/or weight based adjustment of the mA/kV was utilized to reduce the radiation dose to as low as reasonably achievable. COMPARISON: Plain films today. CLINICAL HISTORY: Fracture, foot. FINDINGS: BONES: Highly comminuted fracture noted through the calcaneus. Fracture noted through the medial navicular. Nondisplaced fracture through the cuboid. Nondisplaced fracture through the base of the 3rd metatarsal. JOINTS: No dislocation. The joint spaces are normal. SOFT TISSUES: The soft tissues are unremarkable. IMPRESSION: 1. Highly comminuted fracture of the calcaneus. 2. Fracture of the medial navicular. 3. Nondisplaced fracture of the cuboid. 4. Nondisplaced fracture of the base of the third metatarsal. Electronically signed by: Franky Crease MD 04/22/2024 10:10 PM EST RP Workstation: [id number]   CT FOOT RIGHT WO CONTRAST Result Date: 04/22/2024 EXAM: CT OF THE RIGHT FOOT, WITHOUT IV CONTRAST 04/22/2024 09:31:59 PM TECHNIQUE: Axial images were acquired through the right foot without IV contrast. Reformatted images were reviewed. Automated exposure control, iterative reconstruction, and/or weight based adjustment of the mA/kV was utilized to reduce the radiation dose to as low as reasonably achievable. COMPARISON: Plain films today. CLINICAL HISTORY: Fracture, foot. FINDINGS: BONES: Highly comminuted and displaced calcaneal fracture. Lateral malleolar fracture noted. JOINTS: No dislocation. The joint spaces are normal. SOFT TISSUES: Gas throughout the soft tissues compatible with open fracture. IMPRESSION: 1. Highly comminuted and displaced calcaneal fracture. 2. Lateral malleolar fracture. 3. Gas  throughout the soft tissues compatible with open fracture. Electronically signed by: Franky Crease MD 04/22/2024 09:43 PM EST RP Workstation: [id number]   DG Foot Complete Right Result Date: 04/22/2024 CLINICAL DATA:  Clemens from third story window EXAM: RIGHT FOOT COMPLETE - 3+ VIEW; LEFT FOOT - COMPLETE 3+ VIEW COMPARISON:  04/22/2024 FINDINGS: Right foot: Frontal, oblique, and lateral views of the right foot are obtained. Casting material is in place. The open comminuted calcaneal fracture seen previously is again noted, with no change in alignment. The lateral malleolar fracture is better visualized on the previous ankle evaluation. No other acute displaced fractures. There is diffuse osteoarthritis throughout the midfoot and interphalangeal joints, with degenerative subluxation at the fourth and fifth proximal interphalangeal joints. Diffuse soft tissue swelling of the ankle and hindfoot. Left foot: Frontal, oblique, lateral views of the left foot are obtained. Casting material obscures underlying bony detail. Comminuted intra-articular calcaneal fracture is identified, in stable alignment. There is a minimally displaced extra-articular transverse fracture at the base of the third metatarsal, best  seen on the lateral and oblique projections. No other acute bony abnormalities. 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 IMPRESSION: Right foot: 1. Open comminuted calcaneal fracture similar to prior study. 2. The known lateral malleolar fracture is better visualized on the preceding ankle study. 3. Multifocal osteoarthritis. Left foot: 1. Comminuted intra-articular calcaneal fracture similar to prior study. 2. Nondisplaced transverse extra-articular fracture at the base of the third metatarsal. 3. 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 Electronically Signed   By: Ozell Daring M.D.   On: 04/22/2024 20:34   DG Foot Complete Left Result Date: 04/22/2024 CLINICAL DATA:  Clemens from third story  window EXAM: RIGHT FOOT COMPLETE - 3+ VIEW; LEFT FOOT - COMPLETE 3+ VIEW COMPARISON:  04/22/2024 FINDINGS: Right foot: Frontal, oblique, and lateral views of the right foot are obtained. Casting material is in place. The open comminuted calcaneal fracture seen previously is again noted, with no change in alignment. The lateral malleolar fracture is better visualized on the previous ankle evaluation. No other acute displaced fractures. There is diffuse osteoarthritis throughout the midfoot and interphalangeal joints, with degenerative subluxation at the fourth and fifth proximal interphalangeal joints. Diffuse soft tissue swelling of the ankle and hindfoot. Left foot: Frontal, oblique, lateral views of the left foot are obtained. Casting material obscures underlying bony detail. Comminuted intra-articular calcaneal fracture is identified, in stable alignment. There is a minimally displaced extra-articular transverse fracture at the base of the third metatarsal, best seen on the lateral and oblique projections. No other acute bony abnormalities. 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 IMPRESSION: Right foot: 1. Open comminuted calcaneal fracture similar to prior study. 2. The known lateral malleolar fracture is better visualized on the preceding ankle study. 3. Multifocal osteoarthritis. Left foot: 1. Comminuted intra-articular calcaneal fracture similar to prior study. 2. Nondisplaced transverse extra-articular fracture at the base of the third metatarsal. 3. 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 Electronically Signed   By: Ozell Daring M.D.   On: 04/22/2024 20:34   DG Ankle Right Port Result Date: 04/22/2024 EXAM: 1 OR 2 VIEW(S) XRAY OF THE ANKLE 04/22/2024 07:23:09 PM CLINICAL HISTORY: Fracture, open [id number] COMPARISON: None available. FINDINGS: BONES AND JOINTS: Acute displaced and mildly comminuted lateral malleolus fracture. Possible lateral talus avulsion fracture. Acute comminuted  calcaneus fractures with subtalar joints malalignment. Limited bone evaluation reveals no definite medial or posterior malleolar fractures. Midfoot degenerative changes are present. SOFT TISSUES: Extensive soft tissue swelling with soft tissue emphysema overlying the lateral malleolus. IMPRESSION: 1. Acute displaced and mildly comminuted lateral malleolus fracture. 2. Possible lateral talus avulsion fracture. 3. Acute comminuted calcaneus fractures with subtalar joint malalignment. 4. Recommend CT for further evaluation. Electronically signed by: Morgane Naveau MD 04/22/2024 07:41 PM EST RP Workstation: [id number]   DG Ankle Left Port Result Date: 04/22/2024 EXAM: 1 OR 2 VIEW(S) XRAY OF THE UNSPECIFIED ANKLE 04/22/2024 07:23:09 PM CLINICAL HISTORY: Fracture, open 03936 COMPARISON: None available. FINDINGS: BONES AND JOINTS: Acute comminuted and displaced fracture of the anterior process of the calcaneus. Acute minimally displaced fracture of the base of the second metatarsal. Fracture not well visualized. Disruption of the subtalar joint. No acute medial, lateral, posterior malleolar fracture. No dislocation. SOFT TISSUES: Subcutaneous soft tissue edema. IMPRESSION: 1. Acute comminuted and displaced fracture of the anterior process of the calcaneus. 2. Acute minimally displaced fracture of the base of the second metatarsal, not well visualized. 3. Recommended dedicated 3-view left foot radiographs. Electronically signed by: Morgane Naveau MD 04/22/2024 07:35 PM EST RP Workstation: [id number]  CT CERVICAL SPINE WO CONTRAST Result Date: 04/22/2024 EXAM: CT CERVICAL SPINE WITHOUT CONTRAST 04/22/2024 06:58:00 PM TECHNIQUE: CT of the cervical spine was performed without the administration of intravenous contrast. Multiplanar reformatted images are provided for review. Automated exposure control, iterative reconstruction, and/or weight based adjustment of the mA/kV was utilized to reduce the radiation dose to as  low as reasonably achievable. COMPARISON: None available. CLINICAL HISTORY: Polytrauma, blunt. FINDINGS: CERVICAL SPINE: BONES AND ALIGNMENT: Trace degenerative anterolisthesis of C7 on T1, likely related to facet arthrosis. No evidence of traumatic malalignment. No acute fracture. DEGENERATIVE CHANGES: Disc space narrowing most pronounced at C4-C5 and C5-C6. Degenerative endplate osteophytes and endplate irregularity particularly at C4-C5 and C5-C6. Facet arthrosis and uncovertebral hypertrophy at multiple levels. Foraminal stenosis at multiple levels throughout the cervical spine, most pronounced on the right at C7-T1. There is no evidence of high grade osseous spinal canal stenosis. SOFT TISSUES: No prevertebral soft tissue swelling. Heterogeneous appearance of the thyroid with multiple nodules noted. The largest nodule in the left thyroid lobe measures approximately 1.3 cm in diameter. LUNGS: Biapical pleural scarring. IMPRESSION: 1. No acute abnormality of the cervical spine. 2. Multilevel degenerative changes as above. 3. Findings discussed with Dr. Paola at 7:10PM on 04/22/24. Electronically signed by: Donnice Mania MD 04/22/2024 07:21 PM EST RP Workstation: [id number]   CT CHEST ABDOMEN PELVIS W CONTRAST Result Date: 04/22/2024 EXAM: CT CHEST, ABDOMEN AND PELVIS WITH CONTRAST 04/22/2024 06:58:00 PM TECHNIQUE: CT of the chest, abdomen and pelvis was performed with the administration of 75 mL of iohexol  (OMNIPAQUE ) 350 MG/ML injection. Multiplanar reformatted images are provided for review. Automated exposure control, iterative reconstruction, and/or weight based adjustment of the mA/kV was utilized to reduce the radiation dose to as low as reasonably achievable. COMPARISON: None available. CLINICAL HISTORY: Polytrauma, blunt. FINDINGS: CHEST: MEDIASTINUM AND LYMPH NODES: Heart and pericardium are unremarkable. The central airways are clear. No mediastinal, hilar or axillary lymphadenopathy. Left thyroid  nodules, better evaluated on prior thyroid ultrasound, no follow-up recommended. LUNGS AND PLEURA: Trace left pleural effusion. Equivocal subsegmental filling defect in the right lower lobe pulmonary artery (image 30), although poorly evaluated. No focal consolidation or pulmonary edema. No pneumothorax. ABDOMEN AND PELVIS: LIVER: The liver is unremarkable. GALLBLADDER AND BILE DUCTS: Gallbladder is unremarkable. No biliary ductal dilatation. SPLEEN: Calcified splenic granulomas. PANCREAS: No acute abnormality. ADRENAL GLANDS: No acute abnormality. KIDNEYS, URETERS AND BLADDER: No stones in the kidneys or ureters. No hydronephrosis. No perinephric or periureteral stranding. Urinary bladder is unremarkable. GI AND BOWEL: Moderate hiatal hernia. Colonic diverticulosis without acute diverticulitis. Stomach demonstrates no acute abnormality. There is no bowel obstruction. REPRODUCTIVE ORGANS: Status post hysterectomy. PERITONEUM AND RETROPERITONEUM: No ascites. No free air. VASCULATURE: Aorta is normal in caliber. Severe atherosclerotic plaque, including aortic atherosclerosis. Possible filling defect within left common femoral vein (image 106). ABDOMINAL AND PELVIS LYMPH NODES: No lymphadenopathy. BONES AND SOFT TISSUES: Right shoulder arthroplasty noted. Old left lower posterolateral rib fracture deformities. Mild degenerative changes of the thoracolumbar spine. No acute osseous abnormality. No focal soft tissue abnormality. IMPRESSION: 1. No acute traumatic injury to the chest, abdomen, or pelvis. 2. Possible deep venous thrombosis within the left common femoral vein, consider lower extremity Doppler for confirmation. 3. Possible subsegmental pulmonary embolus in the right lower lobe pulmonary artery, equivocal. In the absence of DVT, this would likely reflect artifact on this non-dedicated evaluation. 4. Trace left pleural effusion. 5. Critical Value/emergent results were called by telephone at the time of  interpretation on 04/22/2024  at 1910 hrs to provider Dr Paola. Electronically signed by: Pinkie Pebbles MD 04/22/2024 07:14 PM EST RP Workstation: [id number]   CT Head Wo Contrast Result Date: 04/22/2024 EXAM: CT HEAD WITHOUT 04/22/2024 06:58:00 PM TECHNIQUE: CT of the head was performed without the administration of intravenous contrast. Automated exposure control, iterative reconstruction, and/or weight based adjustment of the mA/kV was utilized to reduce the radiation dose to as low as reasonably achievable. COMPARISON: None available. CLINICAL HISTORY: Polytrauma, blunt, pregnant; LEVEL 1 BLUNT FINDINGS: BRAIN AND VENTRICLES: Acute subdural hematoma along the left cerebral convexity measuring up to 5 mm in maximum thickness. No significant associated mass effect. No midline shift. No evidence of parenchymal hemorrhage. No intraventricular hemorrhage. Chronic microvascular ischemic changes noted. Mild generalized volume loss. No evidence of acute infarct. No hydrocephalus. ORBITS: Bilateral lens replacement. SINUSES AND MASTOIDS: No acute abnormality. SOFT TISSUES AND SKULL: Atherosclerosis of the carotid siphons. No acute skull fracture. No acute soft tissue abnormality. IMPRESSION: 1. Acute left convexity subdural hematoma measuring up to 5 mm without significant mass effect or midline shift. 2. No parenchymal or intraventricular hemorrhage. 3. TBI risk stratification: moderate - mbig 2. 4. Findings discussed with Dr. Paola at 7:10 PM on 04/22/24. Electronically signed by: Donnice Mania MD 04/22/2024 07:13 PM EST RP Workstation: [id number]   DG Pelvis Portable Result Date: 04/22/2024 CLINICAL DATA:  Fall from 30 feet. EXAM: PORTABLE PELVIS 1-2 VIEWS COMPARISON:  None Available. FINDINGS: Minimal symmetric osteoarthritic change of the hips. No acute fracture of the hips or pelvis. Moderate degenerative change of the spine. IMPRESSION: 1. No acute findings. 2. Minimal symmetric osteoarthritic change of  the hips. Electronically Signed   By: Toribio Agreste M.D.   On: 04/22/2024 18:50   DG Chest Port 1 View Result Date: 04/22/2024 CLINICAL DATA:  Trauma. EXAM: PORTABLE CHEST 1 VIEW COMPARISON:  None Available. FINDINGS: No focal consolidation, pleural effusion or pneumothorax. Top-normal cardiac size. No acute osseous pathology. Right shoulder arthroplasty. IMPRESSION: No active disease. Electronically Signed   By: Vanetta Chou M.D.   On: 04/22/2024 18:50      Assessment:   This is a 83yo female with underlying dementia who suffered L SDH after fall/jump from third story. SDH stable on repeat imaging without significant  MLS/mass effect.    Plan:   -Continue supportive care per primary team -Continue prophylactic Keppra  x7days.  -Ok for DVT chemoppx tmrw.   Jood Retana CAYLIN Hanad Leino, PA-C         [1] No Known Allergies

## 2024-04-23 NOTE — Progress Notes (Signed)
 DIL, Sheri Vetter, called to update when patient arrived to pre-op.

## 2024-04-23 NOTE — Progress Notes (Signed)
 Orthopedic Tech Progress Note Patient Details:  Taccara Bushnell Cajas 1940-08-07 [id number]  Ortho Devices Type of Ortho Device: CAM walker Ortho Device/Splint Location: BLE Ortho Device/Splint Interventions: Ordered   Post Interventions Patient Tolerated: Well Instructions Provided: Care of device, Adjustment of device  Nhia Heaphy A Leanna Hamid 04/23/2024, 3:44 PM

## 2024-04-23 NOTE — Interval H&P Note (Signed)
 History and Physical Interval Note:  04/23/2024 11:27 AM  Shirley Woods  has presented today for surgery, with the diagnosis of Bilateral calcaenus fractures.  The various methods of treatment have been discussed with the patient and family. After consideration of risks, benefits and other options for treatment, the patient has consented to  Procedures: OPEN REDUCTION INTERNAL FIXATION (ORIF) CALCANEOUS FRACTURE (Bilateral) as a surgical intervention.  The patient's history has been reviewed, patient examined, no change in status, stable for surgery.  I have reviewed the patient's chart and labs.  Questions were answered to the patient's satisfaction.     Allex Lapoint P Leelynd Maldonado

## 2024-04-23 NOTE — Anesthesia Postprocedure Evaluation (Signed)
 Anesthesia Post Note  Patient: Shirley Woods  Procedure(s) Performed: OPEN REDUCTION INTERNAL FIXATION (ORIF) CALCANEOUS FRACTURE (Right: Foot) PINNING, EXTREMITY, PERCUTANEOUS (Left: Foot) IRRIGATION AND DEBRIDEMENT FOOT (Right: Foot)     Patient location during evaluation: PACU Anesthesia Type: General Level of consciousness: awake and alert Pain management: pain level controlled Vital Signs Assessment: post-procedure vital signs reviewed and stable Respiratory status: spontaneous breathing, nonlabored ventilation and respiratory function stable Cardiovascular status: blood pressure returned to baseline Postop Assessment: no apparent nausea or vomiting Anesthetic complications: no   No notable events documented.  Last Vitals:  Vitals:   04/23/24 1415 04/23/24 1437  BP: 118/73   Pulse: 91   Resp: 15   Temp: 36.4 C (!) 36.3 C  SpO2: 98%     Last Pain:  Vitals:   04/23/24 1437  TempSrc: Axillary  PainSc:                  Vertell Row

## 2024-04-24 ENCOUNTER — Inpatient Hospital Stay (HOSPITAL_COMMUNITY)

## 2024-04-24 DIAGNOSIS — S82892A Other fracture of left lower leg, initial encounter for closed fracture: Secondary | ICD-10-CM

## 2024-04-24 LAB — CBC
HCT: 22.5 % — ABNORMAL LOW (ref 36.0–46.0)
Hemoglobin: 7.6 g/dL — ABNORMAL LOW (ref 12.0–15.0)
MCH: 31.7 pg (ref 26.0–34.0)
MCHC: 33.8 g/dL (ref 30.0–36.0)
MCV: 93.8 fL (ref 80.0–100.0)
Platelets: 88 K/uL — ABNORMAL LOW (ref 150–400)
RBC: 2.4 MIL/uL — ABNORMAL LOW (ref 3.87–5.11)
RDW: 13.3 % (ref 11.5–15.5)
WBC: 7.5 K/uL (ref 4.0–10.5)
nRBC: 0 % (ref 0.0–0.2)

## 2024-04-24 LAB — BASIC METABOLIC PANEL WITH GFR
Anion gap: 3 — ABNORMAL LOW (ref 5–15)
BUN: 15 mg/dL (ref 8–23)
CO2: 26 mmol/L (ref 22–32)
Calcium: 8.2 mg/dL — ABNORMAL LOW (ref 8.9–10.3)
Chloride: 108 mmol/L (ref 98–111)
Creatinine, Ser: 0.98 mg/dL (ref 0.44–1.00)
GFR, Estimated: 58 mL/min — ABNORMAL LOW
Glucose, Bld: 132 mg/dL — ABNORMAL HIGH (ref 70–99)
Potassium: 3.8 mmol/L (ref 3.5–5.1)
Sodium: 137 mmol/L (ref 135–145)

## 2024-04-24 LAB — CBC WITH DIFFERENTIAL/PLATELET
Abs Immature Granulocytes: 0.05 K/uL (ref 0.00–0.07)
Basophils Absolute: 0 K/uL (ref 0.0–0.1)
Basophils Relative: 0 %
Eosinophils Absolute: 0 K/uL (ref 0.0–0.5)
Eosinophils Relative: 0 %
HCT: 21.1 % — ABNORMAL LOW (ref 36.0–46.0)
Hemoglobin: 7 g/dL — ABNORMAL LOW (ref 12.0–15.0)
Immature Granulocytes: 1 %
Lymphocytes Relative: 16 %
Lymphs Abs: 1.2 K/uL (ref 0.7–4.0)
MCH: 31.5 pg (ref 26.0–34.0)
MCHC: 33.2 g/dL (ref 30.0–36.0)
MCV: 95 fL (ref 80.0–100.0)
Monocytes Absolute: 0.8 K/uL (ref 0.1–1.0)
Monocytes Relative: 11 %
Neutro Abs: 5.3 K/uL (ref 1.7–7.7)
Neutrophils Relative %: 72 %
Platelets: 85 K/uL — ABNORMAL LOW (ref 150–400)
RBC: 2.22 MIL/uL — ABNORMAL LOW (ref 3.87–5.11)
RDW: 13.2 % (ref 11.5–15.5)
WBC: 7.4 K/uL (ref 4.0–10.5)
nRBC: 0 % (ref 0.0–0.2)

## 2024-04-24 LAB — HEMOGLOBIN AND HEMATOCRIT, BLOOD
HCT: 22.7 % — ABNORMAL LOW (ref 36.0–46.0)
Hemoglobin: 7.5 g/dL — ABNORMAL LOW (ref 12.0–15.0)

## 2024-04-24 LAB — PREPARE RBC (CROSSMATCH)

## 2024-04-24 LAB — ABO/RH: ABO/RH(D): A POS

## 2024-04-24 MED ORDER — ENSURE PLUS HIGH PROTEIN PO LIQD
237.0000 mL | Freq: Two times a day (BID) | ORAL | Status: DC
  Administered 2024-04-25 – 2024-05-18 (×33): 237 mL via ORAL
  Filled 2024-04-24: qty 237

## 2024-04-24 MED ORDER — SODIUM CHLORIDE 0.9% IV SOLUTION: Freq: Once | INTRAVENOUS | Status: AC

## 2024-04-24 MED ADMIN — Levetiracetam Inj 500 MG/5ML (100 MG/ML): 500 mg | INTRAVENOUS | NDC 00409188622

## 2024-04-24 MED ADMIN — Quetiapine Fumarate Tab 50 MG: 50 mg | ORAL | NDC 67877024901

## 2024-04-24 MED ADMIN — Cefazolin Sodium-Dextrose IV Solution 2 GM/100ML-4%: 2 g | INTRAVENOUS | NDC 00338350841

## 2024-04-24 MED ADMIN — Lorazepam Inj 2 MG/ML: 1 mg | INTRAVENOUS | NDC 65219036801

## 2024-04-24 MED ADMIN — Chlorhexidine Gluconate Pads 2%: 6 | TOPICAL | NDC 53462070523

## 2024-04-24 MED FILL — Levetiracetam Tab 500 MG: 500.0000 mg | ORAL | Qty: 1 | Status: AC

## 2024-04-24 NOTE — Progress Notes (Signed)
 VASCULAR LAB    Left lower extremity venous duplex has been performed.  See CV proc for preliminary results.   Dillan Candela, RVT 04/24/2024, 2:48 PM

## 2024-04-24 NOTE — Evaluation (Signed)
 Physical Therapy Evaluation Patient Details Name: Shirley Woods MRN: [id number] DOB: Dec 27, 1940 Today's Date: 04/24/2024  History of Present Illness  Shirley Woods is an 83 y.o. female who fell out of a 3rd story window trying to escape from memory care unit at [facility]. Upon arrival found to have Bil LE fxs and now is s/p ORIF of right calcaneus and right subtalar joint dislocation; closed reduction of left calcaneus fx, right lateral malleolus fx; closed reduction and percutaneous fixation of left talonavicular joint subluxation; and percutaneous fixation of left navicular fx. 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 PHMx: dementia,  Clinical Impression  Pt admitted with/for fall and trauma as stated above.  Pt is now NWB on both LE's and thus is restricted to lateral scoots or A/_P transfers for weeks.  Pt needed mod assist and /or cuing for the transfer and mod also for transition to EOB with +2 assist for safety.  Pt currently limited functionally due to the problems listed. ( See problems list.)   Pt will benefit from PT to maximize function and safety in order to get ready for next venue listed below.         If plan is discharge home, recommend the following: A lot of help with walking and/or transfers;A lot of help with bathing/dressing/bathroom;Assistance with cooking/housework;Direct supervision/assist for medications management;Direct supervision/assist for financial management;Assist for transportation;Help with stairs or ramp for entrance;Supervision due to cognitive status   Can travel by private vehicle   No    Equipment Recommendations Other (comment);None recommended by PT (TBA at a later venue)  Recommendations for Other Services       Functional Status Assessment Patient has had a recent decline in their functional status and demonstrates the ability to make significant improvements in function in a reasonable and  predictable amount of time.     Precautions / Restrictions Precautions Precautions: Fall Recall of Precautions/Restrictions: Impaired Precaution/Restrictions Comments: CAM boots when OOB Restrictions Weight Bearing Restrictions Per Provider Order: Yes RLE Weight Bearing Per Provider Order: Non weight bearing LLE Weight Bearing Per Provider Order: Non weight bearing Other Position/Activity Restrictions: Per Camie Moores, PA-C on 12/13 via secure chat. That is fine to allow her to have her feet on the ground in the CAM boots to assist with lateral scoot but do not want her trying to stand and transfer. Thanks      Mobility  Bed Mobility Overal bed mobility: Needs Assistance Bed Mobility: Supine to Sit, Sit to Supine     Supine to sit: Mod assist, +2 for safety/equipment          Transfers Overall transfer level: Needs assistance   Transfers: Bed to chair/wheelchair/BSC            Lateral/Scoot Transfers: Mod assist, +2 safety/equipment General transfer comment: constant VCs for technque and to keep scooting.  Pt able to clear most of the vertical surfaces    Ambulation/Gait                  Stairs            Wheelchair Mobility     Tilt Bed    Modified Rankin (Stroke Patients Only)       Balance Overall balance assessment: Needs assistance Sitting-balance support: No upper extremity supported, Feet supported Sitting balance-Leahy Scale: Fair  Pertinent Vitals/Pain Pain Assessment Pain Assessment: Faces Faces Pain Scale: Hurts a little bit Breathing: normal Negative Vocalization: none Facial Expression: smiling or inexpressive Body Language: relaxed Consolability: no need to console PAINAD Score: 0 Pain Location: Did report RLE was hurting a little Pain Intervention(s): Monitored during session    Home Living Family/patient expects to be discharged to:: Assisted living (memory  care) Living Arrangements: Other (Comment) (caregivers at memory care SNF) Available Help at Discharge: Personal care attendant;Available PRN/intermittently (at memory care SNF, someone always in building but not always with her) Type of Home:  (memory care  SNF)         Home Layout: One level Home Equipment: None      Prior Function Prior Level of Function : Independent/Modified Independent             Mobility Comments: based on why she was here appears she was fairly mobile ADLs Comments: unknown     Extremity/Trunk Assessment   Upper Extremity Assessment Upper Extremity Assessment: Defer to OT evaluation    Lower Extremity Assessment Lower Extremity Assessment:  (pt able to kick each LE out in a LAQ against gravity and hip flexion against gravity. while maintaining balance)    Cervical / Trunk Assessment Cervical / Trunk Assessment: Normal  Communication   Communication Communication: Impaired Factors Affecting Communication: Hearing impaired    Cognition Arousal: Alert Behavior During Therapy: WFL for tasks assessed/performed   PT - Cognitive impairments: History of cognitive impairments                         Following commands: Impaired Following commands impaired: Follows one step commands inconsistently, Follows one step commands with increased time     Cueing Cueing Techniques: Verbal cues, Gestural cues, Tactile cues, Visual cues     General Comments General comments (skin integrity, edema, etc.): vss on RA    Exercises Other Exercises Other Exercises: several reps of warm up ROM bil   Assessment/Plan    PT Assessment Patient needs continued PT services  PT Problem List Decreased activity tolerance;Decreased mobility;Decreased knowledge of use of DME;Decreased safety awareness;Pain;Decreased balance;Decreased strength       PT Treatment Interventions Functional mobility training;Therapeutic activities;Therapeutic  exercise;Balance training;Patient/family education    PT Goals (Current goals can be found in the Care Plan section)  Acute Rehab PT Goals Patient Stated Goal: pt unable PT Goal Formulation: Patient unable to participate in goal setting Time For Goal Achievement: 05/08/24 Potential to Achieve Goals: Good    Frequency Min 3X/week     Co-evaluation PT/OT/SLP Co-Evaluation/Treatment: Yes Reason for Co-Treatment: Complexity of the patient's impairments (multi-system involvement);For patient/therapist safety PT goals addressed during session: Mobility/safety with mobility         AM-PAC PT 6 Clicks Mobility  Outcome Measure Help needed turning from your back to your side while in a flat bed without using bedrails?: Total Help needed moving from lying on your back to sitting on the side of a flat bed without using bedrails?: Total Help needed moving to and from a bed to a chair (including a wheelchair)?: Total Help needed standing up from a chair using your arms (e.g., wheelchair or bedside chair)?: Total Help needed to walk in hospital room?: Total Help needed climbing 3-5 steps with a railing? : Total 6 Click Score: 6    End of Session Equipment Utilized During Treatment: Other (comment) (bil cam walker boots.) Activity Tolerance: Patient tolerated treatment well Patient left:  in chair;Other (comment);with chair alarm set (posey alarm belt) Nurse Communication: Mobility status PT Visit Diagnosis: Pain;Other abnormalities of gait and mobility (R26.89) Pain - Right/Left: Right (bil) Pain - part of body: Leg;Hip    Time: 9054-8976 PT Time Calculation (min) (ACUTE ONLY): 38 min   Charges:   PT Evaluation $PT Eval Moderate Complexity: 1 Mod   PT General Charges $$ ACUTE PT VISIT: 1 Visit         04/24/2024  India HERO., PT Acute Rehabilitation Services [phone]  (office)  Vinie GAILS Madine Sarr 04/24/2024, 3:18 PM

## 2024-04-24 NOTE — Progress Notes (Signed)
 Patient ID: Shirley Woods, female   DOB: 1940-10-22, 83 y.o.   MRN: [id number]   Follow up - Trauma Critical Care  Patient Details:    Shirley Woods is an 83 y.o. female.  Lines/tubes : Negative Pressure Wound Therapy Foot Right (Active)  Site / Wound Assessment Dressing in place / Unable to assess 04/24/24 1200  Cycle Continuous 04/24/24 0800  Target Pressure (mmHg) 50 04/24/24 1200  Machine plugged into wall outlet (NOT bed outlet) Yes 04/24/24 1200  Dressing Status Intact 04/24/24 1200  Drainage Amount None 04/24/24 0800  Output (mL) 10 mL 04/24/24 0600     Urethral Catheter Jori Frizzle, RN Latex 14 Fr. (Active)  Indication for Insertion or Continuance of Catheter Acute urinary retention (I&O Cath for 24 hrs prior to catheter insertion- Inpatient Only) 04/24/24 0800  Site Assessment Clean, Dry, Intact 04/24/24 1200  Catheter Maintenance Bag below level of bladder;Catheter secured;Drainage bag/tubing not touching floor;Insertion date on drainage bag;No dependent loops;Seal intact 04/24/24 0800  Collection Container Standard drainage bag 04/24/24 1200  Securement Method Adhesive securement device 04/24/24 1200  Urinary Catheter Interventions (if applicable) Unclamped 04/23/24 2000  Output (mL) 150 mL 04/24/24 1200    Microbiology/Sepsis markers: Results for orders placed or performed during the hospital encounter of 04/22/24  MRSA Next Gen by PCR, Nasal     Status: None   Collection Time: 04/22/24 11:24 PM   Specimen: Nasal Mucosa; Nasal Swab  Result Value Ref Range Status   MRSA by PCR Next Gen NOT DETECTED NOT DETECTED Final    Comment: (NOTE) The GeneXpert MRSA Assay (FDA approved for NASAL specimens only), is one component of a comprehensive MRSA colonization surveillance program. It is not intended to diagnose MRSA infection nor to guide or monitor treatment for MRSA infections. Test performance is not FDA approved in patients less than 80  years old. Performed at [facility], [address] [place], [place] [address]   Surgical PCR screen     Status: None   Collection Time: 04/23/24  7:37 AM   Specimen: Nasal Mucosa; Nasal Swab  Result Value Ref Range Status   MRSA, PCR NEGATIVE NEGATIVE Final   Staphylococcus aureus NEGATIVE NEGATIVE Final    Comment: (NOTE) The Xpert SA Assay (FDA approved for NASAL specimens in patients 10 years of age and older), is one component of a comprehensive surveillance program. It is not intended to diagnose infection nor to guide or monitor treatment. Performed at [facility], [address] [place], [place] [address]     Anti-infectives:  Anti-infectives (From admission, onward)    Start     Dose/Rate Route Frequency Ordered Stop   04/23/24 2000  ceFAZolin  (ANCEF ) IVPB 2g/100 mL premix        2 g 200 mL/hr over 30 Minutes Intravenous Every 8 hours 04/23/24 1509 04/24/24 1259   04/23/24 1259  vancomycin  (VANCOCIN ) powder  Status:  Discontinued          As needed 04/23/24 1259 04/23/24 1338   04/23/24 0915  ceFAZolin  (ANCEF ) IVPB 2g/100 mL premix        2 g 200 mL/hr over 30 Minutes Intravenous To ShortStay Surgical 04/23/24 0858 04/23/24 1304   04/23/24 0907  ceFAZolin  (ANCEF ) 2-4 GM/100ML-% IVPB       Note to Pharmacy: Shirley Woods: cabinet override      04/23/24 0907 04/23/24 1234   04/22/24 1845  ceFAZolin  (ANCEF ) IVPB 2g/100 mL premix  2 g 200 mL/hr over 30 Minutes Intravenous  Once 04/22/24 1836 04/22/24 1908       Best Practice/Protocols:  N/a N/a  Consults: Treatment Team:  Md, Trauma, MD Haddix, Franky SQUIBB, MD Lanis Pupa, MD    Studies:    Events:  Subjective:    Overnight Issues:  No acute events Hgb did drop Objective:  Vital signs for last 24 hours: Temp:  [97.7 F (36.5 C)-100 F (37.8 C)] 97.7 F (36.5 C) (12/13 1200) Pulse Rate:  [76-112] 91 (12/13 1330) Resp:  [12-28] 18 (12/13 1330) BP: (86-135)/(52-78)  112/71 (12/13 1330) SpO2:  [90 %-100 %] 93 % (12/13 1330)  Hemodynamic parameters for last 24 hours:    Intake/Output from previous day: 12/12 0701 - 12/13 0700 In: 1762.7 [P.O.:360; I.V.:1091.5; IV Piggyback:311.1] Out: 835 [Urine:775; Drains:10; Blood:50]  Intake/Output this shift: Total I/O In: 105 [IV Piggyback:105] Out: 450 [Urine:450]  Vent settings for last 24 hours:    Physical Exam:  Gen: comfortable, no distress Neuro: follows commands, alert, communicative HEENT: PERRL Neck: supple CV: RRR Pulm: unlabored breathing on RA Abd: soft, NT  , no recent BM GU: urine clear and yellow, required I/O cath Extr: wwp, no edema  Results for orders placed or performed during the hospital encounter of 04/22/24 (from the past 24 hours)  VITAMIN D  25 Hydroxy (Vit-D Deficiency, Fractures)     Status: None   Collection Time: 04/23/24  6:09 PM  Result Value Ref Range   Vit D, 25-Hydroxy 31.65 30 - 100 ng/mL  Basic metabolic panel     Status: Abnormal   Collection Time: 04/24/24  1:55 AM  Result Value Ref Range   Sodium 137 135 - 145 mmol/L   Potassium 3.8 3.5 - 5.1 mmol/L   Chloride 108 98 - 111 mmol/L   CO2 26 22 - 32 mmol/L   Glucose, Bld 132 (H) 70 - 99 mg/dL   BUN 15 8 - 23 mg/dL   Creatinine, Ser 9.01 0.44 - 1.00 mg/dL   Calcium 8.2 (L) 8.9 - 10.3 mg/dL   GFR, Estimated 58 (L) >60 mL/min   Anion gap 3 (L) 5 - 15  CBC     Status: Abnormal   Collection Time: 04/24/24  6:40 AM  Result Value Ref Range   WBC 7.5 4.0 - 10.5 K/uL   RBC 2.40 (L) 3.87 - 5.11 MIL/uL   Hemoglobin 7.6 (L) 12.0 - 15.0 g/dL   HCT 77.4 (L) 63.9 - 53.9 %   MCV 93.8 80.0 - 100.0 fL   MCH 31.7 26.0 - 34.0 pg   MCHC 33.8 30.0 - 36.0 g/dL   RDW 86.6 88.4 - 84.4 %   Platelets 88 (L) 150 - 400 K/uL   nRBC 0.0 0.0 - 0.2 %  Hemoglobin and hematocrit, blood     Status: Abnormal   Collection Time: 04/24/24  2:01 PM  Result Value Ref Range   Hemoglobin 7.5 (L) 12.0 - 15.0 g/dL   HCT 77.2 (L) 63.9 -  46.0 %    Assessment & Plan: Present on Admission:  Open ankle fracture    LOS: 2 days   Additional comments:I reviewed the patient's new clinical lab test results. And reviewed consultant notes, vitals x 24hrs, labs x 48 hrs  Open R ankle fx, B calcaneus fx, L 2nd metatarsal fx - ortho c/s, Dr. Fidel, handoff to Dr. Kendal, ancef  given in TB, OR 12/12 Dr Kendal - ORIF Rt calcaneus, Closed treatment of left calcaneus  fracture, right lateral malleolus fracture & Closed reduction and percutaneous fixation of left talonavicular joint subluxation, Percutaneous fixation of left navicular fracture - NWB b/l LE-need oral DOAC on dc L SDH - NSGY c/s, Dr. Lanis, keppra  x7d for sz ppx, repeat head CT stable ?L CFV DVT - could be venous swirling, recommend US  to confirm, also ?RLL subsegmental PE. Plan US  LLE Urinary retention - place foley since going to OR today, start flomax  FEN - reg diet Acute blood loss anemia- hgb 11 to 7; repeat cbc in am and maintain type and screen DVT - SCDs, LMWH when cleared by NSGY Dispo - ICU, PT/OT, f/u us  lle  Critical Care Total Time*: 30 Minutes  Camellia Woods. Tanda, MD, FACS General, Bariatric, & Minimally Invasive Surgery [facility]   04/24/2024  *Care during the described time interval was provided by me. I have reviewed this patient's available data, including medical history, events of note, physical examination and test results as part of my evaluation.

## 2024-04-24 NOTE — Progress Notes (Addendum)
 This RN reached out to Trauma MD- Dr. Tanda, to inquire about foley. MD would like us  to keep it for another day. Also notified of HgB trending down from 10.9 to 7.6 on todays AM labs. MD stated that he wanted the Vasc U/S of lower extremities done today as it was supposed to be done yesterda. This RN called number [phone] no anwer, LVM with CB number.

## 2024-04-24 NOTE — Evaluation (Signed)
 Occupational Therapy Evaluation Patient Details Name: Shirley Woods MRN: [id number] DOB: 1941-03-07 Today's Date: 04/24/2024   History of Present Illness   Shirley Woods is an 83 y.o. female who fell out of a 3rd story window trying to escape from memory care unit at [facility]. Upon arrival found to have Bil LE fxs and now is s/p ORIF of right calcaneus and right subtalar joint dislocation; closed reduction of left calcaneus fx, right lateral malleolus fx; closed reduction and percutaneous fixation of left talonavicular joint subluxation; and percutaneous fixation of left navicular fx. 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 PHMx: dementia,     Clinical Impressions This 83 yo female admitted and underwent above presents to acute OT with PLOF of what appears to be fairly mobile based off why she is here but unsure of ADL status. Currently she is setup-total A for basic ADLs and Mod A +2 for safety with bed mobility and lateral scoot transfers. She will continue to benefit from acute OT with follow up from continued inpatient follow up therapy, <3 hours/day.      If plan is discharge home, recommend the following:   A lot of help with walking and/or transfers;A lot of help with bathing/dressing/bathroom;Help with stairs or ramp for entrance;Assist for transportation;Direct supervision/assist for financial management;Direct supervision/assist for medications management     Functional Status Assessment   Patient has had a recent decline in their functional status and demonstrates the ability to make significant improvements in function in a reasonable and predictable amount of time.     Equipment Recommendations   None recommended by OT      Precautions/Restrictions   Precautions Precautions: Fall Recall of Precautions/Restrictions: Impaired Precaution/Restrictions Comments: CAM boots when OOB Restrictions Weight Bearing  Restrictions Per Provider Order: Yes RLE Weight Bearing Per Provider Order: Non weight bearing LLE Weight Bearing Per Provider Order: Non weight bearing Other Position/Activity Restrictions: Per Camie Moores, PA-C on 12/13 via secure chat. That is fine to allow her to have her feet on the ground in the CAM boots to assist with lateral scoot but do not want her trying to stand and transfer. Thanks     Mobility Bed Mobility Overal bed mobility: Needs Assistance Bed Mobility: Supine to Sit     Supine to sit: Mod assist, +2 for safety/equipment Sit to supine: Mod assist        Transfers Overall transfer level: Needs assistance   Transfers: Bed to chair/wheelchair/BSC            Lateral/Scoot Transfers: Mod assist, +2 safety/equipment General transfer comment: constant VCs for technque and to keep scooting      Balance Overall balance assessment: Needs assistance Sitting-balance support: No upper extremity supported, Feet supported Sitting balance-Leahy Scale: Fair                                     ADL either performed or assessed with clinical judgement   ADL Overall ADL's : Needs assistance/impaired Eating/Feeding: Set up;Sitting Eating/Feeding Details (indicate cue type and reason): in recliner Grooming: Set up;Supervision/safety;Sitting Grooming Details (indicate cue type and reason): in recliner Upper Body Bathing: Set up;Supervision/ safety;Sitting Upper Body Bathing Details (indicate cue type and reason): in recliner Lower Body Bathing: Maximal assistance;Bed level   Upper Body Dressing : Minimal assistance;Sitting Upper Body Dressing Details (indicate cue type and reason): in recliner Lower Body  Dressing: Total assistance;Bed level   Toilet Transfer: +2 for safety/equipment;Transfer board;Moderate assistance Toilet Transfer Details (indicate cue type and reason): simulated lateral scoot from bed>recliner without transfer board; increased  time Toileting- Clothing Manipulation and Hygiene: Moderate assistance;Sitting/lateral lean                           Pertinent Vitals/Pain Pain Assessment Pain Assessment: PAINAD Faces Pain Scale: No hurt Breathing: normal Negative Vocalization: none Facial Expression: smiling or inexpressive Body Language: relaxed Consolability: no need to console PAINAD Score: 0 Pain Location: Did report RLE was hurting a little Pain Intervention(s): Limited activity within patient's tolerance, Monitored during session, Repositioned     Extremity/Trunk Assessment Upper Extremity Assessment Upper Extremity Assessment: Generalized weakness;Right hand dominant           Communication Communication Communication: Impaired Factors Affecting Communication: Hearing impaired   Cognition Arousal: Alert Behavior During Therapy: WFL for tasks assessed/performed Cognition: History of cognitive impairments   Orientation impairments: Place, Time, Situation (said she wasn't very good with dates and times) Awareness: Intellectual awareness impaired, Online awareness impaired Memory impairment (select all impairments): Working civil service fast streamer, Copywriter, advertising, Engineer, structural memory Attention impairment (select first level of impairment): Sustained attention Executive functioning impairment (select all impairments): Initiation, Organization, Sequencing, Reasoning, Problem solving                  Following commands: Impaired Following commands impaired: Follows one step commands inconsistently, Follows one step commands with increased time     Cueing  General Comments   Cueing Techniques: Verbal cues;Gestural cues;Tactile cues;Visual cues  VSS on RA           Home Living Family/patient expects to be discharged to:: Assisted living (memory care) Living Arrangements: Other (Comment) (caregivers at memory care SNF) Available Help at Discharge: Personal care  attendant;Available PRN/intermittently (at memory care SNF, someone always in building but not always with her) Type of Home:  (memory care  SNF)            Prior Functioning/Environment Prior Level of Function : Independent/Modified Independent             Mobility Comments: based on why she is here appears she was fairly mobile ADLs Comments: unknown    OT Problem List: Decreased strength;Decreased range of motion;Impaired balance (sitting and/or standing);Decreased cognition;Decreased safety awareness;Pain   OT Treatment/Interventions: Self-care/ADL training;DME and/or AE instruction;Patient/family education;Balance training      OT Goals(Current goals can be found in the care plan section)   Acute Rehab OT Goals Patient Stated Goal: agreeable to get up to recliner OT Goal Formulation: Patient unable to participate in goal setting Time For Goal Achievement: 05/08/24 Potential to Achieve Goals: Fair   OT Frequency:  Min 2X/week       AM-PAC OT 6 Clicks Daily Activity     Outcome Measure Help from another person eating meals?: A Little Help from another person taking care of personal grooming?: A Little Help from another person toileting, which includes using toliet, bedpan, or urinal?: A Lot Help from another person bathing (including washing, rinsing, drying)?: A Lot Help from another person to put on and taking off regular upper body clothing?: A Little Help from another person to put on and taking off regular lower body clothing?: Total 6 Click Score: 14   End of Session Equipment Utilized During Treatment:  (Bil CAM boots) Nurse Communication: Need for lift equipment (for back to bed,  WB'g for lateral transfers, cam boots off in bed)  Activity Tolerance: Patient tolerated treatment well Patient left: in chair;with call bell/phone within reach (chair alarm belt, pt shown how to take it off)  OT Visit Diagnosis: Other abnormalities of gait and mobility  (R26.89);Other symptoms and signs involving cognitive function;Pain Pain - Right/Left: Right Pain - part of body: Leg                Time: 9054-8976 OT Time Calculation (min): 38 min Charges:  OT General Charges $OT Visit: 1 Visit OT Evaluation $OT Eval Moderate Complexity: 1 Mod OT Treatments $Self Care/Home Management : 8-22 mins  Shirley Woods OT Acute Rehabilitation Services Office [phone]    Woods Shirley Distel 04/24/2024, 11:57 AM

## 2024-04-24 NOTE — Progress Notes (Addendum)
 Subjective: Patient reports pain as mild to moderate.  Tolerating diet.  Urinating.   No CP, SOB.  Has not mobilized OOB with PT/OT. Will likely struggle since NWB BLE.  Objective:   VITALS:   Vitals:   04/24/24 0700 04/24/24 0800 04/24/24 0944 04/24/24 1000  BP: (!) 109/58 (!) 116/55 (!) 109/56 (!) 109/57  Pulse: 78 83  85  Resp: (!) 28 18  13   Temp:  98 F (36.7 C)    TempSrc:  Axillary    SpO2: 97% 98%  93%  Weight:      Height:          Latest Ref Rng & Units 04/24/2024    6:40 AM 04/23/2024    3:44 AM 04/22/2024    6:45 PM  CBC  WBC 4.0 - 10.5 K/uL 7.5  10.7    Hemoglobin 12.0 - 15.0 g/dL 7.6  89.0  86.0   Hematocrit 36.0 - 46.0 % 22.5  32.5  41.0   Platelets 150 - 400 K/uL 88  136        Latest Ref Rng & Units 04/24/2024    1:55 AM 04/23/2024    3:44 AM 04/22/2024    6:45 PM  BMP  Glucose 70 - 99 mg/dL 867  870  852   BUN 8 - 23 mg/dL 15  13  11    Creatinine 0.44 - 1.00 mg/dL 9.01  8.94  8.99   Sodium 135 - 145 mmol/L 137  137  140   Potassium 3.5 - 5.1 mmol/L 3.8  3.8  3.5   Chloride 98 - 111 mmol/L 108  106  103   CO2 22 - 32 mmol/L 26  23    Calcium 8.9 - 10.3 mg/dL 8.2  8.2     Intake/Output      12/12 0701 12/13 0700 12/13 0701 12/14 0700   P.O. 360    I.V. (mL/kg) 1091.5 (16.1)    IV Piggyback 311.1    Total Intake(mL/kg) 1762.7 (25.9)    Urine (mL/kg/hr) 775 (0.5) 300 (1.1)   Drains 10    Blood 50    Total Output 835 300   Net +927.7 -300           Physical Exam: General: NAD.  Sitting up in bed, calm, comfortable, eating lunch Resp: No increased wob Cardio: regular rate and rhythm ABD soft Neurologically intact MSK LLE - ACE wrap in place was tight so I rewrapped it looser, edema and ecchymosis seen to L foot and ankle, can wiggle toes, compartments soft, NVI  RLE - ACE wrap in place was also slightly tight so I rewrapped it looser as well, could see the wound vac to the medial portion of the foot/ankle, good seal, edema and  ecchymosis seen to R foot and ankle, can wiggle toes, compartments soft, NVI  Wound vac on and functioning well with <50cc sanguinous fluid in canister and tubing   Assessment: 1 Day Post-Op  S/P Procedures (LRB): OPEN REDUCTION INTERNAL FIXATION (ORIF) CALCANEOUS FRACTURE (Right) PINNING, EXTREMITY, PERCUTANEOUS (Left) IRRIGATION AND DEBRIDEMENT FOOT (Right) by Dr. Kendal on 04/23/24  Principal Problem:   Open ankle fracture    Plan: Hgb decreased from 10.9 ---> 7.6 today  Up with therapy as able Incentive Spirometry Elevate legs and Apply ice PRN  Weightbearing: NWB RLE and LLE Insicional and dressing care: Reinforce dressings as needed Orthopedic device(s): Wound Cjr:mphyu medial foot/ankle and CAM boot Showering: Keep dressing dry, must remain seated  VTE prophylaxis: per neuro , SCDs, ambulation Pain control: PRN, limit narcotics as able d/t dementia - Tylenol  1000mg  q6h - Robaxin  1000mg  q8h - Morphine  1-2 mg IV q4h PRN - Oxycodone  2.5mg  q4h PRN - Tramadol  25-50mg  q6h PRN Follow - up plan: 2 weeks in office with Dr. Kendal Pass information for today:  Ozell Ada MD, Gerard Large PA-C  Dispo: will likely need SNF before returning to her memory care portion of [facility]. Unsure if able to get bed in SNF portion there or will have to go elsewhere.      Gerard CHRISTELLA Large, PA-C Office [phone] 04/24/2024, 11:02 AM

## 2024-04-25 LAB — CBC
HCT: 25.4 % — ABNORMAL LOW (ref 36.0–46.0)
Hemoglobin: 8.3 g/dL — ABNORMAL LOW (ref 12.0–15.0)
MCH: 30 pg (ref 26.0–34.0)
MCHC: 32.7 g/dL (ref 30.0–36.0)
MCV: 91.7 fL (ref 80.0–100.0)
Platelets: 86 K/uL — ABNORMAL LOW (ref 150–400)
RBC: 2.77 MIL/uL — ABNORMAL LOW (ref 3.87–5.11)
RDW: 15.4 % (ref 11.5–15.5)
WBC: 6.2 K/uL (ref 4.0–10.5)
nRBC: 0 % (ref 0.0–0.2)

## 2024-04-25 LAB — TYPE AND SCREEN
ABO/RH(D): A POS
Antibody Screen: NEGATIVE
Unit division: 0

## 2024-04-25 LAB — BPAM RBC
Blood Product Expiration Date: 202601042359
ISSUE DATE / TIME: 202512132359
Unit Type and Rh: 6200

## 2024-04-25 MED ORDER — HEPARIN SODIUM (PORCINE) 5000 UNIT/ML IJ SOLN
5000.0000 [IU] | Freq: Three times a day (TID) | INTRAMUSCULAR | Status: DC
  Administered 2024-04-25 – 2024-05-18 (×67): 5000 [IU] via SUBCUTANEOUS
  Filled 2024-04-25 (×67): qty 1

## 2024-04-25 MED ADMIN — Levetiracetam Tab 500 MG: 500 mg | ORAL | NDC 00904712461

## 2024-04-25 MED ADMIN — Quetiapine Fumarate Tab 50 MG: 50 mg | ORAL | NDC 67877024901

## 2024-04-25 MED ADMIN — Lorazepam Inj 2 MG/ML: 1 mg | INTRAVENOUS | NDC 65219036801

## 2024-04-25 MED ADMIN — Tramadol HCl Tab 50 MG: 50 mg | ORAL | NDC 60687079511

## 2024-04-25 MED ADMIN — Chlorhexidine Gluconate Pads 2%: 6 | TOPICAL | NDC 53462070523

## 2024-04-25 NOTE — Progress Notes (Signed)
 Subjective: Patient sleeping comfortably. Family in room reports she seems to have been in more pain today. Tolerating diet.  Urinating.  No CP, SOB.  Has not mobilized much OOB with PT/OT. Will likely struggle since NWB BLE.  Objective:   VITALS:   Vitals:   04/25/24 0900 04/25/24 1000 04/25/24 1100 04/25/24 1200  BP: (!) 141/65 132/72 132/74 (!) 146/116  Pulse: (!) 104 89 85 86  Resp: (!) 22 (!) 23 (!) 32 15  Temp:    98.9 F (37.2 C)  TempSrc:    Axillary  SpO2: 90% 99% 100% 99%  Weight:      Height:          Latest Ref Rng & Units 04/25/2024    4:03 AM 04/24/2024    7:22 PM 04/24/2024    2:01 PM  CBC  WBC 4.0 - 10.5 K/uL 6.2  7.4    Hemoglobin 12.0 - 15.0 g/dL 8.3  7.0  7.5   Hematocrit 36.0 - 46.0 % 25.4  21.1  22.7   Platelets 150 - 400 K/uL 86  85        Latest Ref Rng & Units 04/24/2024    1:55 AM 04/23/2024    3:44 AM 04/22/2024    6:45 PM  BMP  Glucose 70 - 99 mg/dL 867  870  852   BUN 8 - 23 mg/dL 15  13  11    Creatinine 0.44 - 1.00 mg/dL 9.01  8.94  8.99   Sodium 135 - 145 mmol/L 137  137  140   Potassium 3.5 - 5.1 mmol/L 3.8  3.8  3.5   Chloride 98 - 111 mmol/L 108  106  103   CO2 22 - 32 mmol/L 26  23    Calcium 8.9 - 10.3 mg/dL 8.2  8.2     Intake/Output      12/13 0701 12/14 0700 12/14 0701 12/15 0700   P.O. 240 120   I.V. (mL/kg) 23.3 (0.3)    Blood 315    IV Piggyback 110    Total Intake(mL/kg) 688.3 (10.1) 120 (1.8)   Urine (mL/kg/hr) 1100 (0.7) 0 (0)   Drains 10 0   Blood     Total Output 1110 0   Net -421.7 +120           Physical Exam: General: NAD.  Sitting up in bed sleeping, calm, comfortable Resp: No increased wob Cardio: regular rate and rhythm ABD soft Neurologically intact MSK LLE - ACE wrap in place, edema and ecchymosis seen to L foot and ankle but much better than yesterday, compartments soft, NVI  RLE - ACE wrap in place, wound vac to the medial portion of the foot/ankle, good seal, edema and ecchymosis seen  to R foot and ankle, compartments soft, NVI  Wound vac on and functioning well with <50cc sanguinous fluid in canister and tubing   Assessment: 2 Days Post-Op  S/P Procedures (LRB): OPEN REDUCTION INTERNAL FIXATION (ORIF) CALCANEOUS FRACTURE (Right) PINNING, EXTREMITY, PERCUTANEOUS (Left) IRRIGATION AND DEBRIDEMENT FOOT (Right) by Dr. Kendal on 04/23/24  Principal Problem:   Open ankle fracture    Plan: Hgb 8.3 today  Up with therapy as able Incentive Spirometry Elevate legs and Apply ice PRN  Weightbearing: NWB RLE and LLE Insicional and dressing care: Reinforce dressings as needed Orthopedic device(s): Wound Cjr:mphyu medial foot/ankle and CAM boot Showering: Keep dressing dry, must remain seated VTE prophylaxis: per neuro , SCDs, ambulation Pain control: PRN, limit narcotics  as able d/t dementia - Tylenol  1000mg  q6h - Robaxin  1000mg  q8h - Morphine  1-2 mg IV q4h PRN - Oxycodone  2.5mg  q4h PRN - Tramadol  25-50mg  q6h PRN Follow - up plan: 2 weeks in office with Dr. Kendal Pass information for today:  Ozell Ada MD, Gerard Large PA-C  Dispo: will need SNF before returning to her memory care portion of [facility]. Unsure if able to get bed in SNF portion there or will have to go elsewhere.      Gerard CHRISTELLA Large, PA-C Office [phone] 04/25/2024, 2:24 PM

## 2024-04-25 NOTE — Evaluation (Signed)
 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 female who fell out of a 3rd story window trying to escape from memory care unit at [facility]. Upon arrival found to have Bil LE fxs and now is s/p surgical repair. 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 PHMx: dementia    Assessment / Plan / Recommendation  Clinical Impression  Pt presents with a moderate oral dysphagia which is presumed to be 2/2 somnolence.  Pt was difficult to rouse.  She did open eyes and say hi but was unable to maintain alert state and was noted to snore intermittently during session.  SLP provided oral care prior to administration of PO trials.  Unable to complete OME.  Pt tolerated thin liquid by spoon and straw.  Pt did not attempt to orally manipulate puree and it was removed via suction.  When pt had been more alert at lunch time she consumed meal tray without difficulty per husband.  RN reports waxing and waning.    At present recommend continuing current diet when pt is awake/alert.  SLP to assess additional consistencies when more alert.   SLP Visit Diagnosis: Dysphagia, unspecified (R13.10)    Aspiration Risk  Mild aspiration risk    Diet Recommendation Regular;Thin liquid    Medication Administration:  (As tolerated) Compensations: Slow rate;Small sips/bites Postural Changes: Seated upright at 90 degrees    Other  Recommendations Oral Care Recommendations: Oral care BID     Swallow Evaluation Recommendations  See above   Assistance Recommended at Discharge  N/A  Functional Status Assessment Patient has had a recent decline in their functional status and demonstrates the ability to make significant improvements in function in a reasonable and predictable amount of time.  Frequency and Duration min 2x/week  2 weeks       Prognosis Prognosis for improved oropharyngeal function:  (TBD)      Swallow Study   General Date of Onset: 04/23/24 HPI: Valjean Ruppel Solecki is an 83 y.o. female who fell out of a 3rd story window trying to escape from memory care unit at [facility]. Upon arrival found to have Bil LE fxs and now is s/p surgical repair. 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 PHMx: dementia Type of Study: Bedside Swallow Evaluation Previous Swallow Assessment: None Diet Prior to this Study: Regular;Thin liquids (Level 0) Temperature Spikes Noted: No Respiratory Status: Nasal cannula Behavior/Cognition: Alert;Cooperative Oral Cavity Assessment: Within Functional Limits Oral Care Completed by SLP: Yes Oral Cavity - Dentition: Adequate natural dentition Patient Positioning: Upright in bed Baseline Vocal Quality: Normal Volitional Cough: Cognitively unable to elicit Volitional Swallow: Unable to elicit    Oral/Motor/Sensory Function Overall Oral Motor/Sensory Function:  (Unable to assess)   Ice Chips Ice chips: Impaired Oral Phase Impairments: Impaired mastication   Thin Liquid Thin Liquid: Within functional limits Presentation: Spoon;Straw  Nectar Thick Nectar Thick Liquid: Not tested   Honey Thick Honey Thick Liquid: Not tested   Puree Puree: Impaired Oral Phase Impairments: Poor awareness of bolus Other Comments: removed with suction   Solid     Solid: Not tested      Anette FORBES Grippe, MA, CCC-SLP Acute Rehabilitation Services Office:  [phone] 04/25/2024,4:29 PM

## 2024-04-25 NOTE — Progress Notes (Signed)
 Patient ID: Shirley Woods, female   DOB: December 04, 1940, 83 y.o.   MRN: [id number]   Follow up - Trauma Critical Care  Patient Details:    Shirley Woods is an 83 y.o. female.  Lines/tubes : Negative Pressure Wound Therapy Foot Right (Active)  Site / Wound Assessment Dressing in place / Unable to assess 04/24/24 1930  Cycle Continuous 04/24/24 1930  Target Pressure (mmHg) 125 04/24/24 1930  Machine plugged into wall outlet (NOT bed outlet) Yes 04/24/24 1930  Dressing Status Intact 04/24/24 1930  Drainage Amount None 04/24/24 0800  Output (mL) 10 mL 04/25/24 0600    Microbiology/Sepsis markers: Results for orders placed or performed during the hospital encounter of 04/22/24  MRSA Next Gen by PCR, Nasal     Status: None   Collection Time: 04/22/24 11:24 PM   Specimen: Nasal Mucosa; Nasal Swab  Result Value Ref Range Status   MRSA by PCR Next Gen NOT DETECTED NOT DETECTED Final    Comment: (NOTE) The GeneXpert MRSA Assay (FDA approved for NASAL specimens only), is one component of a comprehensive MRSA colonization surveillance program. It is not intended to diagnose MRSA infection nor to guide or monitor treatment for MRSA infections. Test performance is not FDA approved in patients less than 67 years old. Performed at [facility], [address] [place], [place] [address]   Surgical PCR screen     Status: None   Collection Time: 04/23/24  7:37 AM   Specimen: Nasal Mucosa; Nasal Swab  Result Value Ref Range Status   MRSA, PCR NEGATIVE NEGATIVE Final   Staphylococcus aureus NEGATIVE NEGATIVE Final    Comment: (NOTE) The Xpert SA Assay (FDA approved for NASAL specimens in patients 23 years of age and older), is one component of a comprehensive surveillance program. It is not intended to diagnose infection nor to guide or monitor treatment. Performed at [facility], [address], [place], [place] [address]     Anti-infectives:  Anti-infectives (From  admission, onward)    Start     Dose/Rate Route Frequency Ordered Stop   04/23/24 2000  ceFAZolin  (ANCEF ) IVPB 2g/100 mL premix        2 g 200 mL/hr over 30 Minutes Intravenous Every 8 hours 04/23/24 1509 04/24/24 1259   04/23/24 1259  vancomycin  (VANCOCIN ) powder  Status:  Discontinued          As needed 04/23/24 1259 04/23/24 1338   04/23/24 0915  ceFAZolin  (ANCEF ) IVPB 2g/100 mL premix        2 g 200 mL/hr over 30 Minutes Intravenous To ShortStay Surgical 04/23/24 0858 04/23/24 1304   04/23/24 0907  ceFAZolin  (ANCEF ) 2-4 GM/100ML-% IVPB       Note to Pharmacy: Jonda Yancy HERO: cabinet override      04/23/24 0907 04/23/24 1234   04/22/24 1845  ceFAZolin  (ANCEF ) IVPB 2g/100 mL premix        2 g 200 mL/hr over 30 Minutes Intravenous  Once 04/22/24 1836 04/22/24 1908       Best Practice/Protocols:  N/a N/a  Consults: Treatment Team:  Md, Trauma, MD Haddix, Franky SHAUNNA, MD Lanis Pupa, MD    Studies:    Events:  Subjective:    Overnight Issues:  No acute events Hgb stable Pt just had pain meds Requiring assist with oral intake Pt ripped out foley ovenright Objective:  Vital signs for last 24 hours: Temp:  [97.6 F (36.4 C)-99.4 F (37.4 C)] 97.6 F (  36.4 C) (12/14 0800) Pulse Rate:  [85-121] 88 (12/14 0800) Resp:  [13-25] 17 (12/14 0800) BP: (86-163)/(52-127) 121/65 (12/14 0800) SpO2:  [89 %-100 %] 98 % (12/14 0800)  Hemodynamic parameters for last 24 hours:    Intake/Output from previous day: 12/13 0701 - 12/14 0700 In: 688.3 [P.O.:240; I.V.:23.3; Blood:315; IV Piggyback:110] Out: 1110 [Urine:1100; Drains:10]  Intake/Output this shift: No intake/output data recorded.  Vent settings for last 24 hours:    Physical Exam:  Gen: comfortable, no distress Neuro:asleep, just got pain meds HEENT: PERRL Neck: supple CV: RRR Pulm: unlabored breathing on RA Abd: soft, NT  , no recent BM GU: urine clear and yellow, required I/O cath Extr: wwp, no  edema  Results for orders placed or performed during the hospital encounter of 04/22/24 (from the past 24 hours)  Hemoglobin and hematocrit, blood     Status: Abnormal   Collection Time: 04/24/24  2:01 PM  Result Value Ref Range   Hemoglobin 7.5 (L) 12.0 - 15.0 g/dL   HCT 77.2 (L) 63.9 - 53.9 %  CBC with Differential/Platelet     Status: Abnormal   Collection Time: 04/24/24  7:22 PM  Result Value Ref Range   WBC 7.4 4.0 - 10.5 K/uL   RBC 2.22 (L) 3.87 - 5.11 MIL/uL   Hemoglobin 7.0 (L) 12.0 - 15.0 g/dL   HCT 78.8 (L) 63.9 - 53.9 %   MCV 95.0 80.0 - 100.0 fL   MCH 31.5 26.0 - 34.0 pg   MCHC 33.2 30.0 - 36.0 g/dL   RDW 86.7 88.4 - 84.4 %   Platelets 85 (L) 150 - 400 K/uL   nRBC 0.0 0.0 - 0.2 %   Neutrophils Relative % 72 %   Neutro Abs 5.3 1.7 - 7.7 K/uL   Lymphocytes Relative 16 %   Lymphs Abs 1.2 0.7 - 4.0 K/uL   Monocytes Relative 11 %   Monocytes Absolute 0.8 0.1 - 1.0 K/uL   Eosinophils Relative 0 %   Eosinophils Absolute 0.0 0.0 - 0.5 K/uL   Basophils Relative 0 %   Basophils Absolute 0.0 0.0 - 0.1 K/uL   Immature Granulocytes 1 %   Abs Immature Granulocytes 0.05 0.00 - 0.07 K/uL  Prepare RBC (crossmatch)     Status: None   Collection Time: 04/24/24 10:24 PM  Result Value Ref Range   Order Confirmation      ORDER PROCESSED BY BLOOD BANK Performed at [facility], [address], [place], [place] [address]   Type and screen [facility]     Status: None   Collection Time: 04/24/24 10:43 PM  Result Value Ref Range   ABO/RH(D) A POS    Antibody Screen NEG    Sample Expiration 04/27/2024,2359    Unit Number [id number]    Blood Component Type RED CELLS,LR    Unit division 00    Status of Unit ISSUED,FINAL    Transfusion Status OK TO TRANSFUSE    Crossmatch Result      Compatible Performed at [facility], [address] [place], [place] [address]   CBC     Status: Abnormal   Collection Time: 04/25/24  4:03 AM  Result Value Ref  Range   WBC 6.2 4.0 - 10.5 K/uL   RBC 2.77 (L) 3.87 - 5.11 MIL/uL   Hemoglobin 8.3 (L) 12.0 - 15.0 g/dL   HCT 74.5 (L) 63.9 - 53.9 %   MCV 91.7 80.0 - 100.0 fL  MCH 30.0 26.0 - 34.0 pg   MCHC 32.7 30.0 - 36.0 g/dL   RDW 84.5 88.4 - 84.4 %   Platelets 86 (L) 150 - 400 K/uL   nRBC 0.0 0.0 - 0.2 %    Assessment & Plan: Present on Admission:  Open ankle fracture    LOS: 3 days   Additional comments:I reviewed the patient's new clinical lab test results. And reviewed consultant notes, vitals x 24hrs, labs x 48 hrs  Open R ankle fx, B calcaneus fx, L 2nd metatarsal fx - ortho c/s, Dr. Fidel, handoff to Dr. Kendal, ancef  given in TB, OR 12/12 Dr Kendal - ORIF Rt calcaneus, Closed treatment of left calcaneus fracture, right lateral malleolus fracture & Closed reduction and percutaneous fixation of left talonavicular joint subluxation, Percutaneous fixation of left navicular fracture - NWB b/l LE-need oral DOAC on dc L SDH - NSGY c/s, Dr. Lanis, keppra  x7d for sz ppx, repeat head CT stable ?L CFV DVT - could be venous swirling, recommend US  to confirm, also ?RLL subsegmental PE. US  LLE - negative for dvt Urinary retention - place foley since going to OR today, flomax ; pt removed foley overnight; will bladder scan as needed  FEN - reg diet Acute blood loss anemia- hgb 11 to 7 to 8.3; repeat cbc in am and maintain type and screen DVT - SCDs, LMWH when cleared by NSGY (12/13); start subcu heparin  12/14 Thrombocytopenia- plt stable in 80s, will monitor since starting subcu heparin  Dispo -  PT/OT, will get SLP assessment, ok for progressive  Critical Care Total Time*: 30 Minutes  Camellia HERO. Tanda, MD, FACS General, Bariatric, & Minimally Invasive Surgery [facility]   04/25/2024  *Care during the described time interval was provided by me. I have reviewed this patient's available data, including medical history, events of note, physical  examination and test results as part of my evaluation.

## 2024-04-25 NOTE — Plan of Care (Addendum)
 0831:  Pt bladder scanned.  95 mL 0930:  Dr. Tanda rounded on pt.  See provider note.  1140:  Pt bladder scanned. 126 mL 1350:  Pt bladder scanned. 136 mL.  Pt did have a large unmeasured urine that saturated the pad and leaked onto the fitted sheet.    Problem: Education: Goal: Knowledge of General Education information will improve Description: Including pain rating scale, medication(s)/side effects and non-pharmacologic comfort measures Outcome: Not Progressing   Problem: Health Behavior/Discharge Planning: Goal: Ability to manage health-related needs will improve Outcome: Not Progressing   Problem: Clinical Measurements: Goal: Ability to maintain clinical measurements within normal limits will improve Outcome: Not Progressing Goal: Will remain free from infection Outcome: Not Progressing Goal: Diagnostic test results will improve Outcome: Not Progressing Goal: Respiratory complications will improve Outcome: Not Progressing Goal: Cardiovascular complication will be avoided Outcome: Not Progressing   Problem: Activity: Goal: Risk for activity intolerance will decrease Outcome: Not Progressing   Problem: Nutrition: Goal: Adequate nutrition will be maintained Outcome: Not Progressing   Problem: Coping: Goal: Level of anxiety will decrease Outcome: Not Progressing   Problem: Elimination: Goal: Will not experience complications related to bowel motility Outcome: Not Progressing Goal: Will not experience complications related to urinary retention Outcome: Not Progressing   Problem: Pain Managment: Goal: General experience of comfort will improve and/or be controlled Outcome: Not Progressing   Problem: Safety: Goal: Ability to remain free from injury will improve Outcome: Not Progressing   Problem: Skin Integrity: Goal: Risk for impaired skin integrity will decrease Outcome: Not Progressing   Problem: Safety: Goal: Non-violent Restraint(s) Outcome: Not  Progressing

## 2024-04-26 ENCOUNTER — Encounter (HOSPITAL_COMMUNITY): Payer: Self-pay | Admitting: Student

## 2024-04-26 LAB — BASIC METABOLIC PANEL WITH GFR
Anion gap: 9 (ref 5–15)
BUN: 13 mg/dL (ref 8–23)
CO2: 26 mmol/L (ref 22–32)
Calcium: 8.4 mg/dL — ABNORMAL LOW (ref 8.9–10.3)
Chloride: 105 mmol/L (ref 98–111)
Creatinine, Ser: 0.72 mg/dL (ref 0.44–1.00)
GFR, Estimated: >60 mL/min
Glucose, Bld: 104 mg/dL — ABNORMAL HIGH (ref 70–99)
Potassium: 3.9 mmol/L (ref 3.5–5.1)
Sodium: 140 mmol/L (ref 135–145)

## 2024-04-26 LAB — CBC
HCT: 23.9 % — ABNORMAL LOW (ref 36.0–46.0)
Hemoglobin: 8 g/dL — ABNORMAL LOW (ref 12.0–15.0)
MCH: 30.7 pg (ref 26.0–34.0)
MCHC: 33.5 g/dL (ref 30.0–36.0)
MCV: 91.6 fL (ref 80.0–100.0)
Platelets: 101 K/uL — ABNORMAL LOW (ref 150–400)
RBC: 2.61 MIL/uL — ABNORMAL LOW (ref 3.87–5.11)
RDW: 15.2 % (ref 11.5–15.5)
WBC: 5.2 K/uL (ref 4.0–10.5)
nRBC: 0 % (ref 0.0–0.2)

## 2024-04-26 MED ORDER — VITAMIN D 25 MCG (1000 UNIT) PO TABS
2000.0000 [IU] | ORAL_TABLET | Freq: Every day | ORAL | Status: DC
  Administered 2024-04-26 – 2024-05-18 (×21): 2000 [IU] via ORAL
  Filled 2024-04-26 (×23): qty 2

## 2024-04-26 MED ORDER — SENNA 8.6 MG PO TABS
1.0000 | ORAL_TABLET | Freq: Every day | ORAL | Status: DC
  Administered 2024-04-26 – 2024-05-16 (×18): 8.6 mg via ORAL
  Filled 2024-04-26 (×20): qty 1

## 2024-04-26 MED ORDER — POLYETHYLENE GLYCOL 3350 17 G PO PACK
17.0000 g | PACK | Freq: Every day | ORAL | Status: DC
  Administered 2024-04-27 – 2024-04-29 (×3): 17 g via ORAL
  Filled 2024-04-26 (×3): qty 1

## 2024-04-26 MED ADMIN — Levetiracetam Tab 500 MG: 500 mg | ORAL | NDC 51079082101

## 2024-04-26 MED ADMIN — Levetiracetam Tab 500 MG: 500 mg | ORAL | NDC 00904712461

## 2024-04-26 MED ADMIN — Quetiapine Fumarate Tab 50 MG: 50 mg | ORAL | NDC 67877024901

## 2024-04-26 MED ADMIN — Lorazepam Inj 2 MG/ML: 1 mg | INTRAVENOUS | NDC 65219036801

## 2024-04-26 MED ADMIN — Tramadol HCl Tab 50 MG: 50 mg | ORAL | NDC 60687079511

## 2024-04-26 MED ADMIN — Chlorhexidine Gluconate Pads 2%: 6 | TOPICAL | NDC 53462070523

## 2024-04-26 NOTE — Plan of Care (Signed)
 0820:  Ortho PA rounded on pt.  See provider note.  Problem: Education: Goal: Knowledge of General Education information will improve Description: Including pain rating scale, medication(s)/side effects and non-pharmacologic comfort measures Outcome: Not Progressing   Problem: Health Behavior/Discharge Planning: Goal: Ability to manage health-related needs will improve Outcome: Not Progressing   Problem: Clinical Measurements: Goal: Ability to maintain clinical measurements within normal limits will improve Outcome: Not Progressing Goal: Will remain free from infection Outcome: Not Progressing Goal: Diagnostic test results will improve Outcome: Not Progressing Goal: Respiratory complications will improve Outcome: Not Progressing Goal: Cardiovascular complication will be avoided Outcome: Not Progressing   Problem: Activity: Goal: Risk for activity intolerance will decrease Outcome: Not Progressing   Problem: Nutrition: Goal: Adequate nutrition will be maintained Outcome: Not Progressing   Problem: Coping: Goal: Level of anxiety will decrease Outcome: Not Progressing   Problem: Elimination: Goal: Will not experience complications related to bowel motility Outcome: Not Progressing Goal: Will not experience complications related to urinary retention Outcome: Not Progressing   Problem: Pain Managment: Goal: General experience of comfort will improve and/or be controlled Outcome: Not Progressing   Problem: Safety: Goal: Ability to remain free from injury will improve Outcome: Not Progressing   Problem: Skin Integrity: Goal: Risk for impaired skin integrity will decrease Outcome: Not Progressing   Problem: Safety: Goal: Non-violent Restraint(s) Outcome: Not Progressing

## 2024-04-26 NOTE — Progress Notes (Signed)
 Patient ID: Shirley Woods, female   DOB: 05/16/1940, 83 y.o.   MRN: [id number] Follow up - Trauma Critical Care   Patient Details:    Shirley Woods is an 83 y.o. female.  Lines/tubes : Negative Pressure Wound Therapy Foot Right (Active)  Site / Wound Assessment Dressing in place / Unable to assess 04/25/24 1945  Cycle Continuous 04/25/24 1945  Target Pressure (mmHg) 125 04/25/24 1945  Machine plugged into wall outlet (NOT bed outlet) Yes 04/25/24 1945  Dressing Status Intact 04/25/24 1945  Drainage Amount None 04/25/24 1945  Output (mL) 20 mL 04/26/24 0530    Microbiology/Sepsis markers: Results for orders placed or performed during the hospital encounter of 04/22/24  MRSA Next Gen by PCR, Nasal     Status: None   Collection Time: 04/22/24 11:24 PM   Specimen: Nasal Mucosa; Nasal Swab  Result Value Ref Range Status   MRSA by PCR Next Gen NOT DETECTED NOT DETECTED Final    Comment: (NOTE) The GeneXpert MRSA Assay (FDA approved for NASAL specimens only), is one component of a comprehensive MRSA colonization surveillance program. It is not intended to diagnose MRSA infection nor to guide or monitor treatment for MRSA infections. Test performance is not FDA approved in patients less than 26 years old. Performed at [facility], [address] [place], [place] [address]   Surgical PCR screen     Status: None   Collection Time: 04/23/24  7:37 AM   Specimen: Nasal Mucosa; Nasal Swab  Result Value Ref Range Status   MRSA, PCR NEGATIVE NEGATIVE Final   Staphylococcus aureus NEGATIVE NEGATIVE Final    Comment: (NOTE) The Xpert SA Assay (FDA approved for NASAL specimens in patients 38 years of age and older), is one component of a comprehensive surveillance program. It is not intended to diagnose infection nor to guide or monitor treatment. Performed at [facility], [address] [place], [place] [address]     Anti-infectives:  Anti-infectives (From  admission, onward)    Start     Dose/Rate Route Frequency Ordered Stop   04/23/24 2000  ceFAZolin  (ANCEF ) IVPB 2g/100 mL premix        2 g 200 mL/hr over 30 Minutes Intravenous Every 8 hours 04/23/24 1509 04/24/24 1259   04/23/24 1259  vancomycin  (VANCOCIN ) powder  Status:  Discontinued          As needed 04/23/24 1259 04/23/24 1338   04/23/24 0915  ceFAZolin  (ANCEF ) IVPB 2g/100 mL premix        2 g 200 mL/hr over 30 Minutes Intravenous To ShortStay Surgical 04/23/24 0858 04/23/24 1304   04/23/24 0907  ceFAZolin  (ANCEF ) 2-4 GM/100ML-% IVPB       Note to Pharmacy: Jonda Yancy HERO: cabinet override      04/23/24 0907 04/23/24 1234   04/22/24 1845  ceFAZolin  (ANCEF ) IVPB 2g/100 mL premix        2 g 200 mL/hr over 30 Minutes Intravenous  Once 04/22/24 1836 04/22/24 1908      Consults: Treatment Team:  Md, Felicia, MD Haddix, Franky SHAUNNA, MD Lanis Pupa, MD    Studies:    Events:  Subjective:    Overnight Issues:   Objective:  Vital signs for last 24 hours: Temp:  [97.6 F (36.4 C)-98.9 F (37.2 C)] 97.6 F (36.4 C) (12/15 0000) Pulse Rate:  [82-111] 85 (12/15 0600) Resp:  [14-32] 14 (12/15 0600) BP: (112-169)/(48-116) 130/69 (12/15 0600) SpO2:  [90 %-100 %]  100 % (12/15 0600)  Hemodynamic parameters for last 24 hours:    Intake/Output from previous day: 12/14 0701 - 12/15 0700 In: 240 [P.O.:240] Out: 20 [Drains:20]  Intake/Output this shift: No intake/output data recorded.  Vent settings for last 24 hours:    Physical Exam:  General: no respiratory distress Neuro: dysarthric speech but F/C UE and LE Resp: clear to auscultation bilaterally CVS: RRR GI: soft, nontender, BS WNL, no r/g Extremities: BLE ankle ace/ortho dressings, toea warm  Results for orders placed or performed during the hospital encounter of 04/22/24 (from the past 24 hours)  CBC     Status: Abnormal   Collection Time: 04/26/24  5:16 AM  Result Value Ref Range   WBC 5.2 4.0 - 10.5  K/uL   RBC 2.61 (L) 3.87 - 5.11 MIL/uL   Hemoglobin 8.0 (L) 12.0 - 15.0 g/dL   HCT 76.0 (L) 63.9 - 53.9 %   MCV 91.6 80.0 - 100.0 fL   MCH 30.7 26.0 - 34.0 pg   MCHC 33.5 30.0 - 36.0 g/dL   RDW 84.7 88.4 - 84.4 %   Platelets 101 (L) 150 - 400 K/uL   nRBC 0.0 0.0 - 0.2 %  Basic metabolic panel     Status: Abnormal   Collection Time: 04/26/24  5:16 AM  Result Value Ref Range   Sodium 140 135 - 145 mmol/L   Potassium 3.9 3.5 - 5.1 mmol/L   Chloride 105 98 - 111 mmol/L   CO2 26 22 - 32 mmol/L   Glucose, Bld 104 (H) 70 - 99 mg/dL   BUN 13 8 - 23 mg/dL   Creatinine, Ser 9.27 0.44 - 1.00 mg/dL   Calcium 8.4 (L) 8.9 - 10.3 mg/dL   GFR, Estimated >39 >39 mL/min   Anion gap 9 5 - 15    Assessment & Plan: Present on Admission:  Open ankle fracture    LOS: 4 days   Additional comments:I reviewed the patient's new clinical lab test results. / Jump from 3rd floor window at memory care  Open R ankle fx, B calcaneus fx, L 2nd metatarsal fx - ortho c/s, Dr. Fidel, handoff to Dr. Kendal, ancef  given in TB, OR 12/12 Dr Kendal - ORIF Rt calcaneus, Closed treatment of left calcaneus fracture, right lateral malleolus fracture & Closed reduction and percutaneous fixation of left talonavicular joint subluxation, Percutaneous fixation of left navicular fracture - NWB b/l LE-need oral DOAC on dc L SDH - NSGY c/s, Dr. Lanis, keppra  x7d for sz ppx, repeat head CT stable ?L CFV DVT - could be venous swirling, recommend US  to confirm, also ?RLL subsegmental PE. US  LLE - negative for dvt Urinary retention - place foley since going to OR today, flomax ; pt removed foley overnight; will bladder scan as needed  FEN - reg diet Acute blood loss anemia- hgb 11 to 7 to 8.3 to 8 today, PLTs 101k DVT - SCDs, started subcu heparin  12/14, DOAC on D/C per Ortho Trauma Thrombocytopenia- up to 101k Dispo -  PT/OT, eating well, transfer to progressive Critical Care Total Time*: 34 Minutes  Dann Hummer, MD,  MPH, FACS Trauma & General Surgery Use [email or web address] to contact on call provider  04/26/2024  *Care during the described time interval was provided by me. I have reviewed this patient's available data, including medical history, events of note, physical examination and test results as part of my evaluation.

## 2024-04-26 NOTE — NC FL2 (Addendum)
 [place]  MEDICAID [id number] LEVEL OF CARE FORM     IDENTIFICATION  Patient Name: Shirley Woods Birthdate: 02/20/1941 Sex: female Admission Date (Current Location): 04/22/2024  [facility] and [place] Number:  Producer, Television/film/video and Address:  [facility], [address] [place], [place] [address]      Provider Number: [id number]  Attending Physician Name and Address:  Dr. Dann Hummer  Relative Name and Phone Number:  Dorce,Sheri  Daughter, Emergency Contact  [phone] (Mobile)    Current Level of Care: Hospital Recommended Level of Care: Skilled Nursing Facility Prior Approval Number:    Date Approved/Denied:   PASRR Number: pending  Discharge Plan:      Current Diagnoses: Patient Active Problem List   Diagnosis Date Noted   Open ankle fracture 04/22/2024  Anxiety  Orientation RESPIRATION BLADDER Height & Weight     Self  O2 (2L) Continent Weight: 150 lb (68 kg) Height:  5' 2 (157.5 cm)  BEHAVIORAL SYMPTOMS/MOOD NEUROLOGICAL BOWEL NUTRITION STATUS  Wanderer   Continent Diet (reg)  AMBULATORY STATUS COMMUNICATION OF NEEDS Skin   Extensive Assist Verbally Wound Vac, Other (Comment), Bruising (surgicial incision R leg & foot - wound vac, surgical incision L leg - compression wrap)                       Personal Care Assistance Level of Assistance  Bathing, Feeding, Dressing Bathing Assistance: Maximum assistance Feeding assistance: Maximum assistance Dressing Assistance: Maximum assistance     Functional Limitations Info             SPECIAL CARE FACTORS FREQUENCY  PT (By licensed PT), OT (By licensed OT), Speech therapy     PT Frequency: 5 times per week OT Frequency: 5 times per week     Speech Therapy Frequency: 2 times per week      Contractures      Additional Factors Info  Code Status, Allergies Code Status Info: full Allergies Info: nka           Current Medications (04/26/2024):  This is the  current hospital active medication list Current Facility-Administered Medications  Medication Dose Route Frequency Provider Last Rate Last Admin   acetaminophen  (TYLENOL ) tablet 1,000 mg  1,000 mg Oral Q6H Tanda Locus, MD   1,000 mg at 04/26/24 9462   amLODipine  (NORVASC ) tablet 5 mg  5 mg Oral Daily Tanda Locus, MD   5 mg at 04/26/24 0932   Chlorhexidine  Gluconate Cloth 2 % PADS 6 each  6 each Topical Daily Tanda Locus, MD   6 each at 04/26/24 0932   cholecalciferol  (VITAMIN D3) 25 MCG (1000 UNIT) tablet 2,000 Units  2,000 Units Oral Daily Danton Lauraine LABOR, PA-C   2,000 Units at 04/26/24 0932   docusate sodium  (COLACE) capsule 100 mg  100 mg Oral BID Tanda Locus, MD   100 mg at 04/26/24 0932   donepezil  (ARICEPT ) tablet 5 mg  5 mg Oral QPM Tanda Locus, MD   5 mg at 04/25/24 1751   feeding supplement (ENSURE PLUS HIGH PROTEIN) liquid 237 mL  237 mL Oral BID BM Tanda Locus, MD   237 mL at 04/26/24 0933   heparin  injection 5,000 Units  5,000 Units Subcutaneous Q8H Wilson, Eric, MD   5,000 Units at 04/26/24 9462   hydrALAZINE  (APRESOLINE ) injection 10 mg  10 mg Intravenous Q2H PRN Tanda Locus, MD       lamoTRIgine  (LAMICTAL ) tablet 75 mg  75 mg Oral QHS Tanda Locus, MD   75 mg at 04/25/24 2228   levETIRAcetam  (KEPPRA ) tablet 500 mg  500 mg Oral BID Tanda Locus, MD   500 mg at 04/26/24 0932   LORazepam  (ATIVAN ) injection 1 mg  1 mg Intravenous Q4H PRN Tanda Locus, MD   1 mg at 04/25/24 0915   methocarbamol  (ROBAXIN ) tablet 1,000 mg  1,000 mg Oral Q8H Wilson, Eric, MD   1,000 mg at 04/26/24 9462   metoCLOPramide  (REGLAN ) tablet 5-10 mg  5-10 mg Oral Q8H PRN Tanda Locus, MD       Or   metoCLOPramide  (REGLAN ) injection 5-10 mg  5-10 mg Intravenous Q8H PRN Tanda Locus, MD       metoprolol  tartrate (LOPRESSOR ) injection 5 mg  5 mg Intravenous Q6H PRN Tanda Locus, MD       morphine  (PF) 2 MG/ML injection 1-2 mg  1-2 mg Intravenous Q4H PRN Tanda Locus, MD   2 mg at 04/22/24 2346   ondansetron   (ZOFRAN -ODT) disintegrating tablet 4 mg  4 mg Oral Q6H PRN Tanda Locus, MD       Or   ondansetron  (ZOFRAN ) injection 4 mg  4 mg Intravenous Q6H PRN Tanda Locus, MD   4 mg at 04/22/24 1933   Oral care mouth rinse  15 mL Mouth Rinse PRN Tanda Locus, MD       oxyCODONE  (Oxy IR/ROXICODONE ) immediate release tablet 2.5 mg  2.5 mg Oral Q4H PRN Tanda Locus, MD   2.5 mg at 04/25/24 0854   polyethylene glycol (MIRALAX  / GLYCOLAX ) packet 17 g  17 g Oral Daily PRN Tanda Locus, MD   17 g at 04/25/24 1338   QUEtiapine  (SEROQUEL ) tablet 50 mg  50 mg Oral BID Tanda Locus, MD   50 mg at 04/26/24 0932   sertraline  (ZOLOFT ) tablet 100 mg  100 mg Oral QPM Tanda Locus, MD   100 mg at 04/25/24 1752   tamsulosin  (FLOMAX ) capsule 0.4 mg  0.4 mg Oral Daily Tanda Locus, MD   0.4 mg at 04/26/24 0932   traMADol  (ULTRAM ) tablet 25-50 mg  25-50 mg Oral Q6H PRN Tanda Locus, MD   50 mg at 04/26/24 9695     Discharge Medications: Please see discharge summary for a list of discharge medications.  Relevant Imaging Results:  Relevant Lab Results:   Additional Information SS #: [id number]  Talicia Sui E Nuel Dejaynes, LCSW

## 2024-04-26 NOTE — TOC PASRR Note (Addendum)
 [facility] IP TOC PASRR NOTE  RE: Shirley Woods Date of Birth:  03/28/1941 Date:04/26/24   To Whom It May Concern:  -Please be advised that the above-named patient will require a short-term nursing home stay - anticipated 30 days or less for rehabilitation and strengthening.  The plan is for return home.  -Please be advised that the above-name patient's dementia diagnosis is primary and supersedes mental illness.

## 2024-04-26 NOTE — Plan of Care (Signed)

## 2024-04-26 NOTE — TOC Progression Note (Addendum)
 Transition of Care [facility]) - Progression Note    Patient Details  Name: Shirley Woods MRN: [id number] Date of Birth: 12/01/1940  [facility]) CM/SW Contact  Carmelita FORBES Carbon, LCSW Phone Number: 04/26/2024, 9:24 AM  Clinical Narrative:    Therapy recommends SNF. Spoke with Brittany at [facility] who confirms patient can return to their SNF level of care when ready. Checked with MD- patient will be ready in the next few days.  CSW called patient's daughter after rounds who states she prefers patient to not return to [facility], would prefer another SNF for STR. She is aware they would need to make arrangements for LTC after rehab. She is aware of limited locked SNF units for STR. She confirms patient does wander. CSW will send out to other SNFs and follow for bed offers.   PASRR IS PENDING. - additional info uploaded     Expected Discharge Plan: Skilled Nursing Facility Barriers to Discharge: Continued Medical Work up               Expected Discharge Plan and Services   Discharge Planning Services: CM Consult   Living arrangements for the past 2 months: Assisted Living Facility (Memory Care)                                       Social Drivers of Health (SDOH) Interventions SDOH Screenings   Food Insecurity: No Food Insecurity (04/23/2024)  Housing: Low Risk (04/23/2024)  Transportation Needs: No Transportation Needs (04/23/2024)  Utilities: Not At Risk (04/23/2024)  Social Connections: Patient Unable To Answer (04/23/2024)    Readmission Risk Interventions     No data to display

## 2024-04-26 NOTE — Evaluation (Signed)
 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 while vacationing in [place]   Dementia [facility])    Hypertension    Past Surgical History:  Past Surgical History:  Procedure Laterality Date   ABDOMINAL HYSTERECTOMY     APPENDECTOMY     IRRIGATION AND DEBRIDEMENT FOOT Right 04/23/2024   Procedure: IRRIGATION AND DEBRIDEMENT FOOT;  Surgeon: Kendal Franky SHAUNNA, MD;  Location: [facility] OR;  Service: Orthopedics;  Laterality: Right;   ORIF CALCANEOUS FRACTURE Right 04/23/2024   Procedure: OPEN REDUCTION INTERNAL FIXATION (ORIF) CALCANEOUS FRACTURE;  Surgeon: Kendal Franky SHAUNNA, MD;  Location: [facility] OR;  Service: Orthopedics;  Laterality: Right;   PERCUTANEOUS PINNING Left 04/23/2024   Procedure: PINNING, EXTREMITY, PERCUTANEOUS;  Surgeon: Kendal Franky SHAUNNA, MD;  Location: [facility] OR;  Service: Orthopedics;  Laterality: Left;   HPI:  Shirley Woods is an 83 y.o. female who fell out of a 3rd story window trying to escape from memory care unit at [facility]. Upon arrival found to have Bil LE fxs and now is s/p surgical repair. 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 PHMx: dementia   Assessment / Plan / Recommendation Clinical Impression  Patient presents with cognitive deficits in the areas of attention, awareness, memory and problem solving. Language intact including reading comprehension. No family present to determine current function vs baseline. Benefit of cognition specific SLP services low at this time given degree of baseline dementia. Recommend full supervision following d/c. If plan continues to be home after short  term SNF placement, brief SLP services at SNF to assist husband with compensatory strategies to faciliate safety with basic ADLs may be beneficial prior to d/c home.    SLP Assessment  SLP Recommendation/Assessment: Patient does not need any further Speech Language Pathology Services SLP Visit Diagnosis: Attention and concentration deficit     Assistance Recommended at Discharge  Frequent or constant Supervision/Assistance  Functional Status Assessment Patient has had a recent decline in their functional status and/or demonstrates limited ability to make significant improvements in function in a reasonable and predictable amount of time  Frequency and Duration           SLP Evaluation Cognition  Overall Cognitive Status: No family/caregiver present to determine baseline cognitive functioning Arousal/Alertness: Awake/alert Orientation Level: Oriented to person;Oriented to situation;Oriented to place;Disoriented to time Attention: Sustained Sustained Attention: Impaired Sustained Attention Impairment: Functional basic Memory: Impaired Memory Impairment: Storage deficit;Retrieval deficit;Decreased short term memory;Decreased long term memory Decreased Long Term Memory: Verbal basic Decreased Short Term Memory: Verbal basic Awareness: Impaired Awareness Impairment: Intellectual impairment Problem Solving: Impaired Problem Solving Impairment: Verbal basic;Functional basic Safety/Judgment: Impaired       Comprehension  Auditory Comprehension Overall Auditory Comprehension: Appears within functional limits for tasks assessed (for basic commands and yes no questions) Reading Comprehension Reading Status: Within funtional limits    Expression Expression Primary Mode of Expression: Verbal Verbal Expression Overall Verbal Expression: Appears within functional limits for tasks assessed   Oral / Motor  Oral Motor/Sensory Function Overall Oral Motor/Sensory Function: Within functional  limits Motor Speech Overall Motor Speech: Appears within functional limits for tasks assessed  Chriss Mannan [name] 04/26/2024, 11:04 AM

## 2024-04-26 NOTE — Progress Notes (Signed)
 Orthopaedic Trauma Progress Note  SUBJECTIVE: Doing ok today, pleasantly confused.  Denies any significant pain at rest.  Does not recall events that led to her hospitalization.  Plan is for transfers to progressive unit today. No family at bedside currently.  OBJECTIVE:  Vitals:   04/26/24 0600 04/26/24 0800  BP: 130/69   Pulse: 85   Resp: 14   Temp:  98.3 F (36.8 C)  SpO2: 100%     Opiates Today (MME): Today's  total administered Morphine  Milligram Equivalents: 5 Opiates Yesterday (MME): Yesterday's total administered Morphine  Milligram Equivalents: 13.75  General: Resting in bed comfortably, no acute distress Respiratory: No increased work of breathing.  RLE: Dressing clean, dry, intact.  Incisional VAC with good seal and function.  50 mL bloody output in canister.  Able to wiggle the toes slightly.  Toes warm and well-perfused LLE: Dressing clean, dry, intact.  Notable bruising to the calf.  No significant tenderness over this area.  No significant increase in pain with passive stretch of the toes.  Toes warm and well-perfused wiggle toes slightly  IMAGING: Stable post op imaging right calcaneus and left ankle  LABS:  Results for orders placed or performed during the hospital encounter of 04/22/24 (from the past 24 hours)  CBC     Status: Abnormal   Collection Time: 04/26/24  5:16 AM  Result Value Ref Range   WBC 5.2 4.0 - 10.5 K/uL   RBC 2.61 (L) 3.87 - 5.11 MIL/uL   Hemoglobin 8.0 (L) 12.0 - 15.0 g/dL   HCT 76.0 (L) 63.9 - 53.9 %   MCV 91.6 80.0 - 100.0 fL   MCH 30.7 26.0 - 34.0 pg   MCHC 33.5 30.0 - 36.0 g/dL   RDW 84.7 88.4 - 84.4 %   Platelets 101 (L) 150 - 400 K/uL   nRBC 0.0 0.0 - 0.2 %  Basic metabolic panel     Status: Abnormal   Collection Time: 04/26/24  5:16 AM  Result Value Ref Range   Sodium 140 135 - 145 mmol/L   Potassium 3.9 3.5 - 5.1 mmol/L   Chloride 105 98 - 111 mmol/L   CO2 26 22 - 32 mmol/L   Glucose, Bld 104 (H) 70 - 99 mg/dL   BUN 13 8 - 23  mg/dL   Creatinine, Ser 9.27 0.44 - 1.00 mg/dL   Calcium 8.4 (L) 8.9 - 10.3 mg/dL   GFR, Estimated >39 >39 mL/min   Anion gap 9 5 - 15    ASSESSMENT: Shirley Woods is a 83 y.o. female, 3 Days Post-Op s/p jump from third floor window at memory care unit  Procedures:  ORIF RIGHT CALCANEUS FRACTURE OPEN REDUCTION OF RIGHT SUBTALAR JOINT DISLOCATION IRRIGATION AND DEBRIDEMENT OF RIGHT OPEN CALCANEUS FRACTURE CLOSED TREATMENT OF LEFT CALCANEUS FRACTURE CLOSED TREATMENT OF RIGHT LATERAL MALLEOLUS FRACTURE CLOSED REDUCTION AND PERCUTANEOUS FIXATION OF LEFT TALONAVICULAR JOINT SUBLUXATION PERCUTANEOUS FIXATION OF LEFT NAVICULAR FRACTURE  CV/Blood loss: Acute blood loss anemia, Hgb 8.0 this AM. Hemodynamically stable.  Received 1 unit PRBCs on 04/24/2024  PLAN: Weightbearing: NWB RLE and LLE.  Okay to have BLE resting on the ground in cam boots to assist with transfers.  Do not want her standing on either leg ROM: Okay for ankle ROM as tolerated Incisional and dressing care: Maintain wound VAC RLE.  Change dressings as needed Showering: Okay for bed bath Orthopedic device(s): Wound Vac: RLE. Cam boot to BLE when OOB Pain management: Continue current regimen.  Limit narcotics  as able VTE prophylaxis: Heparin   ID:  Ancef  2gm post op has been completed Foley/Lines:  No foley, KVO IVFs Impediments to Fracture Healing: Vitamin D  level low normal at 31.  Will start on supplementation Dispo: PT/OT evaluation ongoing.  Transferred to progressive unit today per trauma team.  Ortho issues stable.    D/C recommendations: - Eliquis 2.5 mg twice daily x 30 days for DVT prophylaxis - Continue 2000 units Vit D supplementation daily x 90 days  Follow - up plan: 2 weeks after d/c for wound check and repeat x-rays   Contact information:  Franky Light MD, Lauraine Moores PA-C. After hours and holidays please check [email or web address] for group call information for [facility]   Lauraine PATRIC Moores,  PA-C [phone] (office) [email or web address]

## 2024-04-26 NOTE — Progress Notes (Signed)
 Speech Language Pathology Treatment: Dysphagia  Patient Details Name: Shirley Woods MRN: [id number] DOB: Dec 08, 1940 Today's Date: 04/26/2024 Time: 8974-8966 SLP Time Calculation (min) (ACUTE ONLY): 8 min  Assessment / Plan / Recommendation Clinical Impression  Patient seen for diagnostic SLP dysphagia treatment given limitations of po trials during initial evaluation. Patient alert and cooperative. Per RN, consumed 100% of am meal. Patient able to self feed trials of pureed and regular texture solids and thin liquids without overt s/s of aspiration, encouragement needed to continue po intake given decreased sustained attention. At this time, patient without indication of dysphagia. Regular diet, thin liquids remains appropriate. No further SLP needs indicated.    HPI HPI: Shirley Woods is an 83 y.o. female who fell out of a 3rd story window trying to escape from memory care unit at [facility]. Upon arrival found to have Bil LE fxs and now is s/p surgical repair. 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 PHMx: dementia      SLP Plan  All goals met  Patient does not need any further Speech Language Pathology Services     Swallow Evaluation Recommendations   Recommendations: PO diet PO Diet Recommendation: Regular;Thin liquids (Level 0) Liquid Administration via: Cup;Straw Medication Administration: Whole meds with liquid Supervision: Patient able to self-feed Postural changes: Position pt fully upright for meals Oral care recommendations: Oral care BID (2x/day)     Recommendations                    Oral care BID   Frequent or constant Supervision/Assistance Attention and concentration deficit     All goals met    Shirley Pass MA, CCC-SLP  Shirley Woods Shirley Woods  04/26/2024, 11:07 AM

## 2024-04-26 NOTE — Progress Notes (Signed)
 Physical Therapy Treatment Patient Details Name: Shirley Woods MRN: [id number] DOB: 1940/06/10 Today's Date: 04/26/2024   History of Present Illness Shirley Woods is an 83 y.o. female who fell out of a 3rd story window trying to escape from memory care unit at [facility]. Upon arrival found to have Bil LE fxs and now is s/p ORIF of right calcaneus and right subtalar joint dislocation; closed reduction of left calcaneus fx, right lateral malleolus fx; closed reduction and percutaneous fixation of left talonavicular joint subluxation; and percutaneous fixation of left navicular fx. 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 PHMx: dementia,    PT Comments  Pt pleasant and cooperative. Pt confused with decreased insight to deficits, bilat LE NWB, and situation. Pt initiated transfer to EOB well and was able to move bilat LEs however due to inability to maintain bilat LE NWB pt maxAX2 for lateral scoot to drop arm recliner. Pt to benefit from inpatient rehab program < 3 hrs a day to allow for increased time to heal and achieve indep with lateral scoot transfers. Acute PT to cont to follow.    If plan is discharge home, recommend the following: A lot of help with walking and/or transfers;A lot of help with bathing/dressing/bathroom;Assistance with cooking/housework;Direct supervision/assist for medications management;Direct supervision/assist for financial management;Assist for transportation;Help with stairs or ramp for entrance;Supervision due to cognitive status   Can travel by private vehicle     No  Equipment Recommendations  Other (comment);None recommended by PT (TBA at a later venue)    Recommendations for Other Services       Precautions / Restrictions Precautions Precautions: Fall Recall of Precautions/Restrictions: Impaired Precaution/Restrictions Comments: CAM boots when OOB Restrictions Weight Bearing Restrictions Per Provider Order:  Yes RLE Weight Bearing Per Provider Order: Non weight bearing LLE Weight Bearing Per Provider Order: Non weight bearing Other Position/Activity Restrictions: Per Camie Moores, PA-C on 12/13 via secure chat. That is fine to allow her to have her feet on the ground in the CAM boots to assist with lateral scoot but do not want her trying to stand and transfer. Thanks     Mobility  Bed Mobility Overal bed mobility: Needs Assistance Bed Mobility: Supine to Sit     Supine to sit: Mod assist, HOB elevated, Used rails     General bed mobility comments: pt able to move LEs off EOB, verbal cues to not push up from bilat feet, verbal cues to use hand rail, modA for trunk elevation    Transfers Overall transfer level: Needs assistance   Transfers: Bed to chair/wheelchair/BSC            Lateral/Scoot Transfers: +2 safety/equipment, Max assist General transfer comment: constant VCs for technque and to keep scooting.  Pt wanting to put feet down on the ground, ultimately maxAX2 via bed pad to scoot pt fully over to chair    Ambulation/Gait                   Stairs             Wheelchair Mobility     Tilt Bed    Modified Rankin (Stroke Patients Only)       Balance Overall balance assessment: Needs assistance Sitting-balance support: No upper extremity supported, Feet supported Sitting balance-Leahy Scale: Fair         Standing balance comment: unable to stand  Communication Communication Communication: Impaired Factors Affecting Communication: Hearing impaired  Cognition Arousal: Alert Behavior During Therapy: WFL for tasks assessed/performed   PT - Cognitive impairments: History of cognitive impairments                       PT - Cognition Comments: pt pleasant and eager to mobilize. due to dementia pt with no recall of incident, not oriented to hospital or able to recall after re-orientation, pt was  able to follow simple commands, max verbal cues to maintain bilat LE NWB Following commands: Impaired Following commands impaired: Follows one step commands inconsistently, Follows one step commands with increased time    Cueing Cueing Techniques: Verbal cues, Gestural cues, Tactile cues, Visual cues  Exercises Other Exercises Other Exercises: several reps bilat LAQ while EOB, no difficulty    General Comments General comments (skin integrity, edema, etc.): VSS on RA, noted significant bruising on L lower leg      Pertinent Vitals/Pain Pain Assessment Pain Assessment: Faces Faces Pain Scale: No hurt    Home Living                          Prior Function            PT Goals (current goals can now be found in the care plan section) Acute Rehab PT Goals Patient Stated Goal: pt unable PT Goal Formulation: Patient unable to participate in goal setting Time For Goal Achievement: 05/08/24 Potential to Achieve Goals: Good Progress towards PT goals: Progressing toward goals    Frequency    Min 3X/week      PT Plan      Co-evaluation              AM-PAC PT 6 Clicks Mobility   Outcome Measure  Help needed turning from your back to your side while in a flat bed without using bedrails?: A Lot Help needed moving from lying on your back to sitting on the side of a flat bed without using bedrails?: A Lot Help needed moving to and from a bed to a chair (including a wheelchair)?: Total Help needed standing up from a chair using your arms (e.g., wheelchair or bedside chair)?: Total Help needed to walk in hospital room?: Total Help needed climbing 3-5 steps with a railing? : Total 6 Click Score: 8    End of Session Equipment Utilized During Treatment: Other (comment) (bil cam walker boots.) Activity Tolerance: Patient tolerated treatment well Patient left: in chair;Other (comment);with chair alarm set (posey alarm belt) Nurse Communication: Mobility  status PT Visit Diagnosis: Pain;Other abnormalities of gait and mobility (R26.89) Pain - Right/Left: Right (bil) Pain - part of body: Leg;Hip     Time: 8952-8884 PT Time Calculation (min) (ACUTE ONLY): 28 min  Charges:    $Therapeutic Exercise: 8-22 mins $Therapeutic Activity: 8-22 mins PT General Charges $$ ACUTE PT VISIT: 1 Visit                    Norene Ames, PT, DPT Acute Rehabilitation Services Secure chat preferred Office #: [phone]    Norene CHRISTELLA Ames 04/26/2024, 2:05 PM

## 2024-04-27 LAB — BASIC METABOLIC PANEL WITH GFR
Anion gap: 11 (ref 5–15)
BUN: 14 mg/dL (ref 8–23)
CO2: 25 mmol/L (ref 22–32)
Calcium: 8.7 mg/dL — ABNORMAL LOW (ref 8.9–10.3)
Chloride: 102 mmol/L (ref 98–111)
Creatinine, Ser: 0.76 mg/dL (ref 0.44–1.00)
GFR, Estimated: >60 mL/min
Glucose, Bld: 115 mg/dL — ABNORMAL HIGH (ref 70–99)
Potassium: 3.9 mmol/L (ref 3.5–5.1)
Sodium: 138 mmol/L (ref 135–145)

## 2024-04-27 LAB — CBC
HCT: 23.3 % — ABNORMAL LOW (ref 36.0–46.0)
Hemoglobin: 7.7 g/dL — ABNORMAL LOW (ref 12.0–15.0)
MCH: 30.4 pg (ref 26.0–34.0)
MCHC: 33 g/dL (ref 30.0–36.0)
MCV: 92.1 fL (ref 80.0–100.0)
Platelets: 132 K/uL — ABNORMAL LOW (ref 150–400)
RBC: 2.53 MIL/uL — ABNORMAL LOW (ref 3.87–5.11)
RDW: 14.6 % (ref 11.5–15.5)
WBC: 4.8 K/uL (ref 4.0–10.5)
nRBC: 0 % (ref 0.0–0.2)

## 2024-04-27 MED ADMIN — Levetiracetam Tab 500 MG: 500 mg | ORAL | NDC 51079082101

## 2024-04-27 MED ADMIN — Levetiracetam Tab 500 MG: 500 mg | ORAL | NDC 00904712461

## 2024-04-27 MED ADMIN — Quetiapine Fumarate Tab 50 MG: 50 mg | ORAL | NDC 67877024901

## 2024-04-27 MED ADMIN — Quetiapine Fumarate Tab 50 MG: 50 mg | ORAL | NDC 50268063111

## 2024-04-27 MED ADMIN — Lorazepam Inj 2 MG/ML: 1 mg | INTRAVENOUS | NDC 65219036801

## 2024-04-27 MED ADMIN — Lorazepam Inj 2 MG/ML: 1 mg | INTRAVENOUS | NDC 00641604401

## 2024-04-27 NOTE — Care Management Important Message (Signed)
 Important Message  Patient Details  Name: Shirley Woods MRN: [id number] Date of Birth: 08/22/1940   Important Message Given:  Yes - Medicare IM     [name] 04/27/2024, 12:51 PM

## 2024-04-27 NOTE — TOC Progression Note (Addendum)
 Transition of Care [facility]) - Progression Note    Patient Details  Name: Shirley Woods MRN: [id number] Date of Birth: Oct 01, 1940  Transition of Care [facility]) CM/SW Contact  Carmelita FORBES Carbon, LCSW Phone Number: 04/27/2024, 9:28 AM  Clinical Narrative:    DONALD is still pending.  Memory Care STR search:  Pending:  -[facility] - Leontine is reviewing -[facility] - Leontine is reviewing  -[place] - Tammy can consider, 2 hours away, she will need to assess in person -[facility] - Augustin will reconsider  Declined:  -[facility] states they cannot reconsider -[facility] - full per [place] [place] - full per [facility] - full per [employer] - full per [facility] -[facility] - unable to offer, and full  Called daughter Maeola with update - she confirms spouse is POA and they are updating him about placement process. She confirms he would be able to make the final decision of where patient goes. Sent list of SNFs with updates to Sherri by email per her request.    Expected Discharge Plan: Skilled Nursing Facility Barriers to Discharge: Continued Medical Work up               Expected Discharge Plan and Services   Discharge Planning Services: CM Consult   Living arrangements for the past 2 months: Assisted Living Facility (Memory Care)                                       Social Drivers of Health (SDOH) Interventions SDOH Screenings   Food Insecurity: No Food Insecurity (04/23/2024)  Housing: Low Risk (04/23/2024)  Transportation Needs: No Transportation Needs (04/23/2024)  Utilities: Not At Risk (04/23/2024)  Social Connections: Patient Unable To Answer (04/23/2024)    Readmission Risk Interventions     No data to display

## 2024-04-27 NOTE — Progress Notes (Signed)
 Patient arrived to unit alert and oriented to self and situation. Patient disoriented to time and place. RN obtained vital signs and admitted patient to telemetry. Patient in bed with equal and adequate chest rise. Bed alarm set, call bell with in patient's reach, and floor mats down. RN educated patient on safety and fall precautions. Patient verbalizes understanding of RN teaching.

## 2024-04-27 NOTE — Progress Notes (Signed)
°   04/27/24 1745  Hand-off documentation  Hand-off Given Given to Transfer Unit/facility (6N)  Report given to (Full Name) Mejii, RN  Hand-off Received Received from Transfer Unit/facility  Report received from (Full Name) Rufus Devonshire, RN   Pt transferred to [phone] with RN via bed with all personal belongings with no complications. Pt's spouse, Lamar, called and updated. 6N RN at bedside. Paper chart handed to [facility].

## 2024-04-27 NOTE — Progress Notes (Signed)
 Patient ID: Shirley Woods, female   DOB: 1941-04-09, 83 y.o.   MRN: [id number] 4 Days Post-Op    Subjective: Fell asleep eating, denies pain ROS negative except as listed above. Objective: Vital signs in last 24 hours: Temp:  [97.8 F (36.6 C)-98.7 F (37.1 C)] 98.4 F (36.9 C) (12/16 0806) Pulse Rate:  [96-132] 96 (12/16 0806) Resp:  [16-24] 18 (12/16 0806) BP: (103-162)/(60-129) 146/69 (12/16 0806) SpO2:  [86 %-100 %] 92 % (12/16 0806) Last BM Date :  (PTA)  Intake/Output from previous day: 12/15 0701 - 12/16 0700 In: -  Out: 250 [Urine:200; Drains:50] Intake/Output this shift: No intake/output data recorded.  General appearance: cooperative Resp: clear to auscultation bilaterally GI: soft, NT Extremities: BLE ortho dressings and splints, moves toes to command  Lab Results: CBC  Recent Labs    04/26/24 0516 04/27/24 0152  WBC 5.2 4.8  HGB 8.0* 7.7*  HCT 23.9* 23.3*  PLT 101* 132*   BMET Recent Labs    04/26/24 0516 04/27/24 0152  NA 140 138  K 3.9 3.9  CL 105 102  CO2 26 25  GLUCOSE 104* 115*  BUN 13 14  CREATININE 0.72 0.76  CALCIUM 8.4* 8.7*   PT/INR No results for input(s): LABPROT, INR in the last 72 hours. ABG No results for input(s): PHART, HCO3 in the last 72 hours.  Invalid input(s): PCO2, PO2  Studies/Results: No results found.  Anti-infectives: Anti-infectives (From admission, onward)    Start     Dose/Rate Route Frequency Ordered Stop   04/23/24 2000  ceFAZolin  (ANCEF ) IVPB 2g/100 mL premix        2 g 200 mL/hr over 30 Minutes Intravenous Every 8 hours 04/23/24 1509 04/24/24 1259   04/23/24 1259  vancomycin  (VANCOCIN ) powder  Status:  Discontinued          As needed 04/23/24 1259 04/23/24 1338   04/23/24 0915  ceFAZolin  (ANCEF ) IVPB 2g/100 mL premix        2 g 200 mL/hr over 30 Minutes Intravenous To [facility] 04/23/24 0858 04/23/24 1304   04/23/24 0907  ceFAZolin  (ANCEF ) 2-4 GM/100ML-% IVPB       Note  to Pharmacy: Jonda Yancy HERO: cabinet override      04/23/24 0907 04/23/24 1234   04/22/24 1845  ceFAZolin  (ANCEF ) IVPB 2g/100 mL premix        2 g 200 mL/hr over 30 Minutes Intravenous  Once 04/22/24 1836 04/22/24 1908       Assessment/Plan: Jump from 3rd floor window at memory care  Open R ankle fx, B calcaneus fx, L 2nd metatarsal fx - ortho c/s, Dr. Fidel, handoff to Dr. Kendal, ancef  given in TB, OR 12/12 Dr Kendal - ORIF Rt calcaneus, Closed treatment of left calcaneus fracture, right lateral malleolus fracture & Closed reduction and percutaneous fixation of left talonavicular joint subluxation, Percutaneous fixation of left navicular fracture - NWB b/l LE-need oral DOAC on dc L SDH - NSGY c/s, Dr. Lanis, keppra  x7d for sz ppx, repeat head CT stable ?L CFV DVT - duplex done and no DVT therefore ?RLL subsegmental PE is artifact Urinary retention - flomax ; pt removed foley 12/15; will bladder scan as needed  FEN - reg diet Acute blood loss anemia- hgb 11 to 7 to 8.3 to 8 to 7.7 today, PLTs up to 132 DVT - SCDs, started subcu heparin  12/14, DOAC on D/C per Ortho Trauma Thrombocytopenia - resolving as above Dispo -  PT/OT, TOC working on SNF,  CBC in AM as starting DOAC on D/C  LOS: 5 days    Dann Hummer, MD, MPH, FACS Trauma & General Surgery Use [email or web address] to contact on call provider  04/27/2024

## 2024-04-28 LAB — CBC
HCT: 26.4 % — ABNORMAL LOW (ref 36.0–46.0)
Hemoglobin: 8.7 g/dL — ABNORMAL LOW (ref 12.0–15.0)
MCH: 30.1 pg (ref 26.0–34.0)
MCHC: 33 g/dL (ref 30.0–36.0)
MCV: 91.3 fL (ref 80.0–100.0)
Platelets: 180 K/uL (ref 150–400)
RBC: 2.89 MIL/uL — ABNORMAL LOW (ref 3.87–5.11)
RDW: 14.5 % (ref 11.5–15.5)
WBC: 4.9 K/uL (ref 4.0–10.5)
nRBC: 0 % (ref 0.0–0.2)

## 2024-04-28 MED ORDER — ASPIRIN 325 MG PO TABS
325.0000 mg | ORAL_TABLET | Freq: Two times a day (BID) | ORAL | Status: DC
  Administered 2024-04-28 – 2024-05-18 (×39): 325 mg via ORAL
  Filled 2024-04-28 (×41): qty 1

## 2024-04-28 MED ADMIN — Levetiracetam Tab 500 MG: 500 mg | ORAL | NDC 00904712461

## 2024-04-28 MED ADMIN — Quetiapine Fumarate Tab 50 MG: 50 mg | ORAL | NDC 50268063111

## 2024-04-28 MED ADMIN — Lorazepam Inj 2 MG/ML: 1 mg | INTRAVENOUS | NDC 65219036801

## 2024-04-28 MED ADMIN — Tramadol HCl Tab 50 MG: 25 mg | ORAL | NDC 60687079511

## 2024-04-28 NOTE — Plan of Care (Signed)
  Problem: Pain Managment: Goal: General experience of comfort will improve and/or be controlled Outcome: Progressing   Problem: Safety: Goal: Ability to remain free from injury will improve Outcome: Progressing   Problem: Skin Integrity: Goal: Risk for impaired skin integrity will decrease Outcome: Progressing

## 2024-04-28 NOTE — Progress Notes (Signed)
 Occupational Therapy Treatment Patient Details Name: Shirley Woods MRN: [id number] DOB: 05-Aug-1940 Today's Date: 04/28/2024   History of present illness Shirley Woods is an 83 y.o. female who fell out of a 3rd story window trying to escape from memory care unit at [facility]. Upon arrival found to have Bil LE fxs and now is s/p ORIF of right calcaneus and right subtalar joint dislocation; closed reduction of left calcaneus fx, right lateral malleolus fx; closed reduction and percutaneous fixation of left talonavicular joint subluxation; and percutaneous fixation of left navicular fx. 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 PHMx: dementia, prior ABI >20 years ago.   OT comments  Pt progressing well towards OT goals. Focus of session on progressing functional transfers while adhering to NWB precautions and increasing independence in ADL tasks. Pt required up to Max A +2 for transfer to recliner and total A for toileting tasks this session. Pt continues to benefit from acute skilled OT services, continue per POC.       If plan is discharge home, recommend the following:  A lot of help with walking and/or transfers;A lot of help with bathing/dressing/bathroom;Help with stairs or ramp for entrance;Assist for transportation;Direct supervision/assist for financial management;Direct supervision/assist for medications management   Equipment Recommendations  None recommended by OT    Recommendations for Other Services      Precautions / Restrictions Precautions Precautions: Fall Recall of Precautions/Restrictions: Impaired Precaution/Restrictions Comments: CAM boots when OOB Restrictions Weight Bearing Restrictions Per Provider Order: Yes RLE Weight Bearing Per Provider Order: Non weight bearing LLE Weight Bearing Per Provider Order: Non weight bearing Other Position/Activity Restrictions: Per Camie Moores, PA-C on 12/13 via secure chat. That is  fine to allow her to have her feet on the ground in the CAM boots to assist with lateral scoot but do not want her trying to stand and transfer. Thanks       Mobility Bed Mobility Overal bed mobility: Needs Assistance Bed Mobility: Rolling, Sidelying to Sit Rolling: Mod assist, Used rails Sidelying to sit: +2 for physical assistance, Used rails, Mod assist       General bed mobility comments: BLE assist to prevent accidental WB when rolling to L/R sides. Mod A to maintain sidelying position.    Transfers Overall transfer level: Needs assistance Equipment used: None Transfers: Bed to chair/wheelchair/BSC            Lateral/Scoot Transfers: +2 safety/equipment, Max assist, From elevated surface General transfer comment: constant VCs for technique and to keep scooting.  Pt wanting to put feet down on the ground, ultimately maxAX2 via bed pad to scoot pt fully over to chair. Pt with very flexed trunk posture while scooting needing dense cues for posture/body mechanics to avoid anterior LOB.     Balance Overall balance assessment: Needs assistance Sitting-balance support: No upper extremity supported, Feet supported Sitting balance-Leahy Scale: Fair Sitting balance - Comments: impulsive with posture/anterior lean so supervision to CGA for safety       Standing balance comment: unable to stand due to NWB BLE orders                           ADL either performed or assessed with clinical judgement   ADL Overall ADL's : Needs assistance/impaired Eating/Feeding: Set up;Sitting Eating/Feeding Details (indicate cue type and reason): in recliner  Toilet Transfer: Requires drop arm Toilet Transfer Details (indicate cue type and reason): Needs drop arm BSC in room, not available this session. Pt rolled onto bed pan for urination. Toileting- Clothing Manipulation and Hygiene: Total assistance;Bed level Toileting - Clothing Manipulation Details  (indicate cue type and reason): Total A bed level for anterior peri care            Extremity/Trunk Assessment Upper Extremity Assessment Upper Extremity Assessment: Generalized weakness            Vision       Perception     Praxis     Communication Communication Communication: Impaired Factors Affecting Communication: Hearing impaired   Cognition Arousal: Alert Behavior During Therapy: Impulsive Cognition: History of cognitive impairments             OT - Cognition Comments: Pt pleasantly confused                 Following commands: Impaired Following commands impaired: Follows one step commands with increased time, Only follows one step commands consistently      Cueing   Cueing Techniques: Verbal cues, Gestural cues, Tactile cues, Visual cues  Exercises      Shoulder Instructions       General Comments VSS on RA. Hoyer lift pad under Pt    Pertinent Vitals/ Pain       Pain Assessment Pain Assessment: Faces Faces Pain Scale: Hurts a little bit Pain Location: lower back Pain Descriptors / Indicators: Discomfort Pain Intervention(s): Monitored during session, Limited activity within patient's tolerance, Repositioned  Home Living                                          Prior Functioning/Environment              Frequency  Min 2X/week        Progress Toward Goals  OT Goals(current goals can now be found in the care plan section)  Progress towards OT goals: Progressing toward goals  Acute Rehab OT Goals Patient Stated Goal: to eat OT Goal Formulation: Patient unable to participate in goal setting Time For Goal Achievement: 05/08/24 Potential to Achieve Goals: Fair ADL Goals Pt Will Perform Upper Body Dressing: with set-up;sitting (EOB) Pt Will Perform Lower Body Dressing: with min assist;sitting/lateral leans Pt Will Transfer to Toilet: with contact guard assist;with transfer board (drop arm BSC) Pt Will  Perform Toileting - Clothing Manipulation and hygiene: with contact guard assist;sitting/lateral leans Additional ADL Goal #1: Pt will be S in and OOB for basic ADLs  Plan      Co-evaluation    PT/OT/SLP Co-Evaluation/Treatment: Yes Reason for Co-Treatment: For patient/therapist safety;To address functional/ADL transfers PT goals addressed during session: Mobility/safety with mobility;Balance OT goals addressed during session: ADL's and self-care;Proper use of Adaptive equipment and DME      AM-PAC OT 6 Clicks Daily Activity     Outcome Measure   Help from another person eating meals?: A Little Help from another person taking care of personal grooming?: A Little Help from another person toileting, which includes using toliet, bedpan, or urinal?: Total Help from another person bathing (including washing, rinsing, drying)?: A Lot Help from another person to put on and taking off regular upper body clothing?: A Little Help from another person to put on and taking off regular lower body clothing?: Total 6 Click Score: 13  End of Session Equipment Utilized During Treatment: Other (comment) (CAM boot)  OT Visit Diagnosis: Other abnormalities of gait and mobility (R26.89);Other symptoms and signs involving cognitive function;Pain Pain - Right/Left: Right Pain - part of body: Leg   Activity Tolerance Patient tolerated treatment well   Patient Left in chair;with call bell/phone within reach;with chair alarm set;with family/visitor present (chair alarm belt, pt shown how to take it off)   Nurse Communication  (need for drop arm BSC)        Time: 8765-8687 OT Time Calculation (min): 38 min  Charges: OT General Charges $OT Visit: 1 Visit OT Treatments $Self Care/Home Management : 8-22 mins  Shirley Woods, OTR/L.  [facility] Acute Rehabilitation  Office: [phone]   Shirley Woods 04/28/2024, 1:54 PM

## 2024-04-28 NOTE — Progress Notes (Addendum)
 Progress Note  5 Days Post-Op  Subjective: Pt reports some back pain. Alert and pleasant. Oriented to self only. No family at bedside.   Objective: Vital signs in last 24 hours: Temp:  [98 F (36.7 C)-99.2 F (37.3 C)] 98.6 F (37 C) (12/17 0511) Pulse Rate:  [100-112] 109 (12/17 0100) Resp:  [16-19] 18 (12/17 0100) BP: (127-162)/(61-92) 153/92 (12/17 0511) SpO2:  [89 %-93 %] 92 % (12/17 0511) Last BM Date :  (PTA)  Intake/Output from previous day: 12/16 0701 - 12/17 0700 In: -  Out: 600 [Urine:600] Intake/Output this shift: No intake/output data recorded.  PE: General: pleasant, WD, elderly female who is laying in bed in NAD HEENT: Pupils equal and round, EOMI Heart: regular, rate, and rhythm.   Lungs: No wheezes, rhonchi, or rales noted.  Respiratory effort nonlabored Abd: soft, NT, ND MS: boots to BLE, BL toes WWP and NVI Neuro: speech clear, follows commands Psych: oriented to self only    Lab Results:  Recent Labs    04/27/24 0152 04/28/24 0304  WBC 4.8 4.9  HGB 7.7* 8.7*  HCT 23.3* 26.4*  PLT 132* 180   BMET Recent Labs    04/26/24 0516 04/27/24 0152  NA 140 138  K 3.9 3.9  CL 105 102  CO2 26 25  GLUCOSE 104* 115*  BUN 13 14  CREATININE 0.72 0.76  CALCIUM 8.4* 8.7*   PT/INR No results for input(s): LABPROT, INR in the last 72 hours. CMP     Component Value Date/Time   NA 138 04/27/2024 0152   K 3.9 04/27/2024 0152   CL 102 04/27/2024 0152   CO2 25 04/27/2024 0152   GLUCOSE 115 (H) 04/27/2024 0152   BUN 14 04/27/2024 0152   CREATININE 0.76 04/27/2024 0152   CALCIUM 8.7 (L) 04/27/2024 0152   PROT 6.8 04/22/2024 1840   ALBUMIN 3.8 04/22/2024 1840   AST 48 (H) 04/22/2024 1840   ALT 34 04/22/2024 1840   ALKPHOS 73 04/22/2024 1840   BILITOT 0.8 04/22/2024 1840   GFRNONAA >60 04/27/2024 0152   Lipase  No results found for: LIPASE     Studies/Results: No results found.  Anti-infectives: Anti-infectives (From admission,  onward)    Start     Dose/Rate Route Frequency Ordered Stop   04/23/24 2000  ceFAZolin  (ANCEF ) IVPB 2g/100 mL premix        2 g 200 mL/hr over 30 Minutes Intravenous Every 8 hours 04/23/24 1509 04/24/24 1259   04/23/24 1259  vancomycin  (VANCOCIN ) powder  Status:  Discontinued          As needed 04/23/24 1259 04/23/24 1338   04/23/24 0915  ceFAZolin  (ANCEF ) IVPB 2g/100 mL premix        2 g 200 mL/hr over 30 Minutes Intravenous To ShortStay Surgical 04/23/24 0858 04/23/24 1304   04/23/24 0907  ceFAZolin  (ANCEF ) 2-4 GM/100ML-% IVPB       Note to Pharmacy: Jonda Yancy HERO: cabinet override      04/23/24 0907 04/23/24 1234   04/22/24 1845  ceFAZolin  (ANCEF ) IVPB 2g/100 mL premix        2 g 200 mL/hr over 30 Minutes Intravenous  Once 04/22/24 1836 04/22/24 1908        Assessment/Plan  Jump from 3rd floor window at memory care   Open R ankle fx, B calcaneus fx, L 2nd metatarsal fx - ortho c/s, Dr. Fidel, handoff to Dr. Kendal, ancef  given in TB, OR 12/12 Dr Kendal - ORIF  Rt calcaneus, Closed treatment of left calcaneus fracture, right lateral malleolus fracture & Closed reduction and percutaneous fixation of left talonavicular joint subluxation, Percutaneous fixation of left navicular fracture - NWB b/l LE- discussed with NS and ortho - ok for 325 mg ASA BID on DC for VTE prophylaxis L SDH - NSGY c/s, Dr. Lanis, keppra  x7d for sz ppx, repeat head CT stable ?L CFV DVT - duplex done and no DVT therefore ?RLL subsegmental PE is artifact Urinary retention - flomax ; pt removed foley 12/15; will bladder scan as needed  Acute blood loss anemia- hgb 8.7 this AM, stable Thrombocytopenia - plts 180 this AM, resolved  FEN - reg diet DVT - SCDs, started subcu heparin  12/14, discussed with NS and ortho - ok for 325 mg ASA BID on DC for VTE prophylaxis ID - no current abx Dispo - 6N, continue PT/OT, TOC working on SNF. Will clarify with NS when she can start DOAC since hgb and plts stable.  NS  prefers ASA on DC or wait until 12/19 for DOAC - discussed with ortho and they recommend 325 mg ASA BID on DC    LOS: 6 days   I reviewed Consultant NS, ortho notes, last 24 h vitals and pain scores, last 48 h intake and output, last 24 h labs and trends, and last 24 h imaging results.  This care required moderate level of medical decision making.    Burnard JONELLE Louder, [facility] 04/28/2024, 8:54 AM Please see Amion for pager number during day hours 7:00am-4:30pm

## 2024-04-28 NOTE — TOC Progression Note (Addendum)
 Transition of Care [facility]) - Progression Note    Patient Details  Name: DEETTE REVAK MRN: [id number] Date of Birth: 04-Jan-1941  Transition [facility] Care [facility]) CM/SW Contact  Carmelita FORBES Carbon, LCSW Phone Number: 04/28/2024, 10:01 AM  Clinical Narrative:    Declined at the following Memory Care SNFs: -[facility] states they cannot reconsider -[facility] - full per [facility] - full per [facility] - full per Tammy -[facility] - full per Casey -[facility] - unable to offer, and full per [facility] - full per [facility] - full per [facility] - unable to offer per Augustin   Asked patient's daughter if they would like [facility] to consider.    10:25-  Received call from [facility] stating if family chooses for patient to return there to their SNF, they would have 24 hour sitters in her private SNF room. [facility] states patient could not return to ILF with HH. Called daughter Maeola to follow up. Sherri states they are not interested in [facility] as it is too far away. Tylene states she is going to speak with the family about if they want to consider her returning to [place].    Expected Discharge Plan: Skilled Nursing Facility Barriers to Discharge: Continued Medical Work up               Expected Discharge Plan and Services   Discharge Planning Services: CM Consult   Living arrangements for the past 2 months: Assisted Living Facility (Memory Care)                                       Social Drivers of Health (SDOH) Interventions SDOH Screenings   Food Insecurity: No Food Insecurity (04/23/2024)  Housing: Low Risk (04/23/2024)  Transportation Needs: No Transportation Needs (04/23/2024)  Utilities: Not At Risk (04/23/2024)  Social Connections: Patient Unable To Answer (04/23/2024)    Readmission Risk Interventions     No data to display

## 2024-04-28 NOTE — Progress Notes (Signed)
 Pt refused her 1800 meds. Shirley Woods is confused and not remembering her husband been present early today. Husband- Robert at bedside requested to keep bed alarm off while he is here. External catheter place. Night shift nurse aware of the situation. Pt is now stating she is in pain but still refusing to take anything.

## 2024-04-28 NOTE — Progress Notes (Signed)
 Physical Therapy Treatment Patient Details Name: Shirley Woods MRN: [id number] DOB: Jul 06, 1940 Today's Date: 04/28/2024   History of Present Illness Tailey Top Restivo is an 83 y.o. female who fell out of a 3rd story window trying to escape from memory care unit at [facility]. Upon arrival found to have Bil LE fxs and now is s/p ORIF of right calcaneus and right subtalar joint dislocation; closed reduction of left calcaneus fx, right lateral malleolus fx; closed reduction and percutaneous fixation of left talonavicular joint subluxation; and percutaneous fixation of left navicular fx. 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 PHMx: dementia, prior ABI >20 years ago.    PT Comments  Pt received in supine, spouse present and encouraging, pt agreeable to therapy session with goal of transfer OOB to chair to eat lunch which had just arrived. Pt needing up to +2 maxA to perform lateral scoot, and up to modA +2 to perform bed mobility with use of rail and multimodal cues for improved body mechanics throughout. Pt with difficulty maintaining BLE NWB with transfers, but good effort to use BUE when scooting. Pt up in recliner with chair alarm on for safety and hoyer pad under her hips for return transfer later in the day, PA notified pt would benefit from purewick given that no drop arm BSC available and pt NWB BLE and needing to urinate frequently per nursing staff. Patient will benefit from continued inpatient follow up therapy, <3 hours/day, she is making good progress toward goals with good participation in all tasks this date.    If plan is discharge home, recommend the following: A lot of help with bathing/dressing/bathroom;Assistance with cooking/housework;Direct supervision/assist for medications management;Direct supervision/assist for financial management;Assist for transportation;Help with stairs or ramp for entrance;Supervision due to cognitive status;Two  people to help with walking and/or transfers   Can travel by private vehicle     No  Equipment Recommendations  Other (comment);None recommended by PT (TBA at a later venue, currently needs hospital bed and hoyer lift)    Recommendations for Other Services       Precautions / Restrictions Precautions Precautions: Fall Recall of Precautions/Restrictions: Impaired Precaution/Restrictions Comments: CAM boots when OOB Restrictions Weight Bearing Restrictions Per Provider Order: Yes RLE Weight Bearing Per Provider Order: Non weight bearing LLE Weight Bearing Per Provider Order: Non weight bearing Other Position/Activity Restrictions: Per Camie Moores, PA-C on 12/13 via secure chat. That is fine to allow her to have her feet on the ground in the CAM boots to assist with lateral scoot but do not want her trying to stand and transfer. Thanks     Mobility  Bed Mobility Overal bed mobility: Needs Assistance Bed Mobility: Rolling, Sidelying to Sit Rolling: Mod assist, Used rails Sidelying to sit: +2 for physical assistance, Used rails, Mod assist       General bed mobility comments: BLE assist to prevent accidental WB when rolling to L/R sides    Transfers Overall transfer level: Needs assistance Equipment used: None Transfers: Bed to chair/wheelchair/BSC            Lateral/Scoot Transfers: +2 safety/equipment, Max assist, From elevated surface General transfer comment: constant VCs for technique and to keep scooting.  Pt wanting to put feet down on the ground, ultimately maxAX2 via bed pad to scoot pt fully over to chair. Pt with very flexed trunk posture while scooting needing dense cues for posture/body mechanics to avoid anterior LOB.    Ambulation/Gait  Stairs             Wheelchair Mobility     Tilt Bed    Modified Rankin (Stroke Patients Only)       Balance Overall balance assessment: Needs assistance Sitting-balance  support: No upper extremity supported, Feet supported Sitting balance-Leahy Scale: Fair Sitting balance - Comments: impulsive with posture/anterior lean so supervision to CGA for safety       Standing balance comment: unable to stand due to NWB BLE orders                            Communication Communication Communication: Impaired Factors Affecting Communication: Hearing impaired  Cognition Arousal: Alert Behavior During Therapy: Impulsive   PT - Cognitive impairments: History of cognitive impairments, Sequencing, Problem solving, Safety/Judgement, Attention                       PT - Cognition Comments: pt pleasant and eager to mobilize. pt was able to follow simple commands with multimodal cues, assist to maintain bilat LE NWB as pt forgets when rolling and scooting. Following commands: Impaired Following commands impaired: Follows one step commands with increased time, Only follows one step commands consistently    Cueing Cueing Techniques: Verbal cues, Gestural cues, Tactile cues, Visual cues  Exercises Other Exercises Other Exercises: pt performs hip/knee flex/ext a few reps for warm-up in supine and while preparing to roll to L/R sides, AA to prevent accidental WB Other Exercises: Did not perform AP due to bil CAM boots donned throughout    General Comments General comments (skin integrity, edema, etc.): VSS on RA per chart review, no acute s/sx distress with postural changes. Pt assisted on/off bed pan to urinate prior to scooting OOB to chair and needing totalA for peri-care and damp linens removed from bed after pt up in chair. hoyer pad under her in chair for safety, staff aware      Pertinent Vitals/Pain Pain Assessment Pain Assessment: PAINAD Faces Pain Scale: No hurt Breathing: normal Negative Vocalization: none Facial Expression: smiling or inexpressive Body Language: relaxed Consolability: no need to console PAINAD Score: 0 Pain  Intervention(s): Monitored during session, Repositioned    Home Living                          Prior Function            PT Goals (current goals can now be found in the care plan section) Acute Rehab PT Goals Patient Stated Goal: None stated, spouse and pt agreeable to work on OOB transfers PT Goal Formulation: Patient unable to participate in goal setting Time For Goal Achievement: 05/08/24 Progress towards PT goals: Progressing toward goals    Frequency    Min 3X/week      PT Plan      Co-evaluation PT/OT/SLP Co-Evaluation/Treatment: Yes Reason for Co-Treatment: For patient/therapist safety;To address functional/ADL transfers PT goals addressed during session: Mobility/safety with mobility;Balance        AM-PAC PT 6 Clicks Mobility   Outcome Measure  Help needed turning from your back to your side while in a flat bed without using bedrails?: A Lot Help needed moving from lying on your back to sitting on the side of a flat bed without using bedrails?: A Lot Help needed moving to and from a bed to a chair (including a wheelchair)?: Total Help needed standing up from a  chair using your arms (e.g., wheelchair or bedside chair)?: Total Help needed to walk in hospital room?: Total Help needed climbing 3-5 steps with a railing? : Total 6 Click Score: 8    End of Session Equipment Utilized During Treatment: Other (comment) (bil cam walker boots.) Activity Tolerance: Patient tolerated treatment well Patient left: in chair;Other (comment);with chair alarm set;with family/visitor present (posey lap alarm belt with pt/spouse shown how to remove velcro; hoyer pad under her in chair, spouse in room) Nurse Communication: Mobility status;Need for lift equipment;Other (comment);Weight bearing status (pt needs purewick canister, PA to order for her since no drop arm BSC available; hoyer for return transfer) PT Visit Diagnosis: Pain;Other abnormalities of gait and  mobility (R26.89) Pain - Right/Left: Right (bil) Pain - part of body: Leg;Hip     Time: 8766-8687 PT Time Calculation (min) (ACUTE ONLY): 39 min  Charges:    $Therapeutic Activity: 23-37 mins PT General Charges $$ ACUTE PT VISIT: 1 Visit                     Damek Ende P., PTA Acute Rehabilitation Services Secure Chat Preferred 9a-5:30pm Office: [phone]    Connell HERO [facility] 04/28/2024, 1:30 PM

## 2024-04-28 NOTE — TOC Progression Note (Signed)
 Transition of Care [facility]) - Progression Note    Patient Details  Name: Shirley Woods MRN: [id number] Date of Birth: [birth year]  Transition [facility] Care [facility]) CM/SW Contact  Carmelita FORBES Carbon, LCSW Phone Number: 04/28/2024, 8:55 AM  Clinical Narrative:    PASRR obtained [id number] H  Memory Care STR search:  Pending:  -[facility] - asked Leontine for update -[facility] - asked Leontine for update -[facility] - family prefers somewhere closer -[facility] - asked Augustin for update  Received message from patient's daughter inquiring about Medicare covering in home services. Explained Medicare covers HH but this is a limited amount of hours and family would likely need to hire private pay aide for patient's needs to be met.    Expected Discharge Plan: Skilled Nursing Facility Barriers to Discharge: Continued Medical Work up               Expected Discharge Plan and Services   Discharge Planning Services: CM Consult   Living arrangements for the past 2 months: Assisted Living Facility (Memory Care)                                       Social Drivers of Health (SDOH) Interventions SDOH Screenings   Food Insecurity: No Food Insecurity (04/23/2024)  Housing: Low Risk (04/23/2024)  Transportation Needs: No Transportation Needs (04/23/2024)  Utilities: Not At Risk (04/23/2024)  Social Connections: Patient Unable To Answer (04/23/2024)    Readmission Risk Interventions     No data to display

## 2024-04-29 LAB — CBC
HCT: 27.2 % — ABNORMAL LOW (ref 36.0–46.0)
Hemoglobin: 8.8 g/dL — ABNORMAL LOW (ref 12.0–15.0)
MCH: 29.8 pg (ref 26.0–34.0)
MCHC: 32.4 g/dL (ref 30.0–36.0)
MCV: 92.2 fL (ref 80.0–100.0)
Platelets: 182 K/uL (ref 150–400)
RBC: 2.95 MIL/uL — ABNORMAL LOW (ref 3.87–5.11)
RDW: 14.7 % (ref 11.5–15.5)
WBC: 4.4 K/uL (ref 4.0–10.5)
nRBC: 0 % (ref 0.0–0.2)

## 2024-04-29 MED ORDER — LIDOCAINE 5 % EX PTCH
1.0000 | MEDICATED_PATCH | CUTANEOUS | Status: DC
  Administered 2024-04-29 – 2024-05-18 (×16): 1 via TRANSDERMAL
  Filled 2024-04-29 (×21): qty 1

## 2024-04-29 MED ADMIN — Levetiracetam Tab 500 MG: 500 mg | ORAL | NDC 00904712461

## 2024-04-29 MED ADMIN — Quetiapine Fumarate Tab 50 MG: 50 mg | ORAL | NDC 67877024901

## 2024-04-29 MED ADMIN — Quetiapine Fumarate Tab 50 MG: 50 mg | ORAL | NDC 50268063111

## 2024-04-29 MED ADMIN — Haloperidol Lactate Inj 5 MG/ML: 2 mg | INTRAVENOUS | NDC 67457042600

## 2024-04-29 MED FILL — Haloperidol Lactate Inj 5 MG/ML: 2.0000 mg | INTRAMUSCULAR | Qty: 1 | Status: AC

## 2024-04-29 MED FILL — Haloperidol Lactate Inj 5 MG/ML: 2.0000 mg | INTRAMUSCULAR | Qty: 1 | Status: CN

## 2024-04-29 MED FILL — Lorazepam Tab 1 MG: 1.0000 mg | ORAL | Qty: 1 | Status: AC

## 2024-04-29 NOTE — Progress Notes (Signed)
 Progress Note  6 Days Post-Op  Subjective: Pt reports some back pain. Alert and pleasant but confused. Per RN had significant sundowning yesterday evening.   Objective: Vital signs in last 24 hours: Temp:  [97.6 F (36.4 C)-98.5 F (36.9 C)] 98.5 F (36.9 C) (12/18 0519) Pulse Rate:  [100-107] 107 (12/18 0519) Resp:  [20] 20 (12/17 1001) BP: (141-168)/(66-94) 152/80 (12/18 0853) SpO2:  [94 %-97 %] 97 % (12/18 0519) Last BM Date :  (PTA)  Intake/Output from previous day: 12/17 0701 - 12/18 0700 In: -  Out: 800 [Urine:800] Intake/Output this shift: No intake/output data recorded.  PE: General: pleasant, WD, elderly female who is laying in bed in NAD HEENT: Pupils equal and round, EOMI Heart: regular, rate, and rhythm.   Lungs: No wheezes, rhonchi, or rales noted.  Respiratory effort nonlabored Abd: soft, NT, ND MS: boots to BLE, BL toes WWP and NVI. VAC to RLE Neuro: speech clear, follows commands Psych: oriented to self only    Lab Results:  Recent Labs    04/28/24 0304 04/29/24 0152  WBC 4.9 4.4  HGB 8.7* 8.8*  HCT 26.4* 27.2*  PLT 180 182   BMET Recent Labs    04/27/24 0152  NA 138  K 3.9  CL 102  CO2 25  GLUCOSE 115*  BUN 14  CREATININE 0.76  CALCIUM 8.7*   PT/INR No results for input(s): LABPROT, INR in the last 72 hours. CMP     Component Value Date/Time   NA 138 04/27/2024 0152   K 3.9 04/27/2024 0152   CL 102 04/27/2024 0152   CO2 25 04/27/2024 0152   GLUCOSE 115 (H) 04/27/2024 0152   BUN 14 04/27/2024 0152   CREATININE 0.76 04/27/2024 0152   CALCIUM 8.7 (L) 04/27/2024 0152   PROT 6.8 04/22/2024 1840   ALBUMIN 3.8 04/22/2024 1840   AST 48 (H) 04/22/2024 1840   ALT 34 04/22/2024 1840   ALKPHOS 73 04/22/2024 1840   BILITOT 0.8 04/22/2024 1840   GFRNONAA >60 04/27/2024 0152   Lipase  No results found for: LIPASE     Studies/Results: No results found.  Anti-infectives: Anti-infectives (From admission, onward)     Start     Dose/Rate Route Frequency Ordered Stop   04/23/24 2000  ceFAZolin  (ANCEF ) IVPB 2g/100 mL premix        2 g 200 mL/hr over 30 Minutes Intravenous Every 8 hours 04/23/24 1509 04/24/24 1259   04/23/24 1259  vancomycin  (VANCOCIN ) powder  Status:  Discontinued          As needed 04/23/24 1259 04/23/24 1338   04/23/24 0915  ceFAZolin  (ANCEF ) IVPB 2g/100 mL premix        2 g 200 mL/hr over 30 Minutes Intravenous To ShortStay Surgical 04/23/24 0858 04/23/24 1304   04/23/24 0907  ceFAZolin  (ANCEF ) 2-4 GM/100ML-% IVPB       Note to Pharmacy: Jonda Yancy HERO: cabinet override      04/23/24 0907 04/23/24 1234   04/22/24 1845  ceFAZolin  (ANCEF ) IVPB 2g/100 mL premix        2 g 200 mL/hr over 30 Minutes Intravenous  Once 04/22/24 1836 04/22/24 1908        Assessment/Plan  Jump from 3rd floor window at memory care   Open R ankle fx, B calcaneus fx, L 2nd metatarsal fx - ortho c/s, Dr. Fidel, handoff to Dr. Kendal, ancef  given in TB, OR 12/12 Dr Kendal - ORIF Rt calcaneus, Closed treatment of left  calcaneus fracture, right lateral malleolus fracture & Closed reduction and percutaneous fixation of left talonavicular joint subluxation, Percutaneous fixation of left navicular fracture - NWB b/l LE- discussed with NS and ortho - ok for 325 mg ASA BID on DC for VTE prophylaxis L SDH - NSGY c/s, Dr. Lanis, keppra  x7d for sz ppx, repeat head CT stable ?L CFV DVT - duplex done and no DVT therefore ?RLL subsegmental PE is artifact Urinary retention - flomax ; pt removed foley 12/15; will bladder scan as needed  Acute blood loss anemia- hgb 8.8 this AM, stable Thrombocytopenia - plts 182 this AM, resolved  FEN - reg diet DVT - SCDs, started subcu heparin  12/14, discussed with NS and ortho - ok for 325 mg ASA BID on DC for VTE prophylaxis this was started 12/17 ID - no current abx Dispo - 6N, continue PT/OT, TOC working on SNF. Medically stable for DC to SNF     LOS: 7 days   I reviewed  last 24 h vitals and pain scores, last 48 h intake and output, last 24 h labs and trends, and last 24 h imaging results.  This care required moderate level of medical decision making.    Burnard JONELLE Louder, [facility] 04/29/2024, 9:29 AM Please see Amion for pager number during day hours 7:00am-4:30pm

## 2024-04-29 NOTE — TOC Progression Note (Addendum)
 Transition of Care [facility]) - Progression Note    Patient Details  Name: Shirley Woods MRN: [id number] Date of Birth: [birth year]  Transition of Care [facility]) CM/SW Contact  Marcellina Jonsson E Khalil Szczepanik, LCSW Phone Number: 04/29/2024, 10:07 AM  Clinical Narrative:    Received a call from [facility] Read of Nursing at [facility] - stating they would like to come assess patient today at 1:30, requested they follow up with the family directly about this.  1:05- CSW called patient's daughter Maeola to follow up. Sherri states the family is having discussions with [facility] about the care they can provide to patient there and they are hopeful they can feel comfortable sending patient to [facility] SNF at discharge. CSW will follow up with [facility] after they visit patient this afternoon.  3:50- Called daughter Maeola, she states the family would like to pursue patient returning to [facility], they are waiting for [facility] to meet with their Leadership to confirm they can take patient back.  -Called [facility] DON Vernell to follow up, left a VM requesting a return call.  -Called [facility] Admissions Brittany who states she is also waiting to hear from [employer]. She states the earliest they would admit would likely be Monday. Provided Trauma RNCM number for follow up tomorrow, shara will need to be started if [facility] confirms they can take patient back.  Expected Discharge Plan: Skilled Nursing Facility Barriers to Discharge: Continued Medical Work up               Expected Discharge Plan and Services   Discharge Planning Services: CM Consult   Living arrangements for the past 2 months: Assisted Living Facility (Memory Care)                                       Social Drivers of Health (SDOH) Interventions SDOH Screenings   Food Insecurity: No Food Insecurity (04/23/2024)  Housing: Low Risk (04/23/2024)  Transportation Needs: No Transportation Needs  (04/23/2024)  Utilities: Not At Risk (04/23/2024)  Social Connections: Patient Unable To Answer (04/23/2024)    Readmission Risk Interventions     No data to display

## 2024-04-29 NOTE — Progress Notes (Signed)
 Pt pulled bilateral ortho boots off and pulled at the incision wound vac. See MAR. Paged ortho MD of the situation waiting on called back. Dr. Vernita placed new orders for dry dressing and to remove wound vac and they will be in the am to reevaluate.
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
## 2024-04-29 NOTE — Plan of Care (Signed)
°  Problem: Education: Goal: Knowledge of General Education information will improve Description: Including pain rating scale, medication(s)/side effects and non-pharmacologic comfort measures Outcome: Not Progressing   Problem: Clinical Measurements: Goal: Diagnostic test results will improve Outcome: Progressing Goal: Respiratory complications will improve Outcome: Progressing   Problem: Activity: Goal: Risk for activity intolerance will decrease Outcome: Progressing   Problem: Coping: Goal: Level of anxiety will decrease Outcome: Not Progressing Note: Pt is confused

## 2024-04-30 ENCOUNTER — Inpatient Hospital Stay (HOSPITAL_COMMUNITY)

## 2024-04-30 LAB — BASIC METABOLIC PANEL WITH GFR
Anion gap: 10 (ref 5–15)
BUN: 15 mg/dL (ref 8–23)
CO2: 22 mmol/L (ref 22–32)
Calcium: 9.3 mg/dL (ref 8.9–10.3)
Chloride: 105 mmol/L (ref 98–111)
Creatinine, Ser: 0.74 mg/dL (ref 0.44–1.00)
GFR, Estimated: >60 mL/min
Glucose, Bld: 100 mg/dL — ABNORMAL HIGH (ref 70–99)
Potassium: 4.1 mmol/L (ref 3.5–5.1)
Sodium: 137 mmol/L (ref 135–145)

## 2024-04-30 MED ORDER — QUETIAPINE FUMARATE 50 MG PO TABS
100.0000 mg | ORAL_TABLET | Freq: Every day | ORAL | Status: DC
  Administered 2024-04-30 – 2024-05-17 (×18): 100 mg via ORAL
  Filled 2024-04-30 (×18): qty 2

## 2024-04-30 MED ORDER — OXYCODONE HCL 5 MG PO TABS
2.5000 mg | ORAL_TABLET | Freq: Four times a day (QID) | ORAL | Status: DC | PRN
  Administered 2024-04-30 – 2024-05-15 (×13): 5 mg via ORAL
  Filled 2024-04-30 (×14): qty 1

## 2024-04-30 MED ADMIN — Polyethylene Glycol 3350 Oral Packet 17 GM: 17 g | ORAL | NDC 00904693186

## 2024-04-30 MED ADMIN — Haloperidol Lactate Inj 5 MG/ML: 2 mg | INTRAVENOUS | NDC 67457042600

## 2024-04-30 MED ADMIN — Quetiapine Fumarate Tab 50 MG: 50 mg | ORAL | NDC 67877024901

## 2024-04-30 MED ADMIN — Lorazepam Tab 1 MG: 1 mg | ORAL | NDC 13107008405

## 2024-04-30 MED FILL — Polyethylene Glycol 3350 Oral Packet 17 GM: 17.0000 g | ORAL | Qty: 1 | Status: AC

## 2024-04-30 MED FILL — Quetiapine Fumarate Tab 50 MG: 50.0000 mg | ORAL | Qty: 1 | Status: AC
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
## 2024-04-30 NOTE — Plan of Care (Signed)

## 2024-04-30 NOTE — Progress Notes (Addendum)
 "   7 Days Post-Op  Subjective: CC: Notes reviewed from overnight. Patient removed incisional wound vac and took off ortho boots. Pateint made multiple unassisted attempted to get OOB   Called to patients room this AM. RN, NT, event organiser and security in the room.  Patient agitated, standing, swinging her purwick.  Given haldol  and more redirectable.  Good uop over the last 12 hours.   Objective: Vital signs in last 24 hours: Temp:  [97.7 F (36.5 C)-98.6 F (37 C)] 98.1 F (36.7 C) (12/18 2324) Pulse Rate:  [94-100] 100 (12/18 2324) Resp:  [16-18] 18 (12/18 2324) BP: (150-153)/(65-80) 150/65 (12/18 2324) SpO2:  [97 %] 97 % (12/18 2324) Last BM Date :  (PTA)  Intake/Output from previous day: 12/18 0701 - 12/19 0700 In: 120 [P.O.:120] Out: 700 [Urine:700] Intake/Output this shift: No intake/output data recorded.  PE: Gen:  Alert, agitated Card:  Reg Pulm:  CTAB, no W/R/R, effort normal Abd: Soft, ND Ext: ACE wraps to BLE partially removed by patient  Lab Results:  Recent Labs    04/28/24 0304 04/29/24 0152  WBC 4.9 4.4  HGB 8.7* 8.8*  HCT 26.4* 27.2*  PLT 180 182   BMET No results for input(s): NA, K, CL, CO2, GLUCOSE, BUN, CREATININE, CALCIUM in the last 72 hours. PT/INR No results for input(s): LABPROT, INR in the last 72 hours. CMP     Component Value Date/Time   NA 138 04/27/2024 0152   K 3.9 04/27/2024 0152   CL 102 04/27/2024 0152   CO2 25 04/27/2024 0152   GLUCOSE 115 (H) 04/27/2024 0152   BUN 14 04/27/2024 0152   CREATININE 0.76 04/27/2024 0152   CALCIUM 8.7 (L) 04/27/2024 0152   PROT 6.8 04/22/2024 1840   ALBUMIN 3.8 04/22/2024 1840   AST 48 (H) 04/22/2024 1840   ALT 34 04/22/2024 1840   ALKPHOS 73 04/22/2024 1840   BILITOT 0.8 04/22/2024 1840   GFRNONAA >60 04/27/2024 0152   Lipase  No results found for: LIPASE  Studies/Results: No results found.  Anti-infectives: Anti-infectives (From admission,  onward)    Start     Dose/Rate Route Frequency Ordered Stop   04/23/24 2000  ceFAZolin  (ANCEF ) IVPB 2g/100 mL premix        2 g 200 mL/hr over 30 Minutes Intravenous Every 8 hours 04/23/24 1509 04/24/24 1259   04/23/24 1259  vancomycin  (VANCOCIN ) powder  Status:  Discontinued          As needed 04/23/24 1259 04/23/24 1338   04/23/24 0915  ceFAZolin  (ANCEF ) IVPB 2g/100 mL premix        2 g 200 mL/hr over 30 Minutes Intravenous To [facility] 04/23/24 0858 04/23/24 1304   04/23/24 0907  ceFAZolin  (ANCEF ) 2-4 GM/100ML-% IVPB       Note to Pharmacy: Jonda Yancy HERO: cabinet override      04/23/24 0907 04/23/24 1234   04/22/24 1845  ceFAZolin  (ANCEF ) IVPB 2g/100 mL premix        2 g 200 mL/hr over 30 Minutes Intravenous  Once 04/22/24 1836 04/22/24 1908        Assessment/Plan Jump from 3rd floor window at memory care   Open R ankle fx, B calcaneus fx, L 2nd metatarsal fx - ortho c/s, Dr. Fidel, handoff to Dr. Kendal, ancef  given in TB, OR 12/12 Dr Kendal - ORIF Rt calcaneus, Closed treatment of left calcaneus fracture, right lateral malleolus fracture & Closed reduction and percutaneous fixation of left talonavicular  joint subluxation, Percutaneous fixation of left navicular fracture - NWB b/l LE- discussed with NS and ortho - ok for 325 mg ASA BID on DC for VTE prophylaxis. Reached out to Ortho 12/19 given patient removed cam boots, vac, dressings, and was standing on BLE L SDH - NSGY c/s, Dr. Lanis, keppra  x7d for sz ppx, repeat head CT stable ?L CFV DVT - duplex done and no DVT therefore ?RLL subsegmental PE is artifact Urinary retention - flomax ; pt removed foley 12/15; will bladder scan as needed  Acute blood loss anemia- hgb 8.8 on last check and  stable Thrombocytopenia - resolved on last check Agitation - Home Zoloft  and Lamictal . 50mg  Seroquel  daily, increase to 100mg  at bedtime. PRN Haldol . Delirium precautions. Soft restraints and recruitment consultant.    FEN - reg diet,  increase bowel regimen DVT - SCDs, started subcu heparin  12/14, discussed with NS and ortho - ok for 325 mg ASA BID on DC for VTE prophylaxis this was started 12/17 ID - no current abx Foley - none currently, spont void Dispo - 6N, continue PT/OT, TOC working on SNF.   I reviewed nursing notes, last 24 h vitals and pain scores, last 48 h intake and output, last 24 h labs and trends, and last 24 h imaging results.    LOS: 8 days    Shirley Woods Shaper, [facility] 04/30/2024, 8:49 AM Please see Amion for pager number during day hours 7:00am-4:30pm  "

## 2024-04-30 NOTE — Progress Notes (Signed)
 Patient requested Cam Boots to be removed for sleeping. Per note from today written by Lauraine Moores, PA Orthopedic device(s): Cam boot to BLE when OOB   Boot was removed as requested since patient is resting in bed.

## 2024-04-30 NOTE — Progress Notes (Signed)
 Orthopaedic Trauma Progress Note  SUBJECTIVE: Patient with continued attempts to get OOB without assistance overnight as well as earlier this morning.  Noted that patient has been weightbearing through both legs and her attempts to get out of bed.  She removed her incisional wound VAC from RLE last night.  Was transitioned to a dry dressing at that time.  Due to increased agitation this morning, patient was placed in wrist restraints and given Haldol .  She is doing okay now.  Pleasantly confused.  Denies any significant pain in BLE at rest.  No family at bedside currently.  OBJECTIVE:  Vitals:   04/29/24 1741 04/29/24 2324  BP: (!) 153/80 (!) 150/65  Pulse: 96 100  Resp: 16 18  Temp: 97.7 F (36.5 C) 98.1 F (36.7 C)  SpO2: 97% 97%    Opiates Today (MME): Today's  total administered Morphine  Milligram Equivalents: 7.5 Opiates Yesterday (MME): Yesterday's total administered Morphine  Milligram Equivalents: 6  General: Sitting up in bed comfortably, no acute distress Respiratory: No increased work of breathing.  RLE: Dressing clean, dry, intact.  Adjusted Ace wrap to patient's comfort.  Some bruising and swelling to the forefoot and toes.  Able to wiggle the toes slightly.  Endorses sensation light touch over the foot.  Toes warm and well-perfused LLE: Dressing changed, pin sites to the dorsal foot appear stable with no signs of infection. Notable bruising to the calf and anterior tibia.  No significant tenderness over this area.  No significant increase in pain with passive stretch of the toes.  Toes warm and well-perfused wiggle toes slightly  IMAGING: Repeat imaging right calcaneus and left ankle ordered this morning given patient's been weightbearing BLE  LABS:  No results found for this or any previous visit (from the past 24 hours).   ASSESSMENT: Shirley Woods is a 83 y.o. female, 7 Days Post-Op s/p jump from third floor window at memory care unit  Procedures:  ORIF RIGHT  CALCANEUS FRACTURE OPEN REDUCTION OF RIGHT SUBTALAR JOINT DISLOCATION IRRIGATION AND DEBRIDEMENT OF RIGHT OPEN CALCANEUS FRACTURE CLOSED TREATMENT OF LEFT CALCANEUS FRACTURE CLOSED TREATMENT OF RIGHT LATERAL MALLEOLUS FRACTURE CLOSED REDUCTION AND PERCUTANEOUS FIXATION OF LEFT TALONAVICULAR JOINT SUBLUXATION PERCUTANEOUS FIXATION OF LEFT NAVICULAR FRACTURE  CV/Blood loss: Acute blood loss anemia, Hgb 8.8 on 04/29/2024. Hemodynamically stable.   PLAN: Weightbearing: NWB RLE and LLE.  Okay to have BLE resting on the ground in cam boots to assist with transfers.  Do not want her standing on either leg ROM: Okay for ankle ROM as tolerated Incisional and dressing care: Daily dressing changes starting today  Showering: Okay for bed bath Orthopedic device(s): Cam boot to BLE when OOB Pain management: Continue current regimen.  Limit narcotics as able VTE prophylaxis: Heparin  + aspirin  ID:  Ancef  2gm post op has been completed Foley/Lines:  No foley, KVO IVFs Impediments to Fracture Healing: Vitamin D  level low normal at 31.  Continue supplementation Dispo: PT/OT evaluation ongoing.  Ortho issues stable. Okay for discharge from Ortho standpoint once cleared by trauma team and therapies  D/C recommendations: - Aspirin  325 mg twice daily x 30 days for DVT prophylaxis following discussion with primary team and neurosurgery recommendation - Continue 2000 units Vit D supplementation daily x 90 days  Follow - up plan: 2 weeks after d/c for wound check and repeat x-rays   Contact information:  Franky Light MD, Lauraine Moores PA-C. After hours and holidays please check [email or web address] for group call information for [facility]  Lauraine PATRIC Moores, PA-C [phone] (office) [email or web address]

## 2024-04-30 NOTE — Progress Notes (Signed)
 Patient has made multiple unassisted attempted to get OOB this morning. Staff comes to assist patient when bed alarm rings, and staff attempts to reorient patient and remind her of NWB status to BLE, but patient continues to state I'm gonna walk into that bathroom. Purewick used, but patient forgets and removes, then attempts to get OOB due to linen being wet. Bed change x2 since 0700. Tele-sitter order in place, this nurse told by staffing no camera available. Bed alarm remains in place and call bell in reach. Previous nurse spoke with husband and he stated he is coming to visit with patient this morning.
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
## 2024-04-30 NOTE — Progress Notes (Signed)
 No safety sitter for 3-7 pm available from staffing, [facility] aware. Husband at bedside but he will be leaving soon.

## 2024-05-01 ENCOUNTER — Encounter (HOSPITAL_COMMUNITY): Payer: Self-pay

## 2024-05-01 MED ADMIN — Polyethylene Glycol 3350 Oral Packet 17 GM: 17 g | ORAL | NDC 00904693186

## 2024-05-01 MED ADMIN — Haloperidol Lactate Inj 5 MG/ML: 2 mg | INTRAVENOUS | NDC 67457042600

## 2024-05-01 MED ADMIN — Quetiapine Fumarate Tab 50 MG: 50 mg | ORAL | NDC 67877024901

## 2024-05-01 NOTE — Progress Notes (Signed)
 "   8 Days Post-Op  Subjective: Sleeping this morning. Seems she was less agitated overnight but did receive haldol  x2 doses.  Objective: Vital signs in last 24 hours: Temp:  [97.7 F (36.5 C)-98 F (36.7 C)] 97.8 F (36.6 C) (12/20 0437) Pulse Rate:  [100-106] 102 (12/20 0437) Resp:  [18-20] 20 (12/20 0437) BP: (137-174)/(64-84) 165/64 (12/20 0437) SpO2:  [95 %-100 %] 97 % (12/20 0437) Last BM Date : 04/30/24  Intake/Output from previous day: 12/19 0701 - 12/20 0700 In: 240 [P.O.:240] Out: 1350 [Urine:1350] Intake/Output this shift: No intake/output data recorded.  PE: Gen:  sleeping comfortably Pulm:  nonlabored respirations on room air Ext: warm and well-perfused  Lab Results:  Recent Labs    04/29/24 0152  WBC 4.4  HGB 8.8*  HCT 27.2*  PLT 182   BMET Recent Labs    04/30/24 1305  NA 137  K 4.1  CL 105  CO2 22  GLUCOSE 100*  BUN 15  CREATININE 0.74  CALCIUM 9.3   PT/INR No results for input(s): LABPROT, INR in the last 72 hours. CMP     Component Value Date/Time   NA 137 04/30/2024 1305   K 4.1 04/30/2024 1305   CL 105 04/30/2024 1305   CO2 22 04/30/2024 1305   GLUCOSE 100 (H) 04/30/2024 1305   BUN 15 04/30/2024 1305   CREATININE 0.74 04/30/2024 1305   CALCIUM 9.3 04/30/2024 1305   PROT 6.8 04/22/2024 1840   ALBUMIN 3.8 04/22/2024 1840   AST 48 (H) 04/22/2024 1840   ALT 34 04/22/2024 1840   ALKPHOS 73 04/22/2024 1840   BILITOT 0.8 04/22/2024 1840   GFRNONAA >60 04/30/2024 1305   Lipase  No results found for: LIPASE  Studies/Results: DG Os Calcis Right Result Date: 04/30/2024 CLINICAL DATA:  Fracture follow-up. EXAM: RIGHT OS CALCIS - 2+ VIEW COMPARISON:  04/23/2024.  CT 04/22/2024 FINDINGS: Two screws traverse the subtalar joint. Calcaneal fracture is in unchanged alignment with slightly decreasing conspicuity of the fracture lines. No new fracture. Soft tissue edema is decreasing. IMPRESSION: Healing calcaneal fracture status  post ORIF. No change in alignment. Electronically Signed   By: Andrea Gasman M.D.   On: 04/30/2024 11:34   DG Ankle Complete Left Result Date: 04/30/2024 CLINICAL DATA:  852015 Fracture follow-up 852015 EXAM: LEFT ANKLE COMPLETE - 3+ VIEW COMPARISON:  04/23/24, CT 04/22/2024 FINDINGS: Two K-wires traverse the talonavicular joint. The navicular fracture is only faintly visualized on the lateral view, grossly unchanged in alignment. Comminuted calcaneal fracture slightly better visualized on the current exam with some sclerosis at the fracture line. No change in alignment. Fractures involving the cuboid and base of the third metatarsal on CT are not well demonstrated by radiograph diminishing soft tissue edema. No new fracture. IMPRESSION: 1. Two K-wires traverse the talonavicular joint. Grossly stable navicular fracture alignment. 2. Comminuted calcaneal fracture with some sclerosis at the fracture line. No change in alignment. Electronically Signed   By: Andrea Gasman M.D.   On: 04/30/2024 11:33    Anti-infectives: Anti-infectives (From admission, onward)    Start     Dose/Rate Route Frequency Ordered Stop   04/23/24 2000  ceFAZolin  (ANCEF ) IVPB 2g/100 mL premix        2 g 200 mL/hr over 30 Minutes Intravenous Every 8 hours 04/23/24 1509 04/24/24 1259   04/23/24 1259  vancomycin  (VANCOCIN ) powder  Status:  Discontinued          As needed 04/23/24 1259 04/23/24 1338  04/23/24 0915  ceFAZolin  (ANCEF ) IVPB 2g/100 mL premix        2 g 200 mL/hr over 30 Minutes Intravenous To ShortStay Surgical 04/23/24 0858 04/23/24 1304   04/23/24 0907  ceFAZolin  (ANCEF ) 2-4 GM/100ML-% IVPB       Note to Pharmacy: Jonda Yancy HERO: cabinet override      04/23/24 0907 04/23/24 1234   04/22/24 1845  ceFAZolin  (ANCEF ) IVPB 2g/100 mL premix        2 g 200 mL/hr over 30 Minutes Intravenous  Once 04/22/24 1836 04/22/24 1908        Assessment/Plan Jump from 3rd floor window at memory care   Open R ankle  fx, B calcaneus fx, L 2nd metatarsal fx - ortho c/s, Dr. Fidel, handoff to Dr. Kendal, ancef  given in TB, OR 12/12 Dr Kendal - ORIF Rt calcaneus, Closed treatment of left calcaneus fracture, right lateral malleolus fracture & Closed reduction and percutaneous fixation of left talonavicular joint subluxation, Percutaneous fixation of left navicular fracture - NWB b/l LE- discussed with NS and ortho - ok for 325 mg ASA BID on DC for VTE prophylaxis.  L SDH - NSGY c/s, Dr. Lanis, keppra  x7d for sz ppx (completed), repeat head CT stable ?L CFV DVT - duplex done and no DVT therefore ?RLL subsegmental PE is artifact Urinary retention - flomax ; pt removed foley 12/15; voiding spontaneously Acute blood loss anemia- hgb 8.8 on last check 12/18 and stable. No signs of bleeding. Agitation - Home Zoloft  and Lamictal . 50mg  Seroquel  in morning and 100mg  at bedtime. PRN Haldol . Delirium precautions. Soft restraints and recruitment consultant.    FEN - reg diet, bowel regimen DVT - SCDs, started subcu heparin  12/14, discussed with NS and ortho - ok for 325 mg ASA BID on DC for VTE prophylaxis this was started 12/17 ID - no current abx Foley - none currently, spont void Dispo - 6N, continue PT/OT, TOC working on SNF.   I reviewed nursing notes, last 24 h vitals and pain scores, last 48 h intake and output, last 24 h labs and trends, and last 24 h imaging results.    LOS: 9 days    Leonor LITTIE Dawn, MD [facility] Surgery 05/01/2024, 9:58 AM Please see Amion for pager number during day hours 7:00am-4:30pm  "

## 2024-05-02 MED ORDER — QUETIAPINE FUMARATE 50 MG PO TABS
50.0000 mg | ORAL_TABLET | Freq: Two times a day (BID) | ORAL | Status: DC
  Administered 2024-05-02 – 2024-05-18 (×31): 50 mg via ORAL
  Filled 2024-05-02 (×32): qty 1

## 2024-05-02 MED ADMIN — Polyethylene Glycol 3350 Oral Packet 17 GM: 17 g | ORAL | NDC 00904693186

## 2024-05-02 MED ADMIN — Haloperidol Lactate Inj 5 MG/ML: 2 mg | INTRAVENOUS | NDC 67457042600

## 2024-05-02 MED ADMIN — Quetiapine Fumarate Tab 50 MG: 50 mg | ORAL | NDC 67877024901

## 2024-05-02 MED FILL — Haloperidol Lactate Inj 5 MG/ML: 5.0000 mg | INTRAMUSCULAR | Qty: 1 | Status: AC

## 2024-05-02 MED FILL — Quetiapine Fumarate Tab 50 MG: 50.0000 mg | ORAL | Qty: 1 | Status: AC

## 2024-05-02 NOTE — Plan of Care (Signed)
   Problem: Education: Goal: Knowledge of General Education information will improve Description Including pain rating scale, medication(s)/side effects and non-pharmacologic comfort measures Outcome: Progressing

## 2024-05-02 NOTE — Progress Notes (Signed)
" °   05/02/24 2355  Provider Notification  Provider Name/Title Kinsinger, MD  Date Provider Notified 05/02/24  Time Provider Notified 2345  Method of Notification Page  Notification Reason Other (Comment) (continuous facial twitching)  Provider response No new orders  Date of Provider Response 05/02/24  Time of Provider Response 2355   Dr, Shirley Woods was contacted and made aware of pharmacy's recommendation to start pt on lorazepam  or cogentin. 0.5mg  lorazepam  iv was given as ordered.  "

## 2024-05-02 NOTE — Progress Notes (Signed)
 Patient attempting to get out of bed. Patient is not able to be redirected by this clinical research associate or sitter at bedside. Patient on side of bed stating she needs to get out of bed. This writer attempted to explain and reorient patient that she had surgery and cannot put weight on either leg. Pt continued to try and stand. While trying to assist patient to lay back down patient became agitated and aggressive hitting, kicking, scratching staff members and attempting to bite. Attempts were made to try and calm patient including use of ordered prn medications but these were unsuccessful. Patient trying to pull IV out as well as pull dressings off of her legs. Patient placed back in bilateral soft wrist restraints for her safety and MD was notified. Order placed for soft wrist restraints. Patient sitter remains at bedside.

## 2024-05-02 NOTE — Progress Notes (Signed)
 "   9 Days Post-Op  Subjective: Patient is very agitated and combative this morning. Per nursing also did not sleep overnight and was agitated. Has received haldol  and remains combative.  Objective: Vital signs in last 24 hours: Temp:  [97.8 F (36.6 C)-98.6 F (37 C)] 98.5 F (36.9 C) (12/21 1011) Pulse Rate:  [104-110] 110 (12/21 1011) Resp:  [16-17] 16 (12/21 1011) BP: (152-180)/(89-93) 158/92 (12/21 1011) SpO2:  [96 %-97 %] 97 % (12/21 1011) Last BM Date : 05/01/24  Intake/Output from previous day: 12/20 0701 - 12/21 0700 In: -  Out: 1850 [Urine:1850] Intake/Output this shift: Total I/O In: -  Out: 251 [Urine:250; Stool:1]  PE: Gen: alert, agitated Pulm:  nonlabored respirations on room air Ext: warm and well-perfused  Lab Results:  No results for input(s): WBC, HGB, HCT, PLT in the last 72 hours.  BMET Recent Labs    04/30/24 1305  NA 137  K 4.1  CL 105  CO2 22  GLUCOSE 100*  BUN 15  CREATININE 0.74  CALCIUM 9.3   PT/INR No results for input(s): LABPROT, INR in the last 72 hours. CMP     Component Value Date/Time   NA 137 04/30/2024 1305   K 4.1 04/30/2024 1305   CL 105 04/30/2024 1305   CO2 22 04/30/2024 1305   GLUCOSE 100 (H) 04/30/2024 1305   BUN 15 04/30/2024 1305   CREATININE 0.74 04/30/2024 1305   CALCIUM 9.3 04/30/2024 1305   PROT 6.8 04/22/2024 1840   ALBUMIN 3.8 04/22/2024 1840   AST 48 (H) 04/22/2024 1840   ALT 34 04/22/2024 1840   ALKPHOS 73 04/22/2024 1840   BILITOT 0.8 04/22/2024 1840   GFRNONAA >60 04/30/2024 1305   Lipase  No results found for: LIPASE  Studies/Results: No results found.   Anti-infectives: Anti-infectives (From admission, onward)    Start     Dose/Rate Route Frequency Ordered Stop   04/23/24 2000  ceFAZolin  (ANCEF ) IVPB 2g/100 mL premix        2 g 200 mL/hr over 30 Minutes Intravenous Every 8 hours 04/23/24 1509 04/24/24 1259   04/23/24 1259  vancomycin  (VANCOCIN ) powder  Status:   Discontinued          As needed 04/23/24 1259 04/23/24 1338   04/23/24 0915  ceFAZolin  (ANCEF ) IVPB 2g/100 mL premix        2 g 200 mL/hr over 30 Minutes Intravenous To ShortStay Surgical 04/23/24 0858 04/23/24 1304   04/23/24 0907  ceFAZolin  (ANCEF ) 2-4 GM/100ML-% IVPB       Note to Pharmacy: Jonda Yancy HERO: cabinet override      04/23/24 0907 04/23/24 1234   04/22/24 1845  ceFAZolin  (ANCEF ) IVPB 2g/100 mL premix        2 g 200 mL/hr over 30 Minutes Intravenous  Once 04/22/24 1836 04/22/24 1908        Assessment/Plan Jump from 3rd floor window at memory care   Open R ankle fx, B calcaneus fx, L 2nd metatarsal fx - ortho c/s, Dr. Fidel, handoff to Dr. Kendal, ancef  given in TB, OR 12/12 Dr Kendal - ORIF Rt calcaneus, Closed treatment of left calcaneus fracture, right lateral malleolus fracture & Closed reduction and percutaneous fixation of left talonavicular joint subluxation, Percutaneous fixation of left navicular fracture - NWB b/l LE- discussed with NS and ortho - ok for 325 mg ASA BID on DC for VTE prophylaxis.  L SDH - NSGY c/s, Dr. Lanis, keppra  x7d for sz ppx (completed),  repeat head CT stable ?L CFV DVT - duplex done and no DVT therefore ?RLL subsegmental PE is artifact Urinary retention - flomax ; pt removed foley 12/15; voiding spontaneously Acute blood loss anemia- hgb 8.8 on last check 12/18 and stable. No signs of bleeding. Agitation - Home Zoloft  and Lamictal . Will add a third dose of Seroquel  in the afternoon - 50mg  in am, 50mg  in afternoon, and 100mg  at bedtime. Haldol  dose increased to 5mg . Will get EKG to check QT c.   FEN - reg diet, bowel regimen DVT - SCDs, started subcu heparin  12/14, discussed with NS and ortho - ok for 325 mg ASA BID on DC for VTE prophylaxis this was started 12/17 ID - no current abx Foley - none currently, spont void Dispo - 6N, continue PT/OT, TOC working on SNF.   I reviewed nursing notes, last 24 h vitals and pain scores, last 48 h  intake and output, last 24 h labs and trends, and last 24 h imaging results.    LOS: 10 days    Leonor LITTIE Dawn, MD [facility] Surgery 05/02/2024, 12:30 PM Please see Amion for pager number during day hours 7:00am-4:30pm  "

## 2024-05-03 MED ORDER — LORAZEPAM 0.5 MG PO TABS
0.5000 mg | ORAL_TABLET | Freq: Four times a day (QID) | ORAL | Status: DC | PRN
  Administered 2024-05-05 – 2024-05-17 (×7): 0.5 mg via ORAL
  Filled 2024-05-03 (×8): qty 1

## 2024-05-03 MED ADMIN — Polyethylene Glycol 3350 Oral Packet 17 GM: 17 g | ORAL | NDC 00904693186

## 2024-05-03 MED ADMIN — Lorazepam Inj 2 MG/ML: 0.5 mg | INTRAVENOUS | NDC 65219036801

## 2024-05-03 MED FILL — Lorazepam Inj 2 MG/ML: 0.5000 mg | INTRAMUSCULAR | Qty: 1 | Status: AC

## 2024-05-03 NOTE — Progress Notes (Signed)
 Physical Therapy Treatment Patient Details Name: Shirley Woods MRN: [id number] DOB: 02-01-1941 Today's Date: 05/03/2024   History of Present Illness Shirley Woods is an 83 y.o. female who fell out of a 3rd story window trying to escape from memory care unit at [facility]. Upon arrival found to have Bil LE fxs and now is s/p ORIF of right calcaneus and right subtalar joint dislocation; closed reduction of left calcaneus fx, right lateral malleolus fx; closed reduction and percutaneous fixation of left talonavicular joint subluxation; and percutaneous fixation of left navicular fx. 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 PHMx: dementia, prior ABI >20 years ago.    PT Comments  Pt received in supine, alert and agreeable to therapy session, pt restless and eager to get OOB but following simple commands with some repetition due to decreased carryover of information. Pt totalA to don BLE CAM boots prior to OOB and minA for bed mobility with cues for body mechanics/safety and maxA for lateral scoot to drop arm chair on her L side. Pt following commands well for seated exercises, occasionally needing multimodal cues for better technique. Pt set up to eat lunch with tray in front of her in recliner, spouse and safety sitter present. NT x2 notified of safe technique for return transfer via hoyer vs lateral scoot and +2 later in the day, in 1-2 hours if pt able to tolerate sitting up that long.     If plan is discharge home, recommend the following: A lot of help with bathing/dressing/bathroom;Assistance with cooking/housework;Direct supervision/assist for medications management;Direct supervision/assist for financial management;Assist for transportation;Help with stairs or ramp for entrance;Supervision due to cognitive status;Two people to help with walking and/or transfers   Can travel by private vehicle     No (ambulance transport 2/2 pt  restlessness/impulsivity; may be too impulsive for [facility] fleeta)  Equipment Recommendations  Other (comment);None recommended by PT (TBD at SNF, pt currently needs hospital bed and wheelchair with removable arm and leg rests, elevating leg rests, may need slide board vs hoyer)    Recommendations for Other Services       Precautions / Restrictions Precautions Precautions: Fall Recall of Precautions/Restrictions: Impaired Precaution/Restrictions Comments: CAM boots when OOB, sundowning Restrictions Weight Bearing Restrictions Per Provider Order: Yes RLE Weight Bearing Per Provider Order: Non weight bearing LLE Weight Bearing Per Provider Order: Non weight bearing Other Position/Activity Restrictions: Per Shirley Moores, PA-C on 12/13 via secure chat. That is fine to allow her to have her feet on the ground in the CAM boots to assist with lateral scoot but do not want her trying to stand and transfer.     Mobility  Bed Mobility Overal bed mobility: Needs Assistance Bed Mobility: Rolling, Sidelying to Sit Rolling: Min assist Sidelying to sit: Min assist, Used rails       General bed mobility comments: Log roll for pt safety to L EOB to avoid pressure through her legs, pt initiates well to push up, minA for stability and lifting/scooting forward    Transfers Overall transfer level: Needs assistance Equipment used: None (bed pad) Transfers: Bed to chair/wheelchair/BSC            Lateral/Scoot Transfers: Max assist, From elevated surface General transfer comment: Bed to drop arm recliner with maxA +1, pillow under pt BLE/cam boots in place to prevent her from trying to push through her legs and for ease of scooting limbs as she used her arms to lift hips up/over along  EOB toward chair. Pt needing mod to maxA via bed pad to scoot to chair on her L side. Pt then able to participate in chair push-up exercise and encouraged by PTA to work on eating lunch as per spouse she only ate x2 cookies  from tray and no other food yet.    Ambulation/Gait                   Psychologist, Counselling mobility:  (defer due to lack of +2 assist for safety this date, plan to bring WC next session)   Tilt Bed    Modified Rankin (Stroke Patients Only)       Balance Overall balance assessment: Needs assistance Sitting-balance support: No upper extremity supported, Feet supported Sitting balance-Leahy Scale: Fair         Standing balance comment: unable to stand due to NWB BLE orders                            Communication Communication Communication: Impaired Factors Affecting Communication: Hearing impaired  Cognition Arousal: Alert Behavior During Therapy: Restless, WFL for tasks assessed/performed   PT - Cognitive impairments: History of cognitive impairments, Attention, Sequencing, Problem solving, Safety/Judgement                       PT - Cognition Comments: pt pleasant and eager to mobilize. pt was able to follow simple commands with multimodal cues, assist to maintain bilat LE NWB as pt forgets when rolling and scooting. Pt somewhat restless once up in chair so her tray table was placed in front of her and pt encouraged to self-feed. Pt also has a recruitment consultant and her spouse in room to redirect her. Following commands: Impaired      Cueing Cueing Techniques: Verbal cues, Gestural cues, Tactile cues, Visual cues  Exercises Other Exercises Other Exercises: chair push-ups x5 in recliner, pt able to clear hips fully on a few reps Other Exercises: seated BLE AROM: LAQ x5 reps ea    General Comments General comments (skin integrity, edema, etc.): No acute s/sx distress, pt without c/o pain; paper pants donned once pt in chair per her request for comfort, she has briefs and purewick on also already.      Pertinent Vitals/Pain Pain Assessment Pain Assessment: PAINAD Breathing:  normal Negative Vocalization: none Facial Expression: smiling or inexpressive Body Language: tense, distressed pacing, fidgeting Consolability: distracted or reassured by voice/touch PAINAD Score: 2 Pain Intervention(s): Limited activity within patient's tolerance, Monitored during session, Repositioned    Home Living                          Prior Function            PT Goals (current goals can now be found in the care plan section) Acute Rehab PT Goals Patient Stated Goal: None stated, spouse and pt agreeable to work on OOB transfers Progress towards PT goals: Progressing toward goals    Frequency    Min 3X/week      PT Plan      Co-evaluation              AM-PAC PT 6 Clicks Mobility   Outcome Measure  Help needed turning from your back to your side while in a flat bed without using  bedrails?: A Little Help needed moving from lying on your back to sitting on the side of a flat bed without using bedrails?: A Little Help needed moving to and from a bed to a chair (including a wheelchair)?: A Lot Help needed standing up from a chair using your arms (e.g., wheelchair or bedside chair)?: Total Help needed to walk in hospital room?: Total Help needed climbing 3-5 steps with a railing? : Total 6 Click Score: 11    End of Session Equipment Utilized During Treatment: Other (comment) (bil CAM boots donned) Activity Tolerance: Patient tolerated treatment well Patient left: in chair;with call bell/phone within reach;with chair alarm set;with family/visitor present;Other (comment);with nursing/sitter in room (representative from facility arriving to her room to visit with pt and spouse, sitter in her room) Nurse Communication: Mobility status;Need for lift equipment;Other (comment);Weight bearing status (hoyer pad under her to return to bed, esp if agitated; NT x2 notified how to have her scoot safely back via pad/drop arm chair if she is following  instructions) PT Visit Diagnosis: Pain;Other abnormalities of gait and mobility (R26.89)     Time: 1324-1350 PT Time Calculation (min) (ACUTE ONLY): 26 min  Charges:    $Therapeutic Exercise: 8-22 mins $Therapeutic Activity: 8-22 mins PT General Charges $$ ACUTE PT VISIT: 1 Visit                     Markeeta Scalf P., PTA Acute Rehabilitation Services Secure Chat Preferred 9a-5:30pm Office: [phone]    Connell HERO [facility] 05/03/2024, 2:03 PM

## 2024-05-03 NOTE — Progress Notes (Signed)
" °   05/02/24 2355  Provider Notification  Provider Name/Title Kinsinger, MD  Date Provider Notified 05/02/24  Time Provider Notified 2345  Method of Notification Page  Notification Reason Other (Comment) (continuous facial twitching)  Provider response No new orders  Date of Provider Response 05/02/24  Time of Provider Response 2355    "

## 2024-05-03 NOTE — TOC Progression Note (Addendum)
 Transition of Care [facility]) - Progression Note    Patient Details  Name: Shirley Woods MRN: [id number] Date of Birth: 07-31-1940  Transition of Care [facility]) CM/SW Contact  Carmelita FORBES Carbon, LCSW Phone Number: 05/03/2024, 9:33 AM  Clinical Narrative:    CSW spoke with patient's daughter Maeola. Sherri states they are hopeful patient can return to [facility], however they declined her at her current state due to her being on IV medications and having hallucinations per [place]. Discussed other options if [facility] declines. Sherri states they do not want to consider SNFs that are further away due to patient's husband being elderly and having to drive far to visit. Explained the other option would be private pay for aides at home. Sherri inquired if Medicare will cover aide services since the closer Memory Care SNFs declined patient - explained that private aide services are not covered by Medicare - encouraged Sherri to call Medicare to see if there are any other options for them. Sherri states she has someone she is interviewing for possible private care coming to see patient today. Left VM for Brittany at [facility] requesting a return call.  Received call from Tammy with [facility] who states they may be able to assess for a bed at [facility] in [place].  10:15- Call from Josh, Production Designer, Theatre/television/film at [facility] (Brittany in Admissions is out today). Sidra states she is still our resident and we still plan to take her back when ready. He states patients behaviors need to be more controlled and she needs to be on a by mouth regimen for her Dementia.   CSW called patient's daughter Maeola. She states she does not want to consider [facility], she states she would prefer to take patient home with private aide services. Sherri states she would like to know if Neurology can see patient to try to make medication adjustments for patient's Dementia - relayed request to Providers.   12:28- Trauma Provider  states patient is being changed to home ativan  dose, increased seroquel  dose, and patient is off of IV meds. Attempted call to [facility] to discuss - awaiting return call.  Expected Discharge Plan: Skilled Nursing Facility Barriers to Discharge: Continued Medical Work up               Expected Discharge Plan and Services   Discharge Planning Services: CM Consult   Living arrangements for the past 2 months: Assisted Living Facility (Memory Care)                                       Social Drivers of Health (SDOH) Interventions SDOH Screenings   Food Insecurity: No Food Insecurity (04/23/2024)  Housing: Low Risk (04/23/2024)  Transportation Needs: No Transportation Needs (04/23/2024)  Utilities: Not At Risk (04/23/2024)  Social Connections: Patient Unable To Answer (04/23/2024)    Readmission Risk Interventions     No data to display

## 2024-05-03 NOTE — Progress Notes (Signed)
 I was asked to assess Shirley Woods for some periodic twitching as concern for possible seizure. Shirley Woods is alert, oriented to self and MAE with equal strength. While I was talking with her, I witnessed several episodes of facial, upper extremity and lower extremity rigid movements that did not look seizure like. She is aware during episodes and can talk through them. The episodes are only a few seconds in length but are frequent about every 2 mins or so when I was at the bedside. I believe they are involuntary and she does not have the ability to control when or if they happen. They appear extrapyramidal or dystonic. Pt takes ativan  at home or pharmacy suggested Benztropine.

## 2024-05-03 NOTE — Progress Notes (Signed)
 "   10 Days Post-Op  Subjective: Patient is calm this morning. Disoriented but pleasant and conversant. Has not received any IV haldol  in last 24 hours but did receive a dose of IV ativan  overnight. No facial twitching noted this morning.  Objective: Vital signs in last 24 hours: Temp:  [98.4 F (36.9 C)-98.6 F (37 C)] 98.5 F (36.9 C) (12/22 0513) Pulse Rate:  [82-110] 107 (12/22 0953) Resp:  [19] 19 (12/21 1715) BP: (134-164)/(79-92) 158/83 (12/22 0953) SpO2:  [94 %-98 %] 94 % (12/22 0513) Last BM Date : 05/02/24  Intake/Output from previous day: 12/21 0701 - 12/22 0700 In: 545 [P.O.:545] Out: 251 [Urine:250; Stool:1] Intake/Output this shift: No intake/output data recorded.  PE: Gen: alert, disoriented but calm and conversant. Pulm:  nonlabored respirations on room air Ext: warm and well-perfused, dressings in place bilateral feet, ecchymosis around the ankles  Lab Results:  No results for input(s): WBC, HGB, HCT, PLT in the last 72 hours.  BMET Recent Labs    04/30/24 1305  NA 137  K 4.1  CL 105  CO2 22  GLUCOSE 100*  BUN 15  CREATININE 0.74  CALCIUM 9.3   PT/INR No results for input(s): LABPROT, INR in the last 72 hours. CMP     Component Value Date/Time   NA 137 04/30/2024 1305   K 4.1 04/30/2024 1305   CL 105 04/30/2024 1305   CO2 22 04/30/2024 1305   GLUCOSE 100 (H) 04/30/2024 1305   BUN 15 04/30/2024 1305   CREATININE 0.74 04/30/2024 1305   CALCIUM 9.3 04/30/2024 1305   PROT 6.8 04/22/2024 1840   ALBUMIN 3.8 04/22/2024 1840   AST 48 (H) 04/22/2024 1840   ALT 34 04/22/2024 1840   ALKPHOS 73 04/22/2024 1840   BILITOT 0.8 04/22/2024 1840   GFRNONAA >60 04/30/2024 1305   Lipase  No results found for: LIPASE  Studies/Results: No results found.   Anti-infectives: Anti-infectives (From admission, onward)    Start     Dose/Rate Route Frequency Ordered Stop   04/23/24 2000  ceFAZolin  (ANCEF ) IVPB 2g/100 mL premix        2  g 200 mL/hr over 30 Minutes Intravenous Every 8 hours 04/23/24 1509 04/24/24 1259   04/23/24 1259  vancomycin  (VANCOCIN ) powder  Status:  Discontinued          As needed 04/23/24 1259 04/23/24 1338   04/23/24 0915  ceFAZolin  (ANCEF ) IVPB 2g/100 mL premix        2 g 200 mL/hr over 30 Minutes Intravenous To [facility] 04/23/24 0858 04/23/24 1304   04/23/24 0907  ceFAZolin  (ANCEF ) 2-4 GM/100ML-% IVPB       Note to Pharmacy: Jonda Yancy HERO: cabinet override      04/23/24 0907 04/23/24 1234   04/22/24 1845  ceFAZolin  (ANCEF ) IVPB 2g/100 mL premix        2 g 200 mL/hr over 30 Minutes Intravenous  Once 04/22/24 1836 04/22/24 1908        Assessment/Plan Jump from 3rd floor window at memory care   Open R ankle fx, B calcaneus fx, L 2nd metatarsal fx - ortho c/s, Dr. Fidel, handoff to Dr. Kendal, ancef  given in TB, OR 12/12 Dr Kendal - ORIF Rt calcaneus, Closed treatment of left calcaneus fracture, right lateral malleolus fracture & Closed reduction and percutaneous fixation of left talonavicular joint subluxation, Percutaneous fixation of left navicular fracture - NWB b/l LE- discussed with NS and ortho - ok for 325 mg ASA  BID on DC for VTE prophylaxis.  L SDH - NSGY c/s, Dr. Lanis, keppra  x7d for sz ppx (completed), repeat head CT stable ?L CFV DVT - duplex done and no DVT therefore ?RLL subsegmental PE is artifact Urinary retention - flomax ; pt removed foley 12/15; voiding spontaneously Agitation - Home Zoloft  and Lamictal . Seroquel  increased 12/21 to 50mg  morning and afternoon and 100mg  at bedtime. Agitation seems much better. Will resume home dose of PO ativan  0.5mg  q6h prn and discontinue IV haldol .   FEN - reg diet, bowel regimen DVT - SCDs, started subcu heparin  12/14, discussed with NS and ortho - ok for 325 mg ASA BID on DC for VTE prophylaxis this was started 12/17 ID - no current abx Foley - none currently, spont void Dispo - 6N, continue PT/OT, TOC working on SNF.  Patient is medically stable for discharge when a bed becomes available.  I reviewed nursing notes, last 24 h vitals and pain scores, last 48 h intake and output, last 24 h labs and trends, and last 24 h imaging results.    LOS: 11 days    Leonor LITTIE Dawn, MD [facility] Surgery 05/03/2024, 12:16 PM Please see Amion for pager number during day hours 7:00am-4:30pm  "

## 2024-05-03 NOTE — Progress Notes (Signed)
" °   05/03/24 0050  Vitals  Temp 98.5 F (36.9 C)  BP (!) 164/90  MAP (mmHg) 111  BP Location Right Arm  BP Method Automatic  Patient Position (if appropriate) Lying  Pulse Rate 89  Pulse Rate Source Monitor  MEWS COLOR  MEWS Score Color Green  Oxygen Therapy  SpO2 98 %  O2 Device Room Air  MEWS Score  MEWS Temp 0  MEWS Systolic 0  MEWS Pulse 0  MEWS RR 0  MEWS LOC 0  MEWS Score 0  Rapid Response Notification  Name of Rapid Response RN Notified David, RN  Date Rapid Response Notified 05/03/24  Time Rapid Response Notified 0051  Note  Patient Observations pt has facial twitching    "

## 2024-05-03 NOTE — Plan of Care (Signed)
" °  Problem: Education: Goal: Knowledge of General Education information will improve Description: Including pain rating scale, medication(s)/side effects and non-pharmacologic comfort measures Outcome: Not Progressing   Problem: Health Behavior/Discharge Planning: Goal: Ability to manage health-related needs will improve Outcome: Not Progressing   Problem: Safety: Goal: Ability to remain free from injury will improve Outcome: Progressing   Problem: Skin Integrity: Goal: Risk for impaired skin integrity will decrease Outcome: Progressing   Problem: Safety: Goal: Non-violent Restraint(s) Outcome: Not Progressing   "

## 2024-05-04 ENCOUNTER — Ambulatory Visit: Admitting: Internal Medicine

## 2024-05-04 MED ADMIN — Polyethylene Glycol 3350 Oral Packet 17 GM: 17 g | ORAL | NDC 00904693186

## 2024-05-04 MED ADMIN — Haloperidol Lactate Inj 5 MG/ML: 5 mg | INTRAVENOUS | NDC 67457042600

## 2024-05-04 NOTE — Progress Notes (Signed)
 Occupational Therapy Treatment Patient Details Name: Shirley Woods MRN: [id number] DOB: 05/07/1941 Today's Date: 05/04/2024   History of present illness Shirley Woods is an 83 y.o. female who fell out of a 3rd story window trying to escape from memory care unit at [facility]. Upon arrival found to have Bil LE fxs and now is s/p ORIF of right calcaneus and right subtalar joint dislocation; closed reduction of left calcaneus fx, right lateral malleolus fx; closed reduction and percutaneous fixation of left talonavicular joint subluxation; and percutaneous fixation of left navicular fx. 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 PHMx: dementia, prior ABI >20 years ago.   OT comments  Pt is progressing towards OT goals. Focus of session on increasing independence with ADL tasks and progressing functional mobility within BLE NWB precautions. Pt required up to Max A for functional transfers of lateral scoot to recliner as Pt unable to maintain NWB. Pt required up to Max A for ADL tasks this session. OT to continue per POC.       If plan is discharge home, recommend the following:  A lot of help with walking and/or transfers;A lot of help with bathing/dressing/bathroom;Help with stairs or ramp for entrance;Assist for transportation;Direct supervision/assist for financial management;Direct supervision/assist for medications management   Equipment Recommendations  Other (comment) (defer)    Recommendations for Other Services      Precautions / Restrictions Precautions Precautions: Fall Recall of Precautions/Restrictions: Impaired Precaution/Restrictions Comments: CAM boots when OOB, sundowning Restrictions Weight Bearing Restrictions Per Provider Order: Yes RLE Weight Bearing Per Provider Order: Non weight bearing LLE Weight Bearing Per Provider Order: Non weight bearing Other Position/Activity Restrictions: Per Shirley Moores, PA-C on 12/13 via  secure chat. That is fine to allow her to have her feet on the ground in the CAM boots to assist with lateral scoot but do not want her trying to stand and transfer.       Mobility Bed Mobility Overal bed mobility: Needs Assistance Bed Mobility: Rolling, Sidelying to Sit Rolling: Contact guard assist, Used rails Sidelying to sit: Min assist, Used rails       General bed mobility comments: Log roll for pt safety to R EOB to avoid pressure through her legs, pt initiates well to push up, minA for stability and safety, esp when scooting forward to keep her legs up and prevent accidental weight bearing through her feet.    Transfers Overall transfer level: Needs assistance Equipment used: Sliding board Transfers: Bed to chair/wheelchair/BSC            Lateral/Scoot Transfers: Max assist, Mod assist, +2 physical assistance, With slide board General transfer comment: Bed to wheelchair with slideboard toward her L side, pillow under pt CAM boots to prevent her from trying to push through her legs and for ease of scooting limbs as she used her arms to lift hips scooting toward wheelchair. Pt needing mod to maxA via bed pad to scoot to chair on her L side. Scooting toward her R side to get from wheelchair to drop arm recliner, again with slide board, multimodal cues for safe UE placement and assist to move limbs (with pillow under CAM boots for ease of sliding). Hoyer pad left under her hips once pt in chair for safety for staff to transfer her back from chair to bed.     Balance Overall balance assessment: Needs assistance Sitting-balance support: No upper extremity supported, Feet supported Sitting balance-Leahy Scale: Good Sitting balance -  Comments: impulsive with posture/anterior lean so supervision to CGA for safety. verbal cues while sitting EOB to not stand up       Standing balance comment: unable to stand due to NWB BLE orders                           ADL either  performed or assessed with clinical judgement   ADL Overall ADL's : Needs assistance/impaired     Grooming: Wash/dry face;Oral care;Set up;Sitting Grooming Details (indicate cue type and reason): sitting EOB             Lower Body Dressing: Maximal assistance;Bed level Lower Body Dressing Details (indicate cue type and reason): Max A to don CAM boots bed level. Pt was able to don briefs in long sitting position and position them around thighs. Required lateral leans and assistance to complete donning of briefs in sitting.                    Extremity/Trunk Assessment Upper Extremity Assessment Upper Extremity Assessment: Overall WFL for tasks assessed            Vision       Perception     Praxis     Communication Communication Communication: Impaired Factors Affecting Communication: Hearing impaired   Cognition Arousal: Alert Behavior During Therapy: Impulsive Cognition: History of cognitive impairments             OT - Cognition Comments: Pt requires continuous reminders of NWB precautions and mulitmodal cues for safety as Pt is impulsive. Pleasantly confused.                 Following commands: Impaired Following commands impaired: Only follows one step commands consistently      Cueing   Cueing Techniques: Verbal cues, Gestural cues, Visual cues  Exercises      Shoulder Instructions       General Comments VSS on RA    Pertinent Vitals/ Pain       Pain Assessment Pain Assessment: Faces Faces Pain Scale: No hurt Pain Intervention(s): Monitored during session  Home Living                                          Prior Functioning/Environment              Frequency  Min 2X/week        Progress Toward Goals  OT Goals(current goals can now be found in the care plan section)  Progress towards OT goals: Progressing toward goals     Plan      Co-evaluation    PT/OT/SLP Co-Evaluation/Treatment:  Yes Reason for Co-Treatment: Complexity of the patient's impairments (multi-system involvement);For patient/therapist safety;To address functional/ADL transfers PT goals addressed during session: Mobility/safety with mobility;Balance;Proper use of DME OT goals addressed during session: ADL's and self-care;Proper use of Adaptive equipment and DME;Strengthening/ROM      AM-PAC OT 6 Clicks Daily Activity     Outcome Measure   Help from another person eating meals?: A Little Help from another person taking care of personal grooming?: A Little Help from another person toileting, which includes using toliet, bedpan, or urinal?: Total Help from another person bathing (including washing, rinsing, drying)?: A Lot Help from another person to put on and taking off regular upper body clothing?: A Little Help from  another person to put on and taking off regular lower body clothing?: A Lot 6 Click Score: 14    End of Session Equipment Utilized During Treatment: Gait belt  OT Visit Diagnosis: Other abnormalities of gait and mobility (R26.89);Other symptoms and signs involving cognitive function;Pain   Activity Tolerance Patient tolerated treatment well   Patient Left in chair;with call bell/phone within reach;with chair alarm set;with nursing/sitter in room;with family/visitor present   Nurse Communication Mobility status        Time: 8484-8444 OT Time Calculation (min): 40 min  Charges: OT General Charges $OT Visit: 1 Visit OT Treatments $Self Care/Home Management : 8-22 mins  Maurilio CROME, OTR/L.  [facility] Acute Rehabilitation  Office: [phone]   Maurilio PARAS Jorge Retz 05/04/2024, 4:40 PM

## 2024-05-04 NOTE — Progress Notes (Signed)
 Physical Therapy Treatment Patient Details Name: Shirley Woods MRN: [id number] DOB: [birth year] Today's Date: 05/04/2024   History of Present Illness Shirley Woods is an 83 y.o. female who fell out of a 3rd story window trying to escape from memory care unit at [facility]. Upon arrival found to have Bil LE fxs and now is s/p ORIF of right calcaneus and right subtalar joint dislocation; closed reduction of left calcaneus fx, right lateral malleolus fx; closed reduction and percutaneous fixation of left talonavicular joint subluxation; and percutaneous fixation of left navicular fx. 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 PHMx: dementia, prior ABI >20 years ago.    PT Comments  Pt received in supine, agreeable to therapy session and with good participation and tolerance for transfer training, seated EOB self-care tasks and wheelchair mobility instruction in hallway. Pt able to propel wheelchair ~127ft with occasional minA to avoid obstacles and while turning in narrow areas of the room, and occasional multimodal cues using UE to propel chair more effectively, at times only Supervision needed. When scooting, pt needing modA +2 to maxA +2 to avoid accidental BLE weight bearing and for safety/stability when using slide board to scoot OOB to/from wheelchair and to drop arm recliner in her room. Hoyer pad under her for safety in recliner along with chair alarm for safety with return transfer, RN/NT aware. Patient will benefit from continued inpatient follow up therapy, <3 hours/day.   If plan is discharge home, recommend the following: A lot of help with bathing/dressing/bathroom;Assistance with cooking/housework;Direct supervision/assist for medications management;Direct supervision/assist for financial management;Assist for transportation;Help with stairs or ramp for entrance;Supervision due to cognitive status;Two people to help with walking and/or transfers    Can travel by private vehicle     No (ambulance transport 2/2 pt restlessness/impulsivity; may be too impulsive for [facility]  fleeta)  Equipment Recommendations  Other (comment);None recommended by PT (TBD at SNF, pt currently needs hospital bed and wheelchair with removable arm and leg rests, elevating leg rests, may need slide board vs hoyer)    Recommendations for Other Services       Precautions / Restrictions Precautions Precautions: Fall Recall of Precautions/Restrictions: Impaired Precaution/Restrictions Comments: CAM boots when OOB, sundowning Restrictions Weight Bearing Restrictions Per Provider Order: Yes RLE Weight Bearing Per Provider Order: Non weight bearing LLE Weight Bearing Per Provider Order: Non weight bearing Other Position/Activity Restrictions: Per Camie Moores, PA-C on 12/13 via secure chat. That is fine to allow her to have her feet on the ground in the CAM boots to assist with lateral scoot but do not want her trying to stand and transfer.     Mobility  Bed Mobility Overal bed mobility: Needs Assistance Bed Mobility: Rolling, Sidelying to Sit Rolling: Contact guard assist, Used rails Sidelying to sit: Min assist, Used rails       General bed mobility comments: Log roll for pt safety to R EOB to avoid pressure through her legs, pt initiates well to push up, minA for stability and safety, esp when scooting forward to keep her legs up and prevent accidental weight bearing through her feet.    Transfers Overall transfer level: Needs assistance Equipment used: Sliding board Transfers: Bed to chair/wheelchair/BSC            Lateral/Scoot Transfers: Max assist, Mod assist, +2 physical assistance, With slide board General transfer comment: Bed to wheelchair with slideboard toward her L side, pillow under pt CAM boots to prevent her from trying  to push through her legs and for ease of scooting limbs as she used her arms to lift hips scooting toward wheelchair.  Pt needing mod to maxA via bed pad to scoot to chair on her L side. Scooting toward her R side to get from wheelchair to drop arm recliner, again with slide board, multimodal cues for safe UE placement and assist to move limbs (with pillow under CAM boots for ease of sliding). Hoyer pad left under her hips once pt in chair for safety for staff to transfer her back from chair to bed.    Ambulation/Gait                   Psychologist, Counselling mobility: Yes Wheelchair propulsion: Both upper extremities Wheelchair parts: Needs assistance Distance: 100 Wheelchair Assistance Details (indicate cue type and reason): Initial cues for use of brakes and hand over hand assist to locate wheel rails. Pt given initial visual/verbal cues for technique to propel and turn in wheelchair, with fair carryover. Pt tending to very small pushes to propel chair, wtih dense cues able to push wtih more effective technique, but then goes back to very small pushes with minimal shoulder extension reaching back. Good effort throughout. SpO2 WFL on RA with exertion, HR to 118 bpm.   Tilt Bed    Modified Rankin (Stroke Patients Only)       Balance Overall balance assessment: Needs assistance Sitting-balance support: No upper extremity supported, Feet supported Sitting balance-Leahy Scale: Good Sitting balance - Comments: impulsive with posture/anterior lean so supervision to CGA for safety       Standing balance comment: unable to stand due to NWB BLE orders                            Communication Communication Communication: Impaired Factors Affecting Communication: Hearing impaired  Cognition Arousal: Alert Behavior During Therapy: Restless, WFL for tasks assessed/performed   PT - Cognitive impairments: History of cognitive impairments, Attention, Sequencing, Problem solving, Safety/Judgement                       PT -  Cognition Comments: pt pleasant and eager to mobilize. pt was able to follow simple commands with multimodal cues, assist to maintain bilat LE NWB as pt forgets when rolling and scooting. Pt somewhat restless and benefits from multimodal cues to prevent accidental weight bearing through limbs while sitting/scooting. Pt also has a recruitment consultant and her spouse in room to redirect her once she is up in recliner. Following commands: Impaired Following commands impaired: Only follows one step commands consistently, Follows multi-step commands inconsistently    Cueing Cueing Techniques: Verbal cues, Gestural cues, Tactile cues, Visual cues  Exercises Other Exercises Other Exercises: chair push-ups x5 in recliner, pt able to clear hips fully on a few reps Other Exercises: seated BLE AROM: LAQ x5 reps ea    General Comments General comments (skin integrity, edema, etc.): SpO2 WFL on RA, HR 110-118 bpm with WC mobility. Briefs donned on patient prior to her scooting OOB to wheelchair for pt safety, pt also has hospital mesh briefs on and purewick, purewick removed prior to OOB scooting, NT sitter notified and aware.      Pertinent Vitals/Pain Pain Assessment Pain Assessment: PAINAD Breathing: normal Negative Vocalization: none Facial Expression: smiling or inexpressive Body Language: tense, distressed  pacing, fidgeting Consolability: distracted or reassured by voice/touch PAINAD Score: 2 Pain Intervention(s): Limited activity within patient's tolerance, Monitored during session, Repositioned, Patient requesting pain meds-RN notified    Home Living                          Prior Function            PT Goals (current goals can now be found in the care plan section) Acute Rehab PT Goals Patient Stated Goal: Patient eager to work on getting OOB and to prepare for post-discharge mobility Progress towards PT goals: Progressing toward goals    Frequency    Min 3X/week      PT  Plan      Co-evaluation PT/OT/SLP Co-Evaluation/Treatment: Yes Reason for Co-Treatment: Complexity of the patient's impairments (multi-system involvement);For patient/therapist safety;To address functional/ADL transfers PT goals addressed during session: Mobility/safety with mobility;Balance;Proper use of DME        AM-PAC PT 6 Clicks Mobility   Outcome Measure  Help needed turning from your back to your side while in a flat bed without using bedrails?: A Little Help needed moving from lying on your back to sitting on the side of a flat bed without using bedrails?: A Little Help needed moving to and from a bed to a chair (including a wheelchair)?: A Lot Help needed standing up from a chair using your arms (e.g., wheelchair or bedside chair)?: Total Help needed to walk in hospital room?: Total Help needed climbing 3-5 steps with a railing? : Total 6 Click Score: 11    End of Session Equipment Utilized During Treatment: Other (comment) (bil CAM boots donned) Activity Tolerance: Patient tolerated treatment well Patient left: in chair;with call bell/phone within reach;with chair alarm set;with family/visitor present;Other (comment);with nursing/sitter in room (sitter present along with pt spouse) Nurse Communication: Mobility status;Need for lift equipment;Other (comment);Weight bearing status;Patient requests pain meds (hoyer pad under her to return to bed, esp if agitated; NT sitter in room and notified to get assist if she needs to get back to bed via hoyer or scoot only if +2 available and pillow under her CAM boots to avoid accidental weight bearing) PT Visit Diagnosis: Pain;Other abnormalities of gait and mobility (R26.89) Pain - part of body: Ankle and joints of foot     Time: 8484-8445 PT Time Calculation (min) (ACUTE ONLY): 39 min  Charges:    $Therapeutic Activity: 8-22 mins $Wheel Chair Management: 8-22 mins PT General Charges $$ ACUTE PT VISIT: 1 Visit                      Alandis Bluemel P., PTA Acute Rehabilitation Services Secure Chat Preferred 9a-5:30pm Office: [phone]    Connell HERO [facility] 05/04/2024, 4:17 PM

## 2024-05-04 NOTE — Plan of Care (Signed)
   Problem: Activity: Goal: Risk for activity intolerance will decrease Outcome: Progressing   Problem: Nutrition: Goal: Adequate nutrition will be maintained Outcome: Progressing

## 2024-05-04 NOTE — Plan of Care (Signed)
   Problem: Activity: Goal: Risk for activity intolerance will decrease Outcome: Progressing   Problem: Nutrition: Goal: Adequate nutrition will be maintained Outcome: Progressing   Problem: Coping: Goal: Level of anxiety will decrease Outcome: Progressing   Problem: Safety: Goal: Ability to remain free from injury will improve Outcome: Progressing

## 2024-05-04 NOTE — Progress Notes (Signed)
 Bilateral lower extremity dressings changed per order. The patient has been agitated  and restless since the fire alarm went off earlier. Trying to get out of bed and wanting to find her husband. Attempted diversion with little improvement. PRN med given, sat with patient  and talked with her until she calmed down. Now resting with eyes closed.

## 2024-05-04 NOTE — Progress Notes (Signed)
 "  Progress Note  11 Days Post-Op  Subjective: Patient is pleasant and participates in conversation throughout encounter. She is however confused. Sitter at bedside.  Reports bowel function. Denies flatulence. Tolerating PO intake but did not have breakfast this morning as she did not have an appetite. Denies pain, n/v, SOB, CP.   No facial twitching noted. Urine output recorded 200 mL  ROS  All negative with the exception of above.  Objective: Vital signs in last 24 hours: Temp:  [97.6 F (36.4 C)-98.6 F (37 C)] 98 F (36.7 C) (12/23 0731) Pulse Rate:  [82-104] 90 (12/23 0731) Resp:  [18] 18 (12/23 0731) BP: (114-159)/(66-80) 119/73 (12/23 0731) SpO2:  [95 %-98 %] 98 % (12/23 0731) Last BM Date : 05/03/24  Intake/Output from previous day: 12/22 0701 - 12/23 0700 In: 710 [P.O.:710] Out: 200 [Urine:200] Intake/Output this shift: No intake/output data recorded.  PE: General: Pleasant female who is laying in bed in NAD. HEENT: Head is normocephalic, atraumatic.   Heart: HR normal during encounter. Palpable radial pulses bilaterally. Palpable pulses bilaterally of lower extremities at popliteal region. Lungs: CTAB, no wheezes, rhonchi, or rales noted.  Respiratory effort nonlabored on room air.  Abd: Soft, NT, ND. No rebound tenderness or guarding. MS: Dressings in place of bilateral lower extremities. Ecchymosis noted of lower extremities around shins/ankles. Able to move all extremities and they are all warm and well-perfused.  Psych: Alert and calm. Disoriented.    Lab Results:  No results for input(s): WBC, HGB, HCT, PLT in the last 72 hours. BMET No results for input(s): NA, K, CL, CO2, GLUCOSE, BUN, CREATININE, CALCIUM in the last 72 hours. PT/INR No results for input(s): LABPROT, INR in the last 72 hours. CMP     Component Value Date/Time   NA 137 04/30/2024 1305   K 4.1 04/30/2024 1305   CL 105 04/30/2024 1305   CO2 22 04/30/2024  1305   GLUCOSE 100 (H) 04/30/2024 1305   BUN 15 04/30/2024 1305   CREATININE 0.74 04/30/2024 1305   CALCIUM 9.3 04/30/2024 1305   PROT 6.8 04/22/2024 1840   ALBUMIN 3.8 04/22/2024 1840   AST 48 (H) 04/22/2024 1840   ALT 34 04/22/2024 1840   ALKPHOS 73 04/22/2024 1840   BILITOT 0.8 04/22/2024 1840   GFRNONAA >60 04/30/2024 1305   Lipase  No results found for: LIPASE     Studies/Results: No results found.  Anti-infectives: Anti-infectives (From admission, onward)    Start     Dose/Rate Route Frequency Ordered Stop   04/23/24 2000  ceFAZolin  (ANCEF ) IVPB 2g/100 mL premix        2 g 200 mL/hr over 30 Minutes Intravenous Every 8 hours 04/23/24 1509 04/24/24 1259   04/23/24 1259  vancomycin  (VANCOCIN ) powder  Status:  Discontinued          As needed 04/23/24 1259 04/23/24 1338   04/23/24 0915  ceFAZolin  (ANCEF ) IVPB 2g/100 mL premix        2 g 200 mL/hr over 30 Minutes Intravenous To ShortStay Surgical 04/23/24 0858 04/23/24 1304   04/23/24 0907  ceFAZolin  (ANCEF ) 2-4 GM/100ML-% IVPB       Note to Pharmacy: Jonda Yancy HERO: cabinet override      04/23/24 0907 04/23/24 1234   04/22/24 1845  ceFAZolin  (ANCEF ) IVPB 2g/100 mL premix        2 g 200 mL/hr over 30 Minutes Intravenous  Once 04/22/24 1836 04/22/24 1908        Assessment/Plan  Jump from 3rd floor window at memory care   Open R ankle fx, B calcaneus fx, L 2nd metatarsal fx - ortho c/s, Dr. Fidel, handoff to Dr. Kendal, ancef  given in TB, OR 12/12 Dr Kendal - ORIF Rt calcaneus, Closed treatment of left calcaneus fracture, right lateral malleolus fracture & Closed reduction and percutaneous fixation of left talonavicular joint subluxation, Percutaneous fixation of left navicular fracture - NWB b/l LE- discussed with NS and ortho - ok for 325 mg ASA BID on DC for VTE prophylaxis.  L SDH - NSGY c/s, Dr. Lanis, keppra  x7d for sz ppx (completed), repeat head CT stable. ?L CFV DVT - duplex done and no DVT therefore  ?RLL subsegmental PE is artifact. Urinary retention - Flomax ; pt removed foley 12/15; voiding spontaneously. Agitation - Home Zoloft  and Lamictal . Seroquel  increased 12/21 to 50mg  morning and afternoon and 100mg  at bedtime. Not agitated during this encounter. Home dose of PO ativan  0.5mg  q6h prn resumed. IV haldol  was discontinued.   FEN - Reg diet, bowel regimen DVT - SCDs, started subcu heparin  12/14, discussed with NS and ortho - ok for 325 mg ASA BID on DC for VTE prophylaxis this was started 12/17. ID - No current abx Foley - None currently, spont void Dispo - 6N, continue PT/OT, TOC working on SNF. Patient is medically stable for discharge when a bed becomes available.   LOS: 12 days   I reviewed specialist notes, nursing notes, last 24 h vitals and pain scores, last 48 h intake and output, last 24 h labs and trends, and last 24 h imaging results.  This care required moderate level of medical decision making.   Marjorie Carlyon Favre, [facility] Surgery 05/04/2024, 11:31 AM Please see Amion for pager number during day hours 7:00am-4:30pm  "

## 2024-05-05 MED ADMIN — Polyethylene Glycol 3350 Oral Packet 17 GM: 17 g | ORAL | NDC 00904693186

## 2024-05-05 NOTE — Plan of Care (Signed)

## 2024-05-05 NOTE — Progress Notes (Signed)
 12 Days Post-Op   Subjective/Chief Complaint: Pleasantly confused. No complaints   Objective: Vital signs in last 24 hours: Temp:  [97.6 F (36.4 C)-98.3 F (36.8 C)] 97.8 F (36.6 C) (12/24 0817) Pulse Rate:  [75-107] 75 (12/24 0817) Resp:  [16-18] 16 (12/24 0817) BP: (121-145)/(68-74) 145/72 (12/24 0817) SpO2:  [94 %-96 %] 95 % (12/24 0817) Last BM Date : 05/04/24  Intake/Output from previous day: 12/23 0701 - 12/24 0700 In: 360 [P.O.:360] Out: 851 [Urine:850; Stool:1] Intake/Output this shift: No intake/output data recorded.  General appearance: alert and cooperative Resp: clear to auscultation bilaterally Cardio: regular rate and rhythm GI: soft, nontender Extremities: moves toes. Toes warm  Lab Results:  No results for input(s): WBC, HGB, HCT, PLT in the last 72 hours. BMET No results for input(s): NA, K, CL, CO2, GLUCOSE, BUN, CREATININE, CALCIUM in the last 72 hours. PT/INR No results for input(s): LABPROT, INR in the last 72 hours. ABG No results for input(s): PHART, HCO3 in the last 72 hours.  Invalid input(s): PCO2, PO2  Studies/Results: No results found.  Anti-infectives: Anti-infectives (From admission, onward)    Start     Dose/Rate Route Frequency Ordered Stop   04/23/24 2000  ceFAZolin  (ANCEF ) IVPB 2g/100 mL premix        2 g 200 mL/hr over 30 Minutes Intravenous Every 8 hours 04/23/24 1509 04/24/24 1259   04/23/24 1259  vancomycin  (VANCOCIN ) powder  Status:  Discontinued          As needed 04/23/24 1259 04/23/24 1338   04/23/24 0915  ceFAZolin  (ANCEF ) IVPB 2g/100 mL premix        2 g 200 mL/hr over 30 Minutes Intravenous To ShortStay Surgical 04/23/24 0858 04/23/24 1304   04/23/24 0907  ceFAZolin  (ANCEF ) 2-4 GM/100ML-% IVPB       Note to Pharmacy: Jonda Yancy HERO: cabinet override      04/23/24 0907 04/23/24 1234   04/22/24 1845  ceFAZolin  (ANCEF ) IVPB 2g/100 mL premix        2 g 200 mL/hr over 30 Minutes  Intravenous  Once 04/22/24 1836 04/22/24 1908       Assessment/Plan: s/p Procedures: OPEN REDUCTION INTERNAL FIXATION (ORIF) CALCANEOUS FRACTURE (Right) PINNING, EXTREMITY, PERCUTANEOUS (Left) IRRIGATION AND DEBRIDEMENT FOOT (Right) Advance diet Jump from 3rd floor window at memory care   Open R ankle fx, B calcaneus fx, L 2nd metatarsal fx - ortho c/s, Dr. Fidel, handoff to Dr. Kendal, ancef  given in TB, OR 12/12 Dr Kendal - ORIF Rt calcaneus, Closed treatment of left calcaneus fracture, right lateral malleolus fracture & Closed reduction and percutaneous fixation of left talonavicular joint subluxation, Percutaneous fixation of left navicular fracture - NWB b/l LE- discussed with NS and ortho - ok for 325 mg ASA BID on DC for VTE prophylaxis.  L SDH - NSGY c/s, Dr. Lanis, keppra  x7d for sz ppx (completed), repeat head CT stable. ?L CFV DVT - duplex done and no DVT therefore ?RLL subsegmental PE is artifact. Urinary retention - Flomax ; pt removed foley 12/15; voiding spontaneously. Agitation - Home Zoloft  and Lamictal . Seroquel  increased 12/21 to 50mg  morning and afternoon and 100mg  at bedtime. Not agitated during this encounter. Home dose of PO ativan  0.5mg  q6h prn resumed. IV haldol  was discontinued.   FEN - Reg diet, bowel regimen DVT - SCDs, started subcu heparin  12/14, discussed with NS and ortho - ok for 325 mg ASA BID on DC for VTE prophylaxis this was started 12/17. ID - No current abx  Foley - None currently, spont void Dispo - 6N, continue PT/OT, TOC working on SNF. Patient is medically stable for discharge when a bed becomes available.  LOS: 13 days    Shirley Woods 05/05/2024

## 2024-05-05 NOTE — Plan of Care (Signed)
  Problem: Clinical Measurements: Goal: Diagnostic test results will improve Outcome: Progressing   Problem: Activity: Goal: Risk for activity intolerance will decrease Outcome: Progressing   Problem: Coping: Goal: Level of anxiety will decrease Outcome: Progressing   Problem: Pain Managment: Goal: General experience of comfort will improve and/or be controlled Outcome: Progressing

## 2024-05-05 NOTE — Progress Notes (Signed)
 Patient had a sitter last night, however, she slept most of the night. No PRN meds were given and no restraints were used. Only refused one dose of tylenol . Stated that she didn't need it. All other meds were taken and tolerated well.

## 2024-05-06 MED ORDER — POLYETHYLENE GLYCOL 3350 17 G PO PACK
17.0000 g | PACK | Freq: Every day | ORAL | Status: DC
  Administered 2024-05-08 – 2024-05-17 (×10): 17 g via ORAL
  Filled 2024-05-06 (×12): qty 1

## 2024-05-06 MED ADMIN — Polyethylene Glycol 3350 Oral Packet 17 GM: 17 g | ORAL | NDC 00904693186

## 2024-05-06 NOTE — Plan of Care (Signed)
  Problem: Health Behavior/Discharge Planning: Goal: Ability to manage health-related needs will improve Outcome: Progressing   Problem: Clinical Measurements: Goal: Respiratory complications will improve Outcome: Progressing   Problem: Activity: Goal: Risk for activity intolerance will decrease Outcome: Progressing   

## 2024-05-06 NOTE — Progress Notes (Signed)
 13 Days Post-Op   Subjective/Chief Complaint: Pleasantly confused. No complaints   Objective: Vital signs in last 24 hours: Temp:  [97.5 F (36.4 C)-98.1 F (36.7 C)] 97.5 F (36.4 C) (12/24 2307) Pulse Rate:  [102-104] 104 (12/24 2307) Resp:  [18] 18 (12/24 2307) BP: (139-140)/(66-71) 139/66 (12/24 2307) SpO2:  [94 %-95 %] 94 % (12/24 2307) Last BM Date : 05/05/24  Intake/Output from previous day: 12/24 0701 - 12/25 0700 In: 360 [P.O.:360] Out: 450 [Urine:450] Intake/Output this shift: No intake/output data recorded.  General appearance: alert and cooperative Resp: clear to auscultation bilaterally Cardio: regular rate and rhythm GI: soft, nontender Extremities: moves toes. Toes warm  Lab Results:  No results for input(s): WBC, HGB, HCT, PLT in the last 72 hours. BMET No results for input(s): NA, K, CL, CO2, GLUCOSE, BUN, CREATININE, CALCIUM in the last 72 hours. PT/INR No results for input(s): LABPROT, INR in the last 72 hours. ABG No results for input(s): PHART, HCO3 in the last 72 hours.  Invalid input(s): PCO2, PO2  Studies/Results: No results found.  Anti-infectives: Anti-infectives (From admission, onward)    Start     Dose/Rate Route Frequency Ordered Stop   04/23/24 2000  ceFAZolin  (ANCEF ) IVPB 2g/100 mL premix        2 g 200 mL/hr over 30 Minutes Intravenous Every 8 hours 04/23/24 1509 04/24/24 1259   04/23/24 1259  vancomycin  (VANCOCIN ) powder  Status:  Discontinued          As needed 04/23/24 1259 04/23/24 1338   04/23/24 0915  ceFAZolin  (ANCEF ) IVPB 2g/100 mL premix        2 g 200 mL/hr over 30 Minutes Intravenous To ShortStay Surgical 04/23/24 0858 04/23/24 1304   04/23/24 0907  ceFAZolin  (ANCEF ) 2-4 GM/100ML-% IVPB       Note to Pharmacy: Jonda Yancy HERO: cabinet override      04/23/24 0907 04/23/24 1234   04/22/24 1845  ceFAZolin  (ANCEF ) IVPB 2g/100 mL premix        2 g 200 mL/hr over 30 Minutes  Intravenous  Once 04/22/24 1836 04/22/24 1908       Assessment/Plan: s/p Procedures: OPEN REDUCTION INTERNAL FIXATION (ORIF) CALCANEOUS FRACTURE (Right) PINNING, EXTREMITY, PERCUTANEOUS (Left) IRRIGATION AND DEBRIDEMENT FOOT (Right) Advance diet Jump from 3rd floor window at memory care 04/22/2008   Open R ankle fx, B calcaneus fx, L 2nd metatarsal fx - ortho c/s, Dr. Fidel, handoff to Dr. Kendal, ancef  given in TB, OR 12/12 Dr Kendal - ORIF Rt calcaneus, Closed treatment of left calcaneus fracture, right lateral malleolus fracture & Closed reduction and percutaneous fixation of left talonavicular joint subluxation, Percutaneous fixation of left navicular fracture - NWB b/l LE- discussed with NS and ortho - ok for 325 mg ASA BID on DC for VTE prophylaxis.  L SDH - NSGY c/s, Dr. Lanis, keppra  x7d for sz ppx (completed), repeat head CT stable. ?L CFV DVT - duplex done and no DVT therefore ?RLL subsegmental PE is artifact. Urinary retention - Flomax ; pt removed foley 12/15; voiding spontaneously.  Agitation - Home Zoloft  and Lamictal . Seroquel  increased 12/21 to 50mg  morning and afternoon and 100mg  at bedtime. Home dose of PO ativan  0.5mg  q6h prn resumed. IV haldol  was discontinued.  This had been biggest issue.  Not agitated yesterday or today.    FEN - Reg diet, bowel regimen. Stooling.   DVT - SCDs, started subcu heparin  12/14, discussed with NS and ortho - ok for 325 mg ASA BID on DC  for VTE prophylaxis this was started 12/17. ID - No current abx Foley - None currently, spont void  Dispo - 6N, continue PT/OT, TOC working on SNF. Patient is medically stable for discharge when a bed becomes available.   LOS: 14 days   Jina LITTIE Nephew, MD, FACS, FSSO Surgical Oncology, General Surgery, Trauma and Critical [facility], [place] [phone] for weekday/non holidays Check [email or web address] for coverage night/weekend/holidays under General Surgery     05/06/2024

## 2024-05-06 NOTE — Plan of Care (Signed)

## 2024-05-07 MED ADMIN — Lorazepam Inj 2 MG/ML: 0.5 mg | INTRAVENOUS | NDC 65219036801

## 2024-05-07 NOTE — TOC Progression Note (Signed)
 Transition of Care [facility]) - Progression Note    Patient Details  Name: Shirley Woods MRN: [id number] Date of Birth: [birth year]  [facility] of [facility]) CM/SW Contact  Carmelita FORBES Carbon, LCSW Phone Number: 05/07/2024, 9:23 AM  Clinical Narrative:    CSW noted order for sitter today. This is a barrier to patient returning to [facility].     Expected Discharge Plan: Skilled Nursing Facility Barriers to Discharge: Continued Medical Work up               Expected Discharge Plan and Services   Discharge Planning Services: CM Consult   Living arrangements for the past 2 months: Assisted Living Facility (Memory Care)                                       Social Drivers of Health (SDOH) Interventions SDOH Screenings   Food Insecurity: No Food Insecurity (04/23/2024)  Housing: Low Risk (04/23/2024)  Transportation Needs: No Transportation Needs (04/23/2024)  Utilities: Not At Risk (04/23/2024)  Social Connections: Patient Unable To Answer (04/23/2024)    Readmission Risk Interventions     No data to display

## 2024-05-07 NOTE — Plan of Care (Signed)

## 2024-05-07 NOTE — Progress Notes (Addendum)
 14 Days Post-Op   Subjective/Chief Complaint: Pleasantly confused. No complaints Just woke up, about to eat breakfast  Objective: Vital signs in last 24 hours: Temp:  [98.2 F (36.8 C)-98.5 F (36.9 C)] 98.5 F (36.9 C) (12/26 1052) Pulse Rate:  [82-100] 82 (12/26 1052) Resp:  [16-17] 16 (12/26 1052) BP: (140-160)/(54-76) 140/54 (12/26 1052) SpO2:  [95 %-98 %] 97 % (12/26 1052) Last BM Date : 05/05/24  Intake/Output from previous day: 12/25 0701 - 12/26 0700 In: 240 [P.O.:240] Out: -  Intake/Output this shift: No intake/output data recorded.  General appearance: alert and cooperative Cardio: regular rate and rhythm GI: soft, non-tender; bowel sounds normal; no masses,  no organomegaly and soft, nontender Extremities: moves toes. Toes warm  Anti-infectives: Anti-infectives (From admission, onward)    Start     Dose/Rate Route Frequency Ordered Stop   04/23/24 2000  ceFAZolin  (ANCEF ) IVPB 2g/100 mL premix        2 g 200 mL/hr over 30 Minutes Intravenous Every 8 hours 04/23/24 1509 04/24/24 1259   04/23/24 1259  vancomycin  (VANCOCIN ) powder  Status:  Discontinued          As needed 04/23/24 1259 04/23/24 1338   04/23/24 0915  ceFAZolin  (ANCEF ) IVPB 2g/100 mL premix        2 g 200 mL/hr over 30 Minutes Intravenous To ShortStay Surgical 04/23/24 0858 04/23/24 1304   04/23/24 0907  ceFAZolin  (ANCEF ) 2-4 GM/100ML-% IVPB       Note to Pharmacy: Jonda Yancy HERO: cabinet override      04/23/24 0907 04/23/24 1234   04/22/24 1845  ceFAZolin  (ANCEF ) IVPB 2g/100 mL premix        2 g 200 mL/hr over 30 Minutes Intravenous  Once 04/22/24 1836 04/22/24 1908       Assessment/Plan: s/p Procedures: OPEN REDUCTION INTERNAL FIXATION (ORIF) CALCANEOUS FRACTURE (Right) PINNING, EXTREMITY, PERCUTANEOUS (Left) IRRIGATION AND DEBRIDEMENT FOOT (Right) Advance diet Jump from 3rd floor window at memory care 04/22/2008   Open R ankle fx, B calcaneus fx, L 2nd metatarsal fx - ortho c/s, Dr.  Fidel, handoff to Dr. Kendal, ancef  given in TB, OR 12/12 Dr Kendal - ORIF Rt calcaneus, Closed treatment of left calcaneus fracture, right lateral malleolus fracture & Closed reduction and percutaneous fixation of left talonavicular joint subluxation, Percutaneous fixation of left navicular fracture - NWB b/l LE- discussed with NS and ortho - ok for 325 mg ASA BID on DC for VTE prophylaxis.  L SDH - NSGY c/s, Dr. Lanis, keppra  x7d for sz ppx (completed), repeat head CT stable. ?L CFV DVT - duplex done and no DVT therefore ?RLL subsegmental PE is artifact. Urinary retention - Flomax ; pt removed foley 12/15; voiding spontaneously.  Agitation - Home Zoloft  and Lamictal . Seroquel  increased 12/21 to 50mg  morning and afternoon and 100mg  at bedtime. Home dose of PO ativan  0.5mg  q6h prn resumed. IV haldol  was discontinued.  This had been biggest issue.  Not agitated yesterday or today.    FEN - Reg diet, bowel regimen. Stooling.   DVT - SCDs, started subcu heparin  12/14, discussed with NS and ortho - ok for 325 mg ASA BID on DC for VTE prophylaxis this was started 12/17. ID - No current abx Foley - None currently, spont void  Dispo - 6N, continue PT/OT, TOC working on SNF. Patient is medically stable for discharge when a bed becomes available.   LOS: 15 days   Shirley Woods, [facility] Surgery Please see Amion for pager  number during day hours 7:00am-4:30pm         05/07/2024

## 2024-05-07 NOTE — Progress Notes (Signed)
 Physical Therapy Treatment Patient Details Name: Shirley Woods MRN: [id number] DOB: [birth year] Today's Date: 05/07/2024   History of Present Illness Shirley Woods is an 83 y.o. female who fell out of a 3rd story window trying to escape from memory care unit at [facility]. Upon arrival found to have Bil LE fxs and now is s/p ORIF of right calcaneus and right subtalar joint dislocation; closed reduction of left calcaneus fx, right lateral malleolus fx; closed reduction and percutaneous fixation of left talonavicular joint subluxation; and percutaneous fixation of left navicular fx. 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 PHMx: dementia, prior ABI >20 years ago.    PT Comments  Pt received in supine, spouse present, pt c/o L lower back pain, RN notified/aware. Pt needing up to minA for bed mobility and min to modA +1 for lateral seated scoot transfer from bed to drop arm chair on her L side. Emphasis on seated UE exercises/chair push-ups and seated BLE exercises for strengthening while CAM boots remain donned for pt safety while performing. Pt agreeable to try sitting in chair 1 hour or more at end of session, discussed with pt/spouse and RN x2 notified of safe technique to transfer her to drop arm BSC or bed later once pt ready to return to bed. Patient will benefit from continued inpatient follow up therapy, <3 hours/day.    If plan is discharge home, recommend the following: A lot of help with bathing/dressing/bathroom;Assistance with cooking/housework;Direct supervision/assist for medications management;Direct supervision/assist for financial management;Assist for transportation;Help with stairs or ramp for entrance;Supervision due to cognitive status;Two people to help with walking and/or transfers   Can travel by private vehicle     No  Equipment Recommendations  Other (comment);None recommended by PT (TBD at SNF, pt currently needs hospital bed  and wheelchair with removable arm and leg rests, elevating leg rests, may need slide board vs hoyer)    Recommendations for Other Services       Precautions / Restrictions Precautions Precautions: Fall Recall of Precautions/Restrictions: Impaired Precaution/Restrictions Comments: CAM boots when OOB, sundowning Restrictions Weight Bearing Restrictions Per Provider Order: Yes RLE Weight Bearing Per Provider Order: Non weight bearing LLE Weight Bearing Per Provider Order: Non weight bearing Other Position/Activity Restrictions: Per Camie Moores, PA-C on 12/13 via secure chat. That is fine to allow her to have her feet on the ground in the CAM boots to assist with lateral scoot but do not want her trying to stand and transfer.     Mobility  Bed Mobility Overal bed mobility: Needs Assistance Bed Mobility: Rolling, Sidelying to Sit Rolling: Contact guard assist Sidelying to sit: Contact guard assist, Used rails       General bed mobility comments: Cues for log roll; to L EOB; pt c/o L back pain throughout.    Transfers Overall transfer level: Needs assistance Equipment used: None Transfers: Bed to chair/wheelchair/BSC            Lateral/Scoot Transfers: Min assist, Mod assist, From elevated surface General transfer comment: Bed to recliner toward her L side, pillow under pt CAM boots to prevent her from trying to push through her legs and for ease of scooting limbs as she used her arms to lift hips scooting toward chair. Pt needs reminders for safe UE placement and to avoid excessive forward trunk flexion as she is also c/o L lower back pain today.    Ambulation/Gait  Stairs             Wheelchair Mobility     Tilt Bed    Modified Rankin (Stroke Patients Only)       Balance Overall balance assessment: Needs assistance Sitting-balance support: No upper extremity supported, Feet supported Sitting balance-Leahy Scale: Good Sitting  balance - Comments: impulsive with posture/anterior lean so supervision to CGA for safety. verbal cues while sitting EOB to avoid pushing through BLE and to avoid flexing forward so far.                                    Communication Communication Communication: Impaired Factors Affecting Communication: Hearing impaired  Cognition Arousal: Alert Behavior During Therapy: Restless, WFL for tasks assessed/performed   PT - Cognitive impairments: History of cognitive impairments, Attention, Sequencing, Problem solving, Safety/Judgement                       PT - Cognition Comments: pt pleasant and eager to mobilize. pt was able to follow simple commands with multimodal cues, assist to maintain bilat LE NWB as pt forgets when scooting. Pt somewhat restless and benefits from multimodal cues to prevent accidental weight bearing through limbs while scooting; spouse in room, no sitter present in room today. Following commands: Impaired Following commands impaired: Only follows one step commands consistently    Cueing Cueing Techniques: Verbal cues, Gestural cues, Tactile cues, Visual cues  Exercises Other Exercises Other Exercises: chair push-ups x10 reps in recliner, pt able to clear hips fully each rep Other Exercises: seated BLE AROM: LAQ and hip flexion x20 reps ea, hip ADduction pillow squeezes x10 reps    General Comments General comments (skin integrity, edema, etc.): SpO2 WFL on RA, HR WFL per finger pulse ox      Pertinent Vitals/Pain Pain Assessment Pain Assessment: PAINAD Breathing: normal Negative Vocalization: occasional moan/groan, low speech, negative/disapproving quality Facial Expression: facial grimacing Body Language: tense, distressed pacing, fidgeting Consolability: distracted or reassured by voice/touch PAINAD Score: 5 Pain Intervention(s): Limited activity within patient's tolerance, Monitored during session, Repositioned, Patient requesting  pain meds-RN notified, Ice applied (pt c/o L lower back pain)    Home Living                          Prior Function            PT Goals (current goals can now be found in the care plan section) Acute Rehab PT Goals Patient Stated Goal: Patient eager to work on getting OOB and to prepare for post-discharge mobility Progress towards PT goals: Progressing toward goals    Frequency    Min 3X/week      PT Plan      Co-evaluation              AM-PAC PT 6 Clicks Mobility   Outcome Measure  Help needed turning from your back to your side while in a flat bed without using bedrails?: A Little Help needed moving from lying on your back to sitting on the side of a flat bed without using bedrails?: A Little Help needed moving to and from a bed to a chair (including a wheelchair)?: A Lot Help needed standing up from a chair using your arms (e.g., wheelchair or bedside chair)?: Total Help needed to walk in hospital room?: Total Help needed climbing 3-5 steps  with a railing? : Total 6 Click Score: 11    End of Session Equipment Utilized During Treatment: Other (comment);Gait belt (bil CAM boots donned) Activity Tolerance: Patient tolerated treatment well;Patient limited by pain;Other (comment) (c/o low back pain) Patient left: in chair;with call bell/phone within reach;with chair alarm set;Other (comment);with family/visitor present (spouse in room) Nurse Communication: Mobility status;Need for lift equipment;Other (comment);Weight bearing status;Patient requests pain meds (hoyer pad under her to return to bed, esp if agitated; RN notified for safe technique if pt scooting to drop arm BSC vs bed from drop arm chair, pt c/o back pain) PT Visit Diagnosis: Pain;Other abnormalities of gait and mobility (R26.89) Pain - part of body:  (back)     Time: 8469-8446 PT Time Calculation (min) (ACUTE ONLY): 23 min  Charges:    $Therapeutic Exercise: 8-22 mins $Therapeutic  Activity: 8-22 mins PT General Charges $$ ACUTE PT VISIT: 1 Visit                     Franciscojavier Wronski P., PTA Acute Rehabilitation Services Secure Chat Preferred 9a-5:30pm Office: [phone]    Connell HERO [facility] 05/07/2024, 4:04 PM

## 2024-05-07 NOTE — Plan of Care (Signed)

## 2024-05-08 NOTE — Plan of Care (Signed)

## 2024-05-08 NOTE — Plan of Care (Signed)
   Problem: Education: Goal: Knowledge of General Education information will improve Description: Including pain rating scale, medication(s)/side effects and non-pharmacologic comfort measures Outcome: Progressing   Problem: Clinical Measurements: Goal: Respiratory complications will improve Outcome: Progressing Goal: Cardiovascular complication will be avoided Outcome: Progressing

## 2024-05-08 NOTE — Progress Notes (Signed)
" ° ° °  Assessment & Plan: s/p Procedures: OPEN REDUCTION INTERNAL FIXATION (ORIF) CALCANEOUS FRACTURE (Right) PINNING, EXTREMITY, PERCUTANEOUS (Left) IRRIGATION AND DEBRIDEMENT FOOT (Right)  Jump from 3rd floor window at memory care 04/22/2008   Open R ankle fx, B calcaneus fx, L 2nd metatarsal fx - ortho c/s, Dr. Fidel, handoff to Dr. Kendal, ancef  given in TB, OR 12/12 Dr Kendal - ORIF Rt calcaneus, Closed treatment of left calcaneus fracture, right lateral malleolus fracture & Closed reduction and percutaneous fixation of left talonavicular joint subluxation, Percutaneous fixation of left navicular fracture - NWB b/l LE- discussed with NS and ortho - ok for 325 mg ASA BID on DC for VTE prophylaxis.   L SDH - NSGY c/s, Dr. Lanis, keppra  x7d for sz ppx (completed), repeat head CT stable.   Agitation - much improved, pleasant   FEN - Reg diet, bowel regimen. Stooling.   DVT - SCDs, started subcu heparin , ASA ID - No current abx Foley - None currently, spont void   Dispo - 6N, continue PT/OT, TOC working on SNF.   Patient is medically stable for discharge when a bed becomes available.        Krystal Spinner, MD [facility] practice Office: [phone]        Chief Complaint: Jump from window  Subjective: Patient active in bed, pleasant, confused with limited short term memory.  Objective: Vital signs in last 24 hours: Temp:  [97.8 F (36.6 C)-98.5 F (36.9 C)] 98.4 F (36.9 C) (12/27 0500) Pulse Rate:  [82-118] 82 (12/27 0500) Resp:  [16-18] 18 (12/27 0500) BP: (119-158)/(54-83) 131/60 (12/27 0500) SpO2:  [94 %-100 %] 94 % (12/27 0500) Last BM Date : 05/07/24  Intake/Output from previous day: No intake/output data recorded. Intake/Output this shift: No intake/output data recorded.  Physical Exam: HEENT - sclerae clear, mucous membranes moist Abdomen - soft without distension; non-tender Ext - dressings bilat ankles  Lab Results:   No results for input(s): WBC, HGB, HCT, PLT in the last 72 hours. BMET No results for input(s): NA, K, CL, CO2, GLUCOSE, BUN, CREATININE, CALCIUM in the last 72 hours. PT/INR No results for input(s): LABPROT, INR in the last 72 hours. Comprehensive Metabolic Panel:    Component Value Date/Time   NA 137 04/30/2024 1305   NA 138 04/27/2024 0152   K 4.1 04/30/2024 1305   K 3.9 04/27/2024 0152   CL 105 04/30/2024 1305   CL 102 04/27/2024 0152   CO2 22 04/30/2024 1305   CO2 25 04/27/2024 0152   BUN 15 04/30/2024 1305   BUN 14 04/27/2024 0152   CREATININE 0.74 04/30/2024 1305   CREATININE 0.76 04/27/2024 0152   GLUCOSE 100 (H) 04/30/2024 1305   GLUCOSE 115 (H) 04/27/2024 0152   CALCIUM 9.3 04/30/2024 1305   CALCIUM 8.7 (L) 04/27/2024 0152   AST 48 (H) 04/22/2024 1840   ALT 34 04/22/2024 1840   ALKPHOS 73 04/22/2024 1840   BILITOT 0.8 04/22/2024 1840   PROT 6.8 04/22/2024 1840   ALBUMIN 3.8 04/22/2024 1840    Studies/Results: No results found.    Krystal Spinner 05/08/2024  Patient ID: Shirley Woods, female   DOB: 1940/09/24, 83 y.o.   MRN: [id number]  "

## 2024-05-09 MED ADMIN — Haloperidol Lactate Inj 5 MG/ML: 5 mg | INTRAVENOUS | NDC 67457042600

## 2024-05-09 NOTE — Plan of Care (Signed)
  Problem: Pain Managment: Goal: General experience of comfort will improve and/or be controlled Outcome: Progressing   Problem: Safety: Goal: Ability to remain free from injury will improve Outcome: Progressing   Problem: Skin Integrity: Goal: Risk for impaired skin integrity will decrease Outcome: Progressing

## 2024-05-09 NOTE — Progress Notes (Signed)
" ° ° °  Assessment & Plan: s/p Procedures: OPEN REDUCTION INTERNAL FIXATION (ORIF) CALCANEOUS FRACTURE (Right) PINNING, EXTREMITY, PERCUTANEOUS (Left) IRRIGATION AND DEBRIDEMENT FOOT (Right)   Jump from 3rd floor window at memory care 04/22/2008   Open R ankle fx, B calcaneus fx, L 2nd metatarsal fx  - ortho c/s, Dr. Fidel, handoff to Dr. Kendal, ancef  given in TB, OR 12/12 Dr Kendal - ORIF Rt calcaneus, Closed treatment of left calcaneus fracture, right lateral malleolus fracture & Closed reduction and percutaneous fixation of left talonavicular joint subluxation, Percutaneous fixation of left navicular fracture - NWB b/l LE- discussed with NS and ortho - ok for 325 mg ASA BID on DC for VTE prophylaxis.    L SDH  - NSGY c/s, Dr. Lanis, keppra  x7d for sz ppx (completed), repeat head CT stable.   Agitation - resolved   FEN - Reg diet DVT - SCDs, started subcu heparin , ASA ID - None Foley - None   Dispo - 6N, continue PT/OT, TOC working on SNF.    Patient is medically stable for discharge when a bed becomes available.        Krystal Spinner, MD [facility] practice Office: [phone]        Chief Complaint: Foot and ankle fractures  Subjective: Patient in bed, resting comfortably.  No complaints.  Watching TV.  Objective: Vital signs in last 24 hours: Temp:  [97.5 F (36.4 C)-98.6 F (37 C)] 98.1 F (36.7 C) (12/28 0556) Pulse Rate:  [92-101] 92 (12/28 0556) Resp:  [16-18] 18 (12/28 0556) BP: (104-143)/(65-92) 135/91 (12/28 0556) SpO2:  [97 %-100 %] 97 % (12/28 0556) Last BM Date : 05/07/24  Intake/Output from previous day: 12/27 0701 - 12/28 0700 In: -  Out: 800 [Urine:800] Intake/Output this shift: No intake/output data recorded.  Physical Exam: HEENT - sclerae clear, mucous membranes moist Ext - dressings dry and intact  Lab Results:  No results for input(s): WBC, HGB, HCT, PLT in the last 72 hours. BMET No results  for input(s): NA, K, CL, CO2, GLUCOSE, BUN, CREATININE, CALCIUM in the last 72 hours. PT/INR No results for input(s): LABPROT, INR in the last 72 hours. Comprehensive Metabolic Panel:    Component Value Date/Time   NA 137 04/30/2024 1305   NA 138 04/27/2024 0152   K 4.1 04/30/2024 1305   K 3.9 04/27/2024 0152   CL 105 04/30/2024 1305   CL 102 04/27/2024 0152   CO2 22 04/30/2024 1305   CO2 25 04/27/2024 0152   BUN 15 04/30/2024 1305   BUN 14 04/27/2024 0152   CREATININE 0.74 04/30/2024 1305   CREATININE 0.76 04/27/2024 0152   GLUCOSE 100 (H) 04/30/2024 1305   GLUCOSE 115 (H) 04/27/2024 0152   CALCIUM 9.3 04/30/2024 1305   CALCIUM 8.7 (L) 04/27/2024 0152   AST 48 (H) 04/22/2024 1840   ALT 34 04/22/2024 1840   ALKPHOS 73 04/22/2024 1840   BILITOT 0.8 04/22/2024 1840   PROT 6.8 04/22/2024 1840   ALBUMIN 3.8 04/22/2024 1840    Studies/Results: No results found.    Krystal Spinner 05/09/2024  Patient ID: Shirley Woods, female   DOB: June 22, 1940, 83 y.o.   MRN: [id number]  "

## 2024-05-10 ENCOUNTER — Encounter (HOSPITAL_COMMUNITY): Payer: Self-pay

## 2024-05-10 NOTE — Progress Notes (Signed)
 Pt refused her dressing change, she stated  she just wanted to put lotion on it and which she did and then rewrapped them herself.

## 2024-05-10 NOTE — Progress Notes (Signed)
 "  Progress Note  17 Days Post-Op  Subjective: Patient pleasant and participates in conversation. Denies new concerns. Denies pain, n/v, SOB, CP.  ROS  All negative with the exception of above.  Objective: Vital signs in last 24 hours: Temp:  [97.6 F (36.4 C)-98.2 F (36.8 C)] 98.2 F (36.8 C) (12/29 0626) Pulse Rate:  [86-96] 86 (12/29 0626) Resp:  [16] 16 (12/29 0626) BP: (132-145)/(57-70) 145/70 (12/29 0626) SpO2:  [96 %-100 %] 96 % (12/29 0626) Last BM Date : 05/07/24  Intake/Output from previous day: 12/28 0701 - 12/29 0700 In: 240 [P.O.:240] Out: -  Intake/Output this shift: No intake/output data recorded.  PE: General: Pleasant female who is laying in bed in NAD. HEENT: Head is normocephalic, atraumatic.  Heart: HR normal during encounter. Palpable radial and pedal pulses bilaterally Lungs: Respiratory effort nonlabored. Abd: Soft, NT, ND. No rebound tenderness or guarding.  MS: Able to move all 4 extremities. Dressing around ankles bilaterally.  Skin: Warm and dry.  .    Lab Results:  No results for input(s): WBC, HGB, HCT, PLT in the last 72 hours. BMET No results for input(s): NA, K, CL, CO2, GLUCOSE, BUN, CREATININE, CALCIUM in the last 72 hours. PT/INR No results for input(s): LABPROT, INR in the last 72 hours. CMP     Component Value Date/Time   NA 137 04/30/2024 1305   K 4.1 04/30/2024 1305   CL 105 04/30/2024 1305   CO2 22 04/30/2024 1305   GLUCOSE 100 (H) 04/30/2024 1305   BUN 15 04/30/2024 1305   CREATININE 0.74 04/30/2024 1305   CALCIUM 9.3 04/30/2024 1305   PROT 6.8 04/22/2024 1840   ALBUMIN 3.8 04/22/2024 1840   AST 48 (H) 04/22/2024 1840   ALT 34 04/22/2024 1840   ALKPHOS 73 04/22/2024 1840   BILITOT 0.8 04/22/2024 1840   GFRNONAA >60 04/30/2024 1305   Lipase  No results found for: LIPASE     Studies/Results: No results found.  Anti-infectives: Anti-infectives (From admission, onward)     Start     Dose/Rate Route Frequency Ordered Stop   04/23/24 2000  ceFAZolin  (ANCEF ) IVPB 2g/100 mL premix        2 g 200 mL/hr over 30 Minutes Intravenous Every 8 hours 04/23/24 1509 04/24/24 1259   04/23/24 1259  vancomycin  (VANCOCIN ) powder  Status:  Discontinued          As needed 04/23/24 1259 04/23/24 1338   04/23/24 0915  ceFAZolin  (ANCEF ) IVPB 2g/100 mL premix        2 g 200 mL/hr over 30 Minutes Intravenous To ShortStay Surgical 04/23/24 0858 04/23/24 1304   04/23/24 0907  ceFAZolin  (ANCEF ) 2-4 GM/100ML-% IVPB       Note to Pharmacy: Jonda Yancy HERO: cabinet override      04/23/24 0907 04/23/24 1234   04/22/24 1845  ceFAZolin  (ANCEF ) IVPB 2g/100 mL premix        2 g 200 mL/hr over 30 Minutes Intravenous  Once 04/22/24 1836 04/22/24 1908        Assessment/Plan s/p Procedures: OPEN REDUCTION INTERNAL FIXATION (ORIF) CALCANEOUS FRACTURE (Right) PINNING, EXTREMITY, PERCUTANEOUS (Left) IRRIGATION AND DEBRIDEMENT FOOT (Right)   Jump from 3rd floor window at memory care    Open R ankle fx, B calcaneus fx, L 2nd metatarsal fx  - ortho c/s, Dr. Fidel, handoff to Dr. Kendal, ancef  given in TB, OR 12/12 Dr Kendal - ORIF Rt calcaneus, Closed treatment of left calcaneus fracture, right lateral malleolus fracture &  Closed reduction and percutaneous fixation of left talonavicular joint subluxation, Percutaneous fixation of left navicular fracture - NWB b/l LE- discussed with NS and ortho - ok for 325 mg ASA BID on DC for VTE prophylaxis.    L SDH  - NSGY c/s, Dr. Lanis, keppra  x7d for sz ppx (completed), repeat head CT stable.   Agitation - resolved   FEN - Reg diet DVT - SCDs, started subcu heparin , ASA ID - None Foley - None   Dispo - 6N, continue PT/OT, TOC working on SNF.    Patient is medically stable for discharge when a bed becomes available.   LOS: 18 days   I reviewed specialist notes, nursing notes, last 24 h vitals and pain scores, last 48 h intake and  output, last 24 h labs and trends, and last 24 h imaging results.  This care required moderate level of medical decision making.    Shirley Woods, [facility] Surgery 05/10/2024, 9:19 AM Please see Amion for pager number during day hours 7:00am-4:30pm  "

## 2024-05-10 NOTE — TOC Progression Note (Addendum)
 Transition of Care [facility]) - Progression Note    Patient Details  Name: Shirley Woods MRN: [id number] Date of Birth: 07-19-1940  [facility]) CM/SW Contact  Trampus Mcquerry E Cayleb Jarnigan, LCSW Phone Number: 05/10/2024, 9:22 AM  Clinical Narrative:    Checked with PA - per chart review and report, patient has been sitter free since 12/27, and behaviors have improved with med changes from last week. CSW called [facility], left VM requesting return call.   10:25- Brittany with [facility] states their Nursing is coming to assess patient today at bedside and will follow up after.   12:00- Call from Brice, [place] with [facility], answered her questions. She inquired why patient received IV Haldol  last night and would like to speak with bedside RN about patient for updates. Updated RN and Trauma PA.  1:00- LVM for Vernell with [facility] to follow up.  Call from daughter Maeola who has questions about patient's medications and would like to speak to a Provider about the plan for patient. Sherri states they are not able to arrange private care and their only plan at this point is for patient to return to [facility] when they can take her back.  1:30- Call from [employer] Clinical [employer]) and Brittany (Admissions) at [facility] who state that their Medical Director will not reconsider patient until Psych evaluates her Dementia medication regimen. Explained that Psych typically does not see patient's in the inpatient setting unless they are SI/HI and typically do not address Dementia treatment. Josh states they need Psych and possibly Neuro to see patient as they feel she is a danger to herself and others if she returns to the facility due to her behaviors. They state they would like her on the lowest appropriate dose of psychiatric medications to manage her behaviors where she is still stable, they state 100 mg of Seroquel  is not a typical dose and reiterate that patient cannot be on any IV or IM  medications. Sidra states he has just spoken with Maeola about this. CSW updated Providers.     Expected Discharge Plan: Skilled Nursing Facility Barriers to Discharge: Continued Medical Work up               Expected Discharge Plan and Services   Discharge Planning Services: CM Consult   Living arrangements for the past 2 months: Assisted Living Facility (Memory Care)                                       Social Drivers of Health (SDOH) Interventions SDOH Screenings   Food Insecurity: No Food Insecurity (04/23/2024)  Housing: Low Risk (04/23/2024)  Transportation Needs: No Transportation Needs (04/23/2024)  Utilities: Not At Risk (04/23/2024)  Social Connections: Patient Unable To Answer (04/23/2024)    Readmission Risk Interventions     No data to display

## 2024-05-10 NOTE — Plan of Care (Signed)
   Problem: Education: Goal: Knowledge of General Education information will improve Description: Including pain rating scale, medication(s)/side effects and non-pharmacologic comfort measures Outcome: Progressing   Problem: Activity: Goal: Risk for activity intolerance will decrease Outcome: Progressing   Problem: Nutrition: Goal: Adequate nutrition will be maintained Outcome: Progressing   Problem: Coping: Goal: Level of anxiety will decrease Outcome: Progressing

## 2024-05-11 NOTE — TOC Progression Note (Addendum)
 Transition of Care [facility]) - Progression Note    Patient Details  Name: Shirley Woods MRN: [id number] Date of Birth: 14-Oct-1940  [facility]) CM/SW Contact  Carmelita FORBES Carbon, LCSW Phone Number: 05/11/2024, 10:19 AM  Clinical Narrative:    CSW called Brittany at [facility], left VM informing her Psych assessed and requested follow up.  12:45- Call from [facility] DON at [name]. She states she wants to ensure that Psychiatry knows patient will initially be going to their SNF for STR which is not a locked memory care unit. CSW notified Dr. Larina who states this does not change the recommendations as she is currently tolerating her medications well.   1:30- Shirley Woods is requesting to speak to Dr. Larina. Notified Dr. Larina and provided contact info to [place] with his permission.   Expected Discharge Plan: Skilled Nursing Facility Barriers to Discharge: Continued Medical Work up               Expected Discharge Plan and Services   Discharge Planning Services: CM Consult   Living arrangements for the past 2 months: Assisted Living Facility (Memory Care)                                       Social Drivers of Health (SDOH) Interventions SDOH Screenings   Food Insecurity: No Food Insecurity (04/23/2024)  Housing: Low Risk (04/23/2024)  Transportation Needs: No Transportation Needs (04/23/2024)  Utilities: Not At Risk (04/23/2024)  Social Connections: Patient Unable To Answer (04/23/2024)    Readmission Risk Interventions     No data to display

## 2024-05-11 NOTE — Plan of Care (Signed)
" °  Problem: Clinical Measurements: Goal: Diagnostic test results will improve Outcome: Progressing Goal: Cardiovascular complication will be avoided Outcome: Progressing   Problem: Activity: Goal: Risk for activity intolerance will decrease Outcome: Not Progressing   Problem: Nutrition: Goal: Adequate nutrition will be maintained Outcome: Not Progressing   Problem: Coping: Goal: Level of anxiety will decrease Outcome: Not Progressing   Problem: Pain Managment: Goal: General experience of comfort will improve and/or be controlled Outcome: Progressing   "

## 2024-05-11 NOTE — Progress Notes (Signed)
 "  Progress Note  18 Days Post-Op  Subjective: Patient is pleasantly confused. Denies new concerns. Denies pain, n/v. Tolerating PO intake.  ROS  All negative with the exception of above.  Objective: Vital signs in last 24 hours: Temp:  [97.7 F (36.5 C)-97.9 F (36.6 C)] 97.9 F (36.6 C) (12/30 0600) Pulse Rate:  [83-104] 104 (12/30 0948) Resp:  [17] 17 (12/30 0948) BP: (121-160)/(72-95) 160/79 (12/30 0948) SpO2:  [97 %-100 %] 100 % (12/30 0948) Last BM Date : 05/07/24  Intake/Output from previous day: 12/29 0701 - 12/30 0700 In: -  Out: 200 [Urine:200] Intake/Output this shift: No intake/output data recorded.  PE: General: Pleasant female who is laying in bed in NAD. HEENT: Head is normocephalic, atraumatic.  Heart: HR normal during encounter. Palpable radial and pedal pulses bilaterally Lungs: Respiratory effort nonlabored. Abd: Soft, NT, ND. No rebound tenderness or guarding.  MS: Able to move all 4 extremities. Dressing around ankles bilaterally.  Skin: Warm and dry.    Lab Results:  No results for input(s): WBC, HGB, HCT, PLT in the last 72 hours. BMET No results for input(s): NA, K, CL, CO2, GLUCOSE, BUN, CREATININE, CALCIUM in the last 72 hours. PT/INR No results for input(s): LABPROT, INR in the last 72 hours. CMP     Component Value Date/Time   NA 137 04/30/2024 1305   K 4.1 04/30/2024 1305   CL 105 04/30/2024 1305   CO2 22 04/30/2024 1305   GLUCOSE 100 (H) 04/30/2024 1305   BUN 15 04/30/2024 1305   CREATININE 0.74 04/30/2024 1305   CALCIUM 9.3 04/30/2024 1305   PROT 6.8 04/22/2024 1840   ALBUMIN 3.8 04/22/2024 1840   AST 48 (H) 04/22/2024 1840   ALT 34 04/22/2024 1840   ALKPHOS 73 04/22/2024 1840   BILITOT 0.8 04/22/2024 1840   GFRNONAA >60 04/30/2024 1305   Lipase  No results found for: LIPASE     Studies/Results: No results found.  Anti-infectives: Anti-infectives (From admission, onward)    Start      Dose/Rate Route Frequency Ordered Stop   04/23/24 2000  ceFAZolin  (ANCEF ) IVPB 2g/100 mL premix        2 g 200 mL/hr over 30 Minutes Intravenous Every 8 hours 04/23/24 1509 04/24/24 1259   04/23/24 1259  vancomycin  (VANCOCIN ) powder  Status:  Discontinued          As needed 04/23/24 1259 04/23/24 1338   04/23/24 0915  ceFAZolin  (ANCEF ) IVPB 2g/100 mL premix        2 g 200 mL/hr over 30 Minutes Intravenous To [facility]Stay [facility] 04/23/24 0858 04/23/24 1304   04/23/24 0907  ceFAZolin  (ANCEF ) 2-4 GM/100ML-% IVPB       Note to Pharmacy: Jonda Yancy HERO: cabinet override      04/23/24 0907 04/23/24 1234   04/22/24 1845  ceFAZolin  (ANCEF ) IVPB 2g/100 mL premix        2 g 200 mL/hr over 30 Minutes Intravenous  Once 04/22/24 1836 04/22/24 1908        Assessment/Plans/p Procedures: Jump from 3rd floor window at memory care    Open R ankle fx, B calcaneus fx, L 2nd metatarsal fx  - ortho c/s, Dr. Fidel, handoff to Dr. Kendal, ancef  given in TB, OR 12/12 Dr Kendal - ORIF Rt calcaneus, Closed treatment of left calcaneus fracture, right lateral malleolus fracture & Closed reduction and percutaneous fixation of left talonavicular joint subluxation, Percutaneous fixation of left navicular fracture - NWB b/l LE- discussed with  NS and ortho - ok for 325 mg ASA BID on DC for VTE prophylaxis.    L SDH  - NSGY c/s, Dr. Lanis, keppra  x7d for sz ppx (completed), repeat head CT stable.   Agitation - resolved  --Psych has consulted and recommends continuation of seroquel  50 mg twice a day and 100 mg at night; Continuation of zoloft  100 mg per day and ativan  for anxiety as needed. No psychiatric contraindications to discharge.   FEN - Reg diet DVT - SCDs, started subcu heparin , ASA ID - None Foley - None   Dispo - 6N, continue PT/OT, TOC working on SNF.    LOS: 19 days   I reviewed consulting physician notes, specialist notes, nursing notes, last 24 h vitals and pain scores, last 48 h  intake and output, last 24 h labs and trends, and last 24 h imaging results.  This care required moderate level of medical decision making.    Marjorie Carlyon Favre, [facility] 05/11/2024, 11:53 AM Please see Amion for pager number during day hours 7:00am-4:30pm  "

## 2024-05-11 NOTE — Consult Note (Signed)
 [facility] Psychiatric Consult Initial  Patient Name: .Shirley Woods  MRN: [id number]  DOB: Sep 10, 1940  Consult Order details:  Orders (From admission, onward)     Start     Ordered   05/10/24 1502  IP CONSULT TO PSYCHIATRY       Ordering Provider: Maczis, Michael M, PA-C  Provider:  (Not yet assigned)  Question Answer Comment  Location [facility]   Reason for Consult? As per secure chat conversation      05/10/24 1501             Mode of Visit: In person    Psychiatry Consult Evaluation  Service Date: May 11, 2024 LOS:  LOS: 19 days  Chief Complaint the patient is a 83 year old Caucasian female who was seen today for an evaluation to assess psychotropics.  Primary Psychiatric Diagnoses  Major neurocognitive disorder with hallucinations 2.  Recent fracture with open reduction right ankle fracture and left calcaneal fracture. 3.  Rule out Alzheimer's dementia  Assessment  Shirley Woods is a 83 y.o. female admitted: Medicallyfor 04/22/2024  6:24 PM for bilateral fractures with open reduction. 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   The consult was primarily to evaluate the increase in Seroquel  from 50 mg which she was on prior to admission.  She is currently taking Seroquel  50 mg twice a day and 100 mg at night which was apparently increased on 05/02/2024.  In addition she is on Zoloft  100 mg/day.  According to the records patient had episodes of agitation prior to 05/02/2024.  However over the last 9 days patient has improved significantly and has required less and less of medication for agitation. The concern was that if she was to go to a memory care unit, she cannot be on IV medications and should not have significant hallucinations.  I have talked to the patient's daughter Shirley Woods at [phone], and based on my conversation it appears that patient  has done the best on the current doses of medication.  Despite the fact that this is a higher dose than what she was on, given the risks/benefits of medication, I would recommend continuation of the current dose and she can be tapered down once she is settled down at the memory care unit.  Hold if cleared of time Seroquel  can be tapered down The as needed Ativan  can also be given orally for increased anxiety. There is no contraindication for the patient to be going to a memory care unit.  Given her level of dementia and anxiety it is not unlikely for her to have increased anxiety and may be worsening paranoia with any changes in environment or situation.  An example is that she became very anxious after her husband left.  There is no indication for IV medications for agitation at this time.  Diagnoses:  Active Hospital problems: Principal Problem:   Open ankle fracture    Plan   ## Psychiatric Medication Recommendations:  -Continue on Seroquel  50 mg twice a day and 100 mg at night.  Continue to evaluate side effect profile and if patient shows excessive sedation this can be tapered as an outpatient to the lowest effective and tolerable dose - Continue Zoloft  100 mg a day -Continue as needed Ativan  by mouth for anxiety ## Medical Decision Making Capacity: Patient has a guardian and has thus been adjudicated incompetent; please involve patients guardian in medical decision  making  ## Further Work-up:  -- As per the hospitalist EKG -- most recent EKG on 05/02/2024 had QtC of 455 -- Pertinent labwork reviewed earlier this admission includes: Per the hospitalist see above   ## Disposition:-- There are no psychiatric contraindications to discharge at this time  ## Behavioral / Environmental: -Delirium Precautions: Delirium Interventions for Nursing and Staff: - RN to open blinds every AM. - To Bedside: Glasses, hearing aide, and pt's own shoes. Make available to patients. when possible and  encourage use. - Encourage po fluids when appropriate, keep fluids within reach. - OOB to chair with meals. - Passive ROM exercises to all extremities with AM & PM care. - RN to assess orientation to person, time and place QAM and PRN. - Recommend extended visitation hours with familiar family/friends as feasible. - Staff to minimize disturbances at night. Turn off television when pt asleep or when not in use.    ## Safety and Observation Level:  - Based on my clinical evaluation, I estimate the patient to be at low risk of self harm in the current setting. - At this time, we recommend  routine. This decision is based on my review of the chart including patient's history and current presentation, interview of the patient, mental status examination, and consideration of suicide risk including evaluating suicidal ideation, plan, intent, suicidal or self-harm behaviors, risk factors, and protective factors. This judgment is based on our ability to directly address suicide risk, implement suicide prevention strategies, and develop a safety plan while the patient is in the clinical setting. Please contact our team if there is a concern that risk level has changed.  CSSR Risk Category:C-SSRS RISK CATEGORY: No Risk  Suicide Risk Assessment: Patient has following modifiable risk factors for suicide: triggering events, which we are addressing by medication adjustment. Patient has following non-modifiable or demographic risk factors for suicide: None Patient has the following protective factors against suicide: Supportive family  Thank you for this consult request. Recommendations have been communicated to the primary team.  We will sign off at this time.   PAULETTE BEETS, MD       History of Present Illness  Relevant Aspects of [facility] Course:  Admitted on 04/22/2024 for open reduction and internal fixation of bilateral ankle and foot fractures. They are stabilizing..   Patient Report:  The  patient is a 82 year old female with a major neurocognitive disorder who is a poor historian.  Most of the information was obtained from the patient's daughter.  She reports that she had a progressively worsening memory problems which apparently triggered her to become progressively more paranoid.  When she started misperceiving her husband as her father.  She also had a high level of anxiety and she was placed on Zoloft  100 mg a day which she had not had any significant side effects.  Apparently she was on Seroquel  50 mg a day because of her paranoia.  She has no prior psych history according to the family.  Since admission patient had shown progressively more agitation requiring soft restraints but after Seroquel  was increased on 1221 2025 to 50 mg twice a day and 100 mg at night, her paranoia and agitation and psychosis is improved significantly.  Psych ROS:  Depression: Denies Anxiety: Admits to anxiety Mania (lifetime and current): Unknown Psychosis: (lifetime and current): Current paranoia and occasional psychosis due to her dementia.  Collateral information:  Contacted patient's daughter  at [phone] on 05/11/2024  Review of Systems  Psychiatric/Behavioral:  The patient is nervous/anxious.      Psychiatric and Social History  Psychiatric History:  Information collected from patient's daughter  Patient denies any previous psych history  Social History:  Patient is married has 3 children and is currently being evaluated for memory disorder clinic. Access to weapons/lethal means: None  Substance History None reported  Exam Findings  Physical Exam: As per the hospitalist Vital Signs:  Temp:  [97.7 F (36.5 C)-98 F (36.7 C)] 97.9 F (36.6 C) (12/30 0600) Pulse Rate:  [83-97] 83 (12/30 0600) Resp:  [16-17] 17 (12/30 0600) BP: (121-156)/(72-95) 121/84 (12/30 0600) SpO2:  [96 %-98 %] 98 % (12/30 0600) Blood pressure 121/84, pulse 83, temperature 97.9 F (36.6 C), resp.  rate 17, height 5' 2 (1.575 m), weight 68 kg, SpO2 98%. Body mass index is 27.44 kg/m.  Physical Exam Vitals and nursing note reviewed.  Constitutional:      Appearance: Normal appearance.  HENT:     Head: Normocephalic.  Neurological:     Mental Status: She is alert.  Psychiatric:        Mood and Affect: Mood normal.        Behavior: Behavior normal.     Mental Status Exam: General Appearance: Casual  Orientation:  Other:  Not oriented to time place or person  Memory:  Immediate;   Poor Recent;   Poor Remote;   Poor  Concentration:  Concentration: Fair and Attention Span: Fair  Recall:  Poor  Attention  Fair  Eye Contact:  Good  Speech:  Normal Rate  Language:  Fair  Volume:  Normal  Mood: Anxious  Affect:  Full Range  Thought Process:  Coherent  Thought Content:  Concrete  Suicidal Thoughts:  No  Homicidal Thoughts:  No  Judgement:  Poor  Insight:  Shallow  Psychomotor Activity:  Normal  Akathisia:  No  Fund of Knowledge:  Poor      Assets:  Communication Skills Desire for Improvement  Cognition:  Impaired,  Severe  ADL's:  Impaired  AIMS (if indicated):        Other History   These have been pulled in through the EMR, reviewed, and updated if appropriate.  Family History:  The patient's Family history is unknown by patient.  Medical History: Past Medical History:  Diagnosis Date   Brain aneurysm 2000   happened while vacationing in [place]   Dementia [facility])    Hypertension     Surgical History: Past Surgical History:  Procedure Laterality Date   ABDOMINAL HYSTERECTOMY     APPENDECTOMY     IRRIGATION AND DEBRIDEMENT FOOT Right 04/23/2024   Procedure: IRRIGATION AND DEBRIDEMENT FOOT;  Surgeon: Kendal Franky SQUIBB, MD;  Location: [facility] OR;  Service: Orthopedics;  Laterality: Right;   ORIF CALCANEOUS FRACTURE Right 04/23/2024   Procedure: OPEN REDUCTION INTERNAL FIXATION (ORIF) CALCANEOUS FRACTURE;  Surgeon: Kendal Franky SQUIBB, MD;  Location: [facility] OR;   Service: Orthopedics;  Laterality: Right;   PERCUTANEOUS PINNING Left 04/23/2024   Procedure: PINNING, EXTREMITY, PERCUTANEOUS;  Surgeon: Kendal Franky SQUIBB, MD;  Location: [facility] OR;  Service: Orthopedics;  Laterality: Left;     Medications:  Current Medications[1]  Allergies: Allergies[2]  PAULETTE BEETS, MD     [1]  Current Facility-Administered Medications:    acetaminophen  (TYLENOL ) tablet 1,000 mg, 1,000 mg, Oral, Q6H, Tanda Locus, MD, 1,000 mg at 05/10/24 1627   amLODipine  (NORVASC ) tablet 5 mg, 5 mg, Oral, Daily, Tanda Locus, MD, 5 mg at 05/10/24 [id number]  aspirin  tablet 325 mg, 325 mg, Oral, BID, Johnson, Kelly R, PA-C, 325 mg at 05/10/24 2202   cholecalciferol  (VITAMIN D3) 25 MCG (1000 UNIT) tablet 2,000 Units, 2,000 Units, Oral, Daily, Danton Lauraine LABOR, PA-C, 2,000 Units at 05/10/24 9047   docusate sodium  (COLACE) capsule 100 mg, 100 mg, Oral, BID, Tanda Locus, MD, 100 mg at 05/10/24 2203   donepezil  (ARICEPT ) tablet 5 mg, 5 mg, Oral, QPM, Tanda Locus, MD, 5 mg at 05/10/24 1627   feeding supplement (ENSURE PLUS HIGH PROTEIN) liquid 237 mL, 237 mL, Oral, BID BM, Tanda Locus, MD, 237 mL at 05/10/24 9046   heparin  injection 5,000 Units, 5,000 Units, Subcutaneous, Q8H, Tanda Locus, MD, 5,000 Units at 05/10/24 2201   hydrALAZINE  (APRESOLINE ) injection 10 mg, 10 mg, Intravenous, Q2H PRN, Tanda Locus, MD   lamoTRIgine  (LAMICTAL ) tablet 75 mg, 75 mg, Oral, QHS, Tanda Locus, MD, 75 mg at 05/10/24 2202   lidocaine  (LIDODERM ) 5 % 1 patch, 1 patch, Transdermal, Q24H, Vicci Burnard SAUNDERS, PA-C, 1 patch at 05/10/24 9047   LORazepam  (ATIVAN ) tablet 0.5 mg, 0.5 mg, Oral, Q6H PRN, Dasie Leonor CROME, MD, 0.5 mg at 05/10/24 2203   metoprolol  tartrate (LOPRESSOR ) injection 5 mg, 5 mg, Intravenous, Q6H PRN, Tanda Locus, MD, 5 mg at 04/28/24 0407   morphine  (PF) 2 MG/ML injection 1-2 mg, 1-2 mg, Intravenous, Q4H PRN, Tanda Locus, MD, 2 mg at 05/07/24 2136   ondansetron  (ZOFRAN -ODT) disintegrating  tablet 4 mg, 4 mg, Oral, Q6H PRN **OR** ondansetron  (ZOFRAN ) injection 4 mg, 4 mg, Intravenous, Q6H PRN, Tanda Locus, MD, 4 mg at 04/22/24 1933   Oral care mouth rinse, 15 mL, Mouth Rinse, PRN, Tanda Locus, MD   oxyCODONE  (Oxy IR/ROXICODONE ) immediate release tablet 2.5-5 mg, 2.5-5 mg, Oral, Q6H PRN, Maczis, Michael M, PA-C, 5 mg at 05/09/24 1605   polyethylene glycol (MIRALAX  / GLYCOLAX ) packet 17 g, 17 g, Oral, Daily PRN, Tanda Locus, MD, 17 g at 04/25/24 1338   polyethylene glycol (MIRALAX  / GLYCOLAX ) packet 17 g, 17 g, Oral, Daily, Byerly, Faera, MD, 17 g at 05/10/24 9046   QUEtiapine  (SEROQUEL ) tablet 100 mg, 100 mg, Oral, QHS, Maczis, Michael M, PA-C, 100 mg at 05/10/24 2202   QUEtiapine  (SEROQUEL ) tablet 50 mg, 50 mg, Oral, BID, Simaan, Elizabeth S, PA-C, 50 mg at 05/10/24 1627   senna (SENOKOT) tablet 8.6 mg, 1 tablet, Oral, QHS, Sebastian Moles, MD, 8.6 mg at 05/10/24 2203   sertraline  (ZOLOFT ) tablet 100 mg, 100 mg, Oral, QPM, Tanda Locus, MD, 100 mg at 05/10/24 1627   tamsulosin  (FLOMAX ) capsule 0.4 mg, 0.4 mg, Oral, Daily, Tanda Locus, MD, 0.4 mg at 05/10/24 0952 [2] No Known Allergies

## 2024-05-11 NOTE — Plan of Care (Signed)
   Problem: Education: Goal: Knowledge of General Education information will improve Description: Including pain rating scale, medication(s)/side effects and non-pharmacologic comfort measures Outcome: Progressing   Problem: Activity: Goal: Risk for activity intolerance will decrease Outcome: Progressing   Problem: Nutrition: Goal: Adequate nutrition will be maintained Outcome: Progressing   Problem: Coping: Goal: Level of anxiety will decrease Outcome: Progressing

## 2024-05-11 NOTE — Progress Notes (Signed)
 Occupational Therapy Treatment Patient Details Name: [facility] MRN: [id number] DOB: 01/25/1941 Today's Date: 05/11/2024   History of present illness [facility] is an 83 y.o. female who fell out of a 3rd story window trying to escape from memory care unit at [facility]. Upon arrival found to have Bil LE fxs and now is s/p ORIF of right calcaneus and right subtalar joint dislocation; closed reduction of left calcaneus fx, right lateral malleolus fx; closed reduction and percutaneous fixation of left talonavicular joint subluxation; and percutaneous fixation of left navicular fx. 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 PHMx: dementia, prior ABI >20 years ago.   OT comments  Pt resting in bed, husband present, Pt pleasantly confused. Pt eager to participate. Pt not aware of precautions or deficits, not fully aware of situation, not aware of month/year. Pt has good BUE strength, able to laterally scoot into recliner with CGA overall, min A for safety and constant cues for maintaining NWB precautions to BLEs. Pt limited due to poor safety awareness and NWB precautions, there is little to no carryover of learned skills due to STM deficits, will likely quickly improve once able to put weight on BLEs, has good strength/ROM overall. Will continue to see acutely to maintain strength and encourage OOB activities, DC plan to postacute rehab <3hrs/day still appropriate.       If plan is discharge home, recommend the following:  A lot of help with walking and/or transfers;A lot of help with bathing/dressing/bathroom;Help with stairs or ramp for entrance;Assist for transportation;Direct supervision/assist for financial management;Direct supervision/assist for medications management   Equipment Recommendations  Other (comment) (defer)    Recommendations for Other Services      Precautions / Restrictions Precautions Precautions: Fall Recall of  Precautions/Restrictions: Impaired Precaution/Restrictions Comments: CAM boots when OOB, sundowning Restrictions Weight Bearing Restrictions Per Provider Order: Yes RLE Weight Bearing Per Provider Order: Non weight bearing LLE Weight Bearing Per Provider Order: Non weight bearing Other Position/Activity Restrictions: Per Camie Moores, PA-C on 12/13 via secure chat. That is fine to allow her to have her feet on the ground in the CAM boots to assist with lateral scoot but do not want her trying to stand and transfer.       Mobility Bed Mobility Overal bed mobility: Needs Assistance Bed Mobility: Supine to Sit     Supine to sit: Supervision     General bed mobility comments: supervision for maintaining precautions and safety    Transfers Overall transfer level: Needs assistance Equipment used: None Transfers: Bed to chair/wheelchair/BSC            Lateral/Scoot Transfers: Min assist General transfer comment: min A, good BUE strength, constant cues to maintain safety and NWB precautions due to mem loss     Balance Overall balance assessment: Needs assistance Sitting-balance support: No upper extremity supported, Feet supported Sitting balance-Leahy Scale: Good Sitting balance - Comments: impulsive, cueing for safety and maintaining NWB precautions       Standing balance comment: unable to stand due to NWB BLE orders                           ADL either performed or assessed with clinical judgement   ADL Overall ADL's : Needs assistance/impaired Eating/Feeding: Set up;Sitting   Grooming: Set up;Sitting           Upper Body Dressing : Set up;Sitting  Toilet Transfer: Requires drop arm;Minimal assistance;BSC/3in1             General ADL Comments: Pt does well with lateral scoot, strong BUEs. Pt needs constant cues to maintain NWB to BLEs.    Extremity/Trunk Assessment Upper Extremity Assessment Upper Extremity Assessment: Overall WFL  for tasks assessed   Lower Extremity Assessment Lower Extremity Assessment: Defer to PT evaluation        Vision       Perception     Praxis     Communication Communication Communication: Impaired Factors Affecting Communication: Hearing impaired   Cognition Arousal: Alert Behavior During Therapy: Restless, WFL for tasks assessed/performed Cognition: History of cognitive impairments             OT - Cognition Comments: A/O to self, location, and partial situation. Not aware of deficits, not aware of precautions to BLEs. Pt able to follow commands and participate as needed.                 Following commands: Intact        Cueing   Cueing Techniques: Verbal cues, Gestural cues, Tactile cues, Visual cues  Exercises      Shoulder Instructions       General Comments      Pertinent Vitals/ Pain       Pain Assessment Pain Assessment: No/denies pain  Home Living                                          Prior Functioning/Environment              Frequency  Min 1X/week        Progress Toward Goals  OT Goals(current goals can now be found in the care plan section)  Progress towards OT goals: Progressing toward goals     Plan      Co-evaluation                 AM-PAC OT 6 Clicks Daily Activity     Outcome Measure   Help from another person eating meals?: A Little Help from another person taking care of personal grooming?: A Little Help from another person toileting, which includes using toliet, bedpan, or urinal?: A Little Help from another person bathing (including washing, rinsing, drying)?: A Lot Help from another person to put on and taking off regular upper body clothing?: A Little Help from another person to put on and taking off regular lower body clothing?: A Lot 6 Click Score: 16    End of Session Equipment Utilized During Treatment: Gait belt  OT Visit Diagnosis: Other abnormalities of gait and  mobility (R26.89);Other symptoms and signs involving cognitive function;Pain Pain - part of body: Ankle and joints of foot   Activity Tolerance Patient tolerated treatment well   Patient Left in chair;with call bell/phone within reach;with chair alarm set;with family/visitor present   Nurse Communication Mobility status        Time: 8851-8792 OT Time Calculation (min): 19 min  Charges: OT General Charges $OT Visit: 1 Visit OT Treatments $Therapeutic Activity: 8-22 mins  [address] OTR/L   Shirley Woods 05/11/2024, 12:21 PM

## 2024-05-12 NOTE — Consult Note (Signed)
 [facility] Psychiatric Consult Initial  Patient Name: .Shirley Woods  MRN: [id number]  DOB: 06-14-1940  Consult Order details:  Orders (From admission, onward)     Start     Ordered   05/10/24 1502  IP CONSULT TO PSYCHIATRY       Ordering Provider: Maczis, Michael M, PA-C  Provider:  (Not yet assigned)  Question Answer Comment  Location [facility]   Reason for Consult? As per secure chat conversation      05/10/24 1501             Mode of Visit: In person    Psychiatry Consult Evaluation  Service Date: May 12, 2024 LOS:  LOS: 20 days  Chief Complaint the patient is a 83 year old Caucasian female who was seen today for an evaluation to assess psychotropics.  Primary Psychiatric Diagnoses  Major neurocognitive disorder with hallucinations 2.  Recent fracture with open reduction right ankle fracture and left calcaneal fracture. 3.  Rule out Alzheimer's dementia  Assessment  CONSEPCION Woods is a 83 y.o. female admitted: Medicallyfor 04/22/2024  6:24 PM for bilateral fractures with open reduction. 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   The consult was primarily to evaluate the increase in Seroquel  from 50 mg which she was on prior to admission.  She is currently taking Seroquel  50 mg twice a day and 100 mg at night which was apparently increased on 05/02/2024.  In addition she is on Zoloft  100 mg/day.  According to the records patient had episodes of agitation prior to 05/02/2024.  However over the last 9 days patient has improved significantly and has required less and less of medication for agitation. The concern was that if she was to go to a memory care unit, she cannot be on IV medications and should not have significant hallucinations.  I have talked to the patient's daughter Caleesi Kohl at [phone], and based on my conversation it appears that patient  has done the best on the current doses of medication.  Despite the fact that this is a higher dose than what she was on, given the risks/benefits of medication, I would recommend continuation of the current dose and she can be tapered down once she is settled down at the memory care unit.  Hold if cleared of time Seroquel  can be tapered down The as needed Ativan  can also be given orally for increased anxiety. There is no contraindication for the patient to be going to a memory care unit.  Given her level of dementia and anxiety it is not unlikely for her to have increased anxiety and may be worsening paranoia with any changes in environment or situation.  An example is that she became very anxious after her husband left.  There is no indication for IV medications for agitation at this time. 05/12/2024: The skilled nursing facility contacted this writer asking multiple questions about the patient's behavior.  Their concern was that patient was not going to a locked memory care unit initially but will be going to the SNF first.  They are concerned about any adjustment of medications based on this.  However they were informed that medications cannot be adjusted in anticipation of her doing something when she is stable at this time.  Also it should be noted that patient tends to get agitated around the time when her husband leaves her to go back home.  This has  happened consistently the last 2 days.  Once she settles down she seems to be much more pleasant and cooperative although she remains at her baseline confusion.  They were encouraged to adjust her medications according to her behavior once she is in their facility with the help of the outpatient psychiatrist/physician.  While in the hospital she is currently stable except for receiving Ativan  when her husband leaves her to go back home.  It is likely patient may need titration or addition of medications all depending on how she adjusts to her routine in  the facility. Diagnoses:  Active Hospital problems: Principal Problem:   Open ankle fracture    Plan   ## Psychiatric Medication Recommendations:  -Continue on Seroquel  50 mg twice a day and 100 mg at night.  Continue to evaluate side effect profile and if patient shows excessive sedation this can be tapered as an outpatient to the lowest effective and tolerable dose - Continue Zoloft  100 mg a day -Continue as needed Ativan  by mouth for anxiety ## Medical Decision Making Capacity: Patient has a guardian and has thus been adjudicated incompetent; please involve patients guardian in medical decision making  ## Further Work-up:  -- As per the hospitalist EKG -- most recent EKG on 05/02/2024 had QtC of 455 -- Pertinent labwork reviewed earlier this admission includes: Per the hospitalist see above   ## Disposition:-- There are no psychiatric contraindications to discharge at this time  ## Behavioral / Environmental: -Delirium Precautions: Delirium Interventions for Nursing and Staff: - RN to open blinds every AM. - To Bedside: Glasses, hearing aide, and pt's own shoes. Make available to patients. when possible and encourage use. - Encourage po fluids when appropriate, keep fluids within reach. - OOB to chair with meals. - Passive ROM exercises to all extremities with AM & PM care. - RN to assess orientation to person, time and place QAM and PRN. - Recommend extended visitation hours with familiar family/friends as feasible. - Staff to minimize disturbances at night. Turn off television when pt asleep or when not in use.    ## Safety and Observation Level:  - Based on my clinical evaluation, I estimate the patient to be at low risk of self harm in the current setting. - At this time, we recommend  routine. This decision is based on my review of the chart including patient's history and current presentation, interview of the patient, mental status examination, and consideration of suicide risk  including evaluating suicidal ideation, plan, intent, suicidal or self-harm behaviors, risk factors, and protective factors. This judgment is based on our ability to directly address suicide risk, implement suicide prevention strategies, and develop a safety plan while the patient is in the clinical setting. Please contact our team if there is a concern that risk level has changed.  CSSR Risk Category:C-SSRS RISK CATEGORY: No Risk  Suicide Risk Assessment: Patient has following modifiable risk factors for suicide: triggering events, which we are addressing by medication adjustment. Patient has following non-modifiable or demographic risk factors for suicide: None Patient has the following protective factors against suicide: Supportive family  Thank you for this consult request. Recommendations have been communicated to the primary team.  We will sign off at this time.   PAULETTE BEETS, MD       History of Present Illness  Relevant Aspects of [facility] Course:  Admitted on 04/22/2024 for open reduction and internal fixation of bilateral ankle and foot fractures. They are stabilizing..   Patient Report:  The patient is a 83 year old female with a major neurocognitive disorder who is a poor historian.  Most of the information was obtained from the patient's daughter.  She reports that she had a progressively worsening memory problems which apparently triggered her to become progressively more paranoid.  When she started misperceiving her husband as her father.  She also had a high level of anxiety and she was placed on Zoloft  100 mg a day which she had not had any significant side effects.  Apparently she was on Seroquel  50 mg a day because of her paranoia.  She has no prior psych history according to the family.  Since admission patient had shown progressively more agitation requiring soft restraints but after Seroquel  was increased on 1221 2025 to 50 mg twice a day and 100 mg at night, her  paranoia and agitation and psychosis is improved significantly.  Psych ROS:  Depression: Denies Anxiety: Admits to anxiety Mania (lifetime and current): Unknown Psychosis: (lifetime and current): Current paranoia and occasional psychosis due to her dementia.  Collateral information:  Contacted patient's daughter  at [phone] on 05/11/2024  Review of Systems  Psychiatric/Behavioral:  The patient is nervous/anxious.      Psychiatric and Social History  Psychiatric History:  Information collected from patient's daughter  Patient denies any previous psych history  Social History:  Patient is married has 3 children and is currently being evaluated for memory disorder clinic. Access to weapons/lethal means: None  Substance History None reported  Exam Findings  Physical Exam: As per the hospitalist Vital Signs:  Temp:  [97.5 F (36.4 C)-97.6 F (36.4 C)] 97.5 F (36.4 C) (12/30 2002) Pulse Rate:  [90-98] 90 (12/30 2002) Resp:  [17] 17 (12/30 2002) BP: (128-130)/(63-65) 128/65 (12/30 2002) SpO2:  [98 %-100 %] 100 % (12/30 2002) Blood pressure 128/65, pulse 90, temperature (!) 97.5 F (36.4 C), temperature source Oral, resp. rate 17, height 5' 2 (1.575 m), weight 68 kg, SpO2 100%. Body mass index is 27.44 kg/m.  Physical Exam Vitals and nursing note reviewed.  Constitutional:      Appearance: Normal appearance.  HENT:     Head: Normocephalic.  Neurological:     Mental Status: She is alert.  Psychiatric:        Mood and Affect: Mood normal.        Behavior: Behavior normal.     Mental Status Exam: General Appearance: Casual  Orientation:  Other:  Not oriented to time place or person  Memory:  Immediate;   Poor Recent;   Poor Remote;   Poor  Concentration:  Concentration: Fair and Attention Span: Fair  Recall:  Poor  Attention  Fair  Eye Contact:  Good  Speech:  Normal Rate  Language:  Fair  Volume:  Normal  Mood: Anxious  Affect:  Full Range  Thought  Process:  Coherent  Thought Content:  Concrete  Suicidal Thoughts:  No  Homicidal Thoughts:  No  Judgement:  Poor  Insight:  Shallow  Psychomotor Activity:  Normal  Akathisia:  No  Fund of Knowledge:  Poor      Assets:  Communication Skills Desire for Improvement  Cognition:  Impaired,  Severe  ADL's:  Impaired  AIMS (if indicated):        Other History   These have been pulled in through the EMR, reviewed, and updated if appropriate.  Family History:  The patient's Family history is unknown by patient.  Medical History: Past Medical History:  Diagnosis Date  Brain aneurysm 2000   happened while vacationing in [place]   Dementia [facility])    Hypertension     Surgical History: Past Surgical History:  Procedure Laterality Date   ABDOMINAL HYSTERECTOMY     APPENDECTOMY     IRRIGATION AND DEBRIDEMENT FOOT Right 04/23/2024   Procedure: IRRIGATION AND DEBRIDEMENT FOOT;  Surgeon: Kendal Franky SQUIBB, MD;  Location: [facility] OR;  Service: Orthopedics;  Laterality: Right;   ORIF CALCANEOUS FRACTURE Right 04/23/2024   Procedure: OPEN REDUCTION INTERNAL FIXATION (ORIF) CALCANEOUS FRACTURE;  Surgeon: Kendal Franky SQUIBB, MD;  Location: [facility] OR;  Service: Orthopedics;  Laterality: Right;   PERCUTANEOUS PINNING Left 04/23/2024   Procedure: PINNING, EXTREMITY, PERCUTANEOUS;  Surgeon: Kendal Franky SQUIBB, MD;  Location: [facility] OR;  Service: Orthopedics;  Laterality: Left;     Medications:  Current Medications[1]  Allergies: Allergies[2]  PAULETTE BEETS, MD      [1]  Current Facility-Administered Medications:    acetaminophen  (TYLENOL ) tablet 1,000 mg, 1,000 mg, Oral, Q6H, Tanda Locus, MD, 1,000 mg at 05/12/24 9396   amLODipine  (NORVASC ) tablet 5 mg, 5 mg, Oral, Daily, Tanda Locus, MD, 5 mg at 05/11/24 1039   aspirin  tablet 325 mg, 325 mg, Oral, BID, Johnson, Kelly R, PA-C, 325 mg at 05/11/24 2000   cholecalciferol  (VITAMIN D3) 25 MCG (1000 UNIT) tablet 2,000 Units, 2,000 Units, Oral, Daily,  Danton Lauraine LABOR, PA-C, 2,000 Units at 05/11/24 1039   docusate sodium  (COLACE) capsule 100 mg, 100 mg, Oral, BID, Tanda Locus, MD, 100 mg at 05/11/24 2000   donepezil  (ARICEPT ) tablet 5 mg, 5 mg, Oral, QPM, Tanda Locus, MD, 5 mg at 05/11/24 1756   feeding supplement (ENSURE PLUS HIGH PROTEIN) liquid 237 mL, 237 mL, Oral, BID BM, Tanda Locus, MD, 237 mL at 05/11/24 1353   heparin  injection 5,000 Units, 5,000 Units, Subcutaneous, Q8H, Tanda Locus, MD, 5,000 Units at 05/12/24 9396   hydrALAZINE  (APRESOLINE ) injection 10 mg, 10 mg, Intravenous, Q2H PRN, Tanda Locus, MD   lamoTRIgine  (LAMICTAL ) tablet 75 mg, 75 mg, Oral, QHS, Tanda Locus, MD, 75 mg at 05/11/24 2000   lidocaine  (LIDODERM ) 5 % 1 patch, 1 patch, Transdermal, Q24H, Vicci Burnard SAUNDERS, PA-C, 1 patch at 05/11/24 1039   LORazepam  (ATIVAN ) tablet 0.5 mg, 0.5 mg, Oral, Q6H PRN, Dasie Leonor CROME, MD, 0.5 mg at 05/11/24 2143   metoprolol  tartrate (LOPRESSOR ) injection 5 mg, 5 mg, Intravenous, Q6H PRN, Tanda Locus, MD, 5 mg at 04/28/24 0407   morphine  (PF) 2 MG/ML injection 1-2 mg, 1-2 mg, Intravenous, Q4H PRN, Tanda Locus, MD, 2 mg at 05/07/24 2136   ondansetron  (ZOFRAN -ODT) disintegrating tablet 4 mg, 4 mg, Oral, Q6H PRN **OR** ondansetron  (ZOFRAN ) injection 4 mg, 4 mg, Intravenous, Q6H PRN, Tanda Locus, MD, 4 mg at 04/22/24 1933   Oral care mouth rinse, 15 mL, Mouth Rinse, PRN, Tanda Locus, MD   oxyCODONE  (Oxy IR/ROXICODONE ) immediate release tablet 2.5-5 mg, 2.5-5 mg, Oral, Q6H PRN, Maczis, Michael M, PA-C, 5 mg at 05/11/24 2000   polyethylene glycol (MIRALAX  / GLYCOLAX ) packet 17 g, 17 g, Oral, Daily PRN, Tanda Locus, MD, 17 g at 04/25/24 1338   polyethylene glycol (MIRALAX  / GLYCOLAX ) packet 17 g, 17 g, Oral, Daily, Byerly, Faera, MD, 17 g at 05/11/24 1039   QUEtiapine  (SEROQUEL ) tablet 100 mg, 100 mg, Oral, QHS, Maczis, Michael M, PA-C, 100 mg at 05/11/24 2000   QUEtiapine  (SEROQUEL ) tablet 50 mg, 50 mg, Oral, BID, Simaan, Elizabeth  S, PA-C, 50 mg at 05/11/24 1622  senna (SENOKOT) tablet 8.6 mg, 1 tablet, Oral, QHS, Sebastian Moles, MD, 8.6 mg at 05/11/24 2000   sertraline  (ZOLOFT ) tablet 100 mg, 100 mg, Oral, QPM, Tanda Locus, MD, 100 mg at 05/11/24 1755   tamsulosin  (FLOMAX ) capsule 0.4 mg, 0.4 mg, Oral, Daily, Tanda Locus, MD, 0.4 mg at 05/11/24 1039 [2] No Known Allergies

## 2024-05-12 NOTE — Progress Notes (Signed)
 19 Days Post-Op   Subjective/Chief Complaint: No new complaints Not agitated Tolerating PO's + BM   Objective: Vital signs in last 24 hours: Temp:  [97.5 F (36.4 C)-97.6 F (36.4 C)] 97.5 F (36.4 C) (12/30 2002) Pulse Rate:  [90-104] 90 (12/30 2002) Resp:  [17] 17 (12/30 2002) BP: (128-160)/(63-79) 128/65 (12/30 2002) SpO2:  [98 %-100 %] 100 % (12/30 2002) Last BM Date : 05/11/24  Intake/Output from previous day: 12/30 0701 - 12/31 0700 In: 660 [P.O.:660] Out: -  Intake/Output this shift: No intake/output data recorded.  General: Pleasant female who is laying in bed in NAD. HEENT: Head is normocephalic, atraumatic.  Heart: HR normal during encounter. Palpable radial and pedal pulses bilaterally Lungs: Respiratory effort nonlabored. Abd: Soft, NT, ND. No rebound tenderness or guarding.  MS: Able to move all 4 extremities. Dressing around ankles bilaterally.  Skin: Warm and dry.      Anti-infectives: Anti-infectives (From admission, onward)    Start     Dose/Rate Route Frequency Ordered Stop   04/23/24 2000  ceFAZolin  (ANCEF ) IVPB 2g/100 mL premix        2 g 200 mL/hr over 30 Minutes Intravenous Every 8 hours 04/23/24 1509 04/24/24 1259   04/23/24 1259  vancomycin  (VANCOCIN ) powder  Status:  Discontinued          As needed 04/23/24 1259 04/23/24 1338   04/23/24 0915  ceFAZolin  (ANCEF ) IVPB 2g/100 mL premix        2 g 200 mL/hr over 30 Minutes Intravenous To [facility]Stay [facility] 04/23/24 0858 04/23/24 1304   04/23/24 0907  ceFAZolin  (ANCEF ) 2-4 GM/100ML-% IVPB       Note to Pharmacy: Jonda Yancy HERO: cabinet override      04/23/24 0907 04/23/24 1234   04/22/24 1845  ceFAZolin  (ANCEF ) IVPB 2g/100 mL premix        2 g 200 mL/hr over 30 Minutes Intravenous  Once 04/22/24 1836 04/22/24 1908       Assessment/Plan: Jump from 3rd floor window at memory care    Open R ankle fx, B calcaneus fx, L 2nd metatarsal fx  - ortho c/s, Dr. Fidel, handoff to Dr. Kendal,  ancef  given in TB, OR 12/12 Dr Kendal - ORIF Rt calcaneus, Closed treatment of left calcaneus fracture, right lateral malleolus fracture & Closed reduction and percutaneous fixation of left talonavicular joint subluxation, Percutaneous fixation of left navicular fracture - NWB b/l LE- discussed with NS and ortho - ok for 325 mg ASA BID on DC for VTE prophylaxis.    L SDH  - NSGY c/s, Dr. Lanis, keppra  x7d for sz ppx (completed), repeat head CT stable.   Agitation - resolved   --Psych has consulted and recommends continuation of seroquel  50 mg twice a day and 100 mg at night; Continuation of zoloft  100 mg per day and ativan  for anxiety as needed. No psychiatric contraindications to discharge.   FEN - Reg diet DVT - SCDs, started subcu heparin , ASA ID - None Foley - None   Dispo - 6N, continue PT/OT, TOC working on SNF.    LOS: 20 days    Shirley Woods 05/12/2024

## 2024-05-12 NOTE — TOC Progression Note (Addendum)
 Transition of Care [facility]) - Progression Note    Patient Details  Name: Shirley Woods MRN: [id number] Date of Birth: October 23, 1940  Transition of Care [facility] CM/SW Contact  Orlean Holtrop E Darria Corvera, LCSW Phone Number: 05/12/2024, 9:07 AM  Clinical Narrative:    9:05- CSW called [facility], she is following up with their team from the outcome of the discussion with Psych and will call CSW back.   3:25- Call from [employer], Production Designer, Theatre/television/film at [place]. He states they are updating the family now, but [facility] can tentatively plan to take patient back on Monday 1/5 as long as they are able to get a 24 hour sitter in place by then. This is assuming that patient's behaviors remain controlled over the weekend and she does not require any IV/IM meds, sitter, or restraints. CSW updated the Care Team.   Expected Discharge Plan: Skilled Nursing Facility Barriers to Discharge: Continued Medical Work up               Expected Discharge Plan and Services   Discharge Planning Services: CM Consult   Living arrangements for the past 2 months: Assisted Living Facility (Memory Care)                                       Social Drivers of Health (SDOH) Interventions SDOH Screenings   Food Insecurity: No Food Insecurity (04/23/2024)  Housing: Low Risk (04/23/2024)  Transportation Needs: No Transportation Needs (04/23/2024)  Utilities: Not At Risk (04/23/2024)  Social Connections: Patient Unable To Answer (04/23/2024)    Readmission Risk Interventions     No data to display

## 2024-05-12 NOTE — Plan of Care (Signed)
  Problem: Activity: Goal: Risk for activity intolerance will decrease Outcome: Not Progressing   Problem: Coping: Goal: Level of anxiety will decrease Outcome: Not Progressing   Problem: Pain Managment: Goal: General experience of comfort will improve and/or be controlled Outcome: Not Progressing   Problem: Safety: Goal: Ability to remain free from injury will improve Outcome: Not Progressing

## 2024-05-12 NOTE — Plan of Care (Signed)
" °  Problem: Clinical Measurements: Goal: Ability to maintain clinical measurements within normal limits will improve Outcome: Progressing Goal: Respiratory complications will improve Outcome: Progressing   Problem: Nutrition: Goal: Adequate nutrition will be maintained Outcome: Progressing   Problem: Coping: Goal: Level of anxiety will decrease Outcome: Not Progressing   Problem: Pain Managment: Goal: General experience of comfort will improve and/or be controlled Outcome: Progressing   "

## 2024-05-12 NOTE — Progress Notes (Signed)
 Physical Therapy Treatment Patient Details Name: SEATTLE DALPORTO MRN: [id number] DOB: Apr 17, 1941 Today's Date: 05/12/2024   History of Present Illness Debroah Shuttleworth Hearst is an 83 y.o. female who fell out of a 3rd story window trying to escape from memory care unit at [facility]. Upon arrival found to have Bil LE fxs and now is s/p ORIF of right calcaneus and right subtalar joint dislocation; closed reduction of left calcaneus fx, right lateral malleolus fx; closed reduction and percutaneous fixation of left talonavicular joint subluxation; and percutaneous fixation of left navicular fx. 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 PHMx: dementia, prior ABI >20 years ago.    PT Comments  Pt received in supine, agreeable to therapy session, spouse present and encouraging. Pt needing up to minA with mod multimodal cues for NWB BLE precs while performing lateral scoot transfer from bed to drop arm chair on her R side. Pt agreeable to PTA wheeling her into hallway by large window for her to perform seated BLE exercises in recliner, then totalA to wheel back into room (no wheelchair in smaller size available at time of session). Patient will benefit from continued inpatient follow up therapy, <3 hours/day.    If plan is discharge home, recommend the following: A lot of help with bathing/dressing/bathroom;Assistance with cooking/housework;Direct supervision/assist for medications management;Direct supervision/assist for financial management;Assist for transportation;Help with stairs or ramp for entrance;Supervision due to cognitive status;Two people to help with walking and/or transfers   Can travel by private vehicle     No  Equipment Recommendations  Other (comment) (TBD at SNF, pt currently needs hospital bed and wheelchair with removable arm and leg rests, elevating leg rests, may need slide board vs hoyer)    Recommendations for Other Services        Precautions / Restrictions Precautions Precautions: Fall Recall of Precautions/Restrictions: Impaired Precaution/Restrictions Comments: CAM boots when OOB, sundowning Restrictions Weight Bearing Restrictions Per Provider Order: Yes RLE Weight Bearing Per Provider Order: Non weight bearing LLE Weight Bearing Per Provider Order: Non weight bearing Other Position/Activity Restrictions: Per Camie Moores, PA-C on 12/13 via secure chat. That is fine to allow her to have her feet on the ground in the CAM boots to assist with lateral scoot but do not want her trying to stand and transfer.     Mobility  Bed Mobility Overal bed mobility: Needs Assistance Bed Mobility: Supine to Sit, Rolling Rolling: Min assist, Used rails   Supine to sit: Supervision     General bed mobility comments: supervision for maintaining precautions and safety; up to minA as well as L/R rolling while donning hospital briefs prior to EOB transfer as pt only partially rolls until given minA pad assist.    Transfers Overall transfer level: Needs assistance Equipment used: None Transfers: Bed to chair/wheelchair/BSC            Lateral/Scoot Transfers: Min assist, From elevated surface General transfer comment: min A, good BUE strength, constant cues to maintain safety and NWB precautions due to dementia, but with pillow under pt bil CAM boots, pt with better maintenance of BLE NWB precs; bed pad assist to scoot. Scooting to her R side to get to chair this session as she has typically been performing scoots toward her L side in previous PT sessions. Pt defers PTA to locate wheelchair for hallway mobility, but then asking if she can go in hall, so PTA pushed her recliner in hallway to look out large window while  pt working on seated LE exercises during session.    Ambulation/Gait                   Stairs             Wheelchair Mobility     Tilt Bed    Modified Rankin (Stroke Patients Only)        Balance Overall balance assessment: Needs assistance Sitting-balance support: No upper extremity supported, Feet supported Sitting balance-Leahy Scale: Good Sitting balance - Comments: impulsive, cueing for safety and maintaining NWB precautions       Standing balance comment: unable to stand due to NWB BLE orders                            Communication Communication Communication: Impaired Factors Affecting Communication: Hearing impaired  Cognition Arousal: Alert Behavior During Therapy: Restless, WFL for tasks assessed/performed   PT - Cognitive impairments: History of cognitive impairments, Attention, Sequencing, Problem solving, Safety/Judgement                       PT - Cognition Comments: pt pleasant and eager to mobilize. pt was able to follow simple commands with multimodal cues, assist to maintain bilat LE NWB as pt forgets when scooting. Spouse in room, no sitter present in room today and pt cooperative throughout. Following commands: Intact      Cueing Cueing Techniques: Verbal cues, Gestural cues, Tactile cues, Visual cues  Exercises Other Exercises Other Exercises: chair push-ups x5 reps in recliner Other Exercises: seated BLE AROM: LAQ and hip flexion x20 reps ea    General Comments General comments (skin integrity, edema, etc.): Distal BLE ace wrapped, no acute s/sx distress, pt agreeable to PTA assisting her to don CAM boots with totalA to don, prior to transfer OOB training. Pt assisted to don hospital surgical briefs and maxi-pad for comfort prior to working on transfer training.      Pertinent Vitals/Pain Pain Assessment Pain Assessment: PAINAD Breathing: normal Negative Vocalization: none Facial Expression: smiling or inexpressive Body Language: relaxed Consolability: no need to console PAINAD Score: 0 Pain Intervention(s): Monitored during session, Repositioned    Home Living                          Prior  Function            PT Goals (current goals can now be found in the care plan section) Acute Rehab PT Goals Patient Stated Goal: Patient eager to work on getting OOB and to prepare for post-discharge mobility PT Goal Formulation: Patient unable to participate in goal setting Time For Goal Achievement: 05/08/24 (supervising PT notified pt due for goal update, rec WC mobility goal add) Progress towards PT goals: Progressing toward goals    Frequency    Min 3X/week      PT Plan      Co-evaluation              AM-PAC PT 6 Clicks Mobility   Outcome Measure  Help needed turning from your back to your side while in a flat bed without using bedrails?: A Little Help needed moving from lying on your back to sitting on the side of a flat bed without using bedrails?: A Little Help needed moving to and from a bed to a chair (including a wheelchair)?: A Lot Help needed standing up from a chair  using your arms (e.g., wheelchair or bedside chair)?: Total Help needed to walk in hospital room?: Total Help needed climbing 3-5 steps with a railing? : Total 6 Click Score: 11    End of Session Equipment Utilized During Treatment: Other (comment) (CAM boots, bed pad) Activity Tolerance: Patient tolerated treatment well Patient left: in chair;with call bell/phone within reach;with chair alarm set;with family/visitor present;Other (comment) (spouse in room) Nurse Communication: Mobility status;Need for lift equipment;Other (comment);Weight bearing status;Patient requests pain meds (pt has drop arm BSC in her bathroom she can use if set up next to bed/chair PRN, with bed pad assist for scooting, then lean L/R to remove pad beneath her prior to toileting; safe technique to return to bed via lateral scoot; bed linens removed -dirty) PT Visit Diagnosis: Pain;Other abnormalities of gait and mobility (R26.89) Pain - part of body: Ankle and joints of foot     Time: 1346-1414 PT Time Calculation  (min) (ACUTE ONLY): 28 min  Charges:    $Therapeutic Exercise: 8-22 mins $Therapeutic Activity: 8-22 mins PT General Charges $$ ACUTE PT VISIT: 1 Visit                     Lyn Joens P., PTA Acute Rehabilitation Services Secure Chat Preferred 9a-5:30pm Office: [phone]    Connell HERO [facility] 05/12/2024, 2:33 PM

## 2024-05-13 NOTE — TOC Progression Note (Signed)
 Transition of Care [facility]) - Progression Note    Patient Details  Name: Shirley Woods MRN: [id number] Date of Birth: [birth year]  Transition of Care [facility]) CM/SW Contact  Leovanni Bjorkman E Markies Mowatt, LCSW Phone Number: 05/13/2024, 8:35 AM  Clinical Narrative:    CSW requested CMA start Hulan barrows tomorrow for expected admit to [facility] on Monday 1/5.   Expected Discharge Plan: Skilled Nursing Facility Barriers to Discharge: Continued Medical Work up               Expected Discharge Plan and Services   Discharge Planning Services: CM Consult   Living arrangements for the past 2 months: Assisted Living Facility (Memory Care)                                       Social Drivers of Health (SDOH) Interventions SDOH Screenings   Food Insecurity: No Food Insecurity (04/23/2024)  Housing: Low Risk (04/23/2024)  Transportation Needs: No Transportation Needs (04/23/2024)  Utilities: Not At Risk (04/23/2024)  Social Connections: Patient Unable To Answer (04/23/2024)    Readmission Risk Interventions     No data to display

## 2024-05-13 NOTE — Plan of Care (Signed)
°  Problem: Clinical Measurements: Goal: Will remain free from infection Outcome: Progressing   Problem: Nutrition: Goal: Adequate nutrition will be maintained Outcome: Progressing   Problem: Coping: Goal: Level of anxiety will decrease Outcome: Progressing

## 2024-05-13 NOTE — Progress Notes (Signed)
 Bilateral dressings changed on feet

## 2024-05-13 NOTE — Progress Notes (Signed)
 20 Days Post-Op   Subjective/Chief Complaint: Resting comfortably Tolerating PO's   Objective: Vital signs in last 24 hours: Temp:  [97.6 F (36.4 C)-98.2 F (36.8 C)] 97.7 F (36.5 C) (12/31 2100) Pulse Rate:  [72-99] 99 (12/31 2100) Resp:  [17-18] 18 (12/31 2100) BP: (128-141)/(54-70) 141/70 (12/31 2100) SpO2:  [96 %-99 %] 98 % (12/31 2100) Last BM Date : 05/11/24  Intake/Output from previous day: 12/31 0701 - 01/01 0700 In: 240 [P.O.:240] Out: -  Intake/Output this shift: Total I/O In: 120 [P.O.:120] Out: -   General: Pleasant female who is laying in bed in NAD. HEENT: Head is normocephalic, atraumatic.  Heart: HR normal during encounter. Palpable radial and pedal pulses bilaterally Lungs: Respiratory effort nonlabored. Abd: Soft, NT, ND. No rebound tenderness or guarding.  MS: Able to move all 4 extremities. Dressing around ankles bilaterally.  Skin: Warm and dry.     Studies/Results: No results found.  Anti-infectives: Anti-infectives (From admission, onward)    Start     Dose/Rate Route Frequency Ordered Stop   04/23/24 2000  ceFAZolin  (ANCEF ) IVPB 2g/100 mL premix        2 g 200 mL/hr over 30 Minutes Intravenous Every 8 hours 04/23/24 1509 04/24/24 1259   04/23/24 1259  vancomycin  (VANCOCIN ) powder  Status:  Discontinued          As needed 04/23/24 1259 04/23/24 1338   04/23/24 0915  ceFAZolin  (ANCEF ) IVPB 2g/100 mL premix        2 g 200 mL/hr over 30 Minutes Intravenous To ShortStay Surgical 04/23/24 0858 04/23/24 1304   04/23/24 0907  ceFAZolin  (ANCEF ) 2-4 GM/100ML-% IVPB       Note to Pharmacy: Jonda Yancy HERO: cabinet override      04/23/24 0907 04/23/24 1234   04/22/24 1845  ceFAZolin  (ANCEF ) IVPB 2g/100 mL premix        2 g 200 mL/hr over 30 Minutes Intravenous  Once 04/22/24 1836 04/22/24 1908       Assessment/Plan: Jump from 3rd floor window at memory care    Open R ankle fx, B calcaneus fx, L 2nd metatarsal fx  - ortho c/s, Dr.  Fidel, handoff to Dr. Kendal, ancef  given in TB, OR 12/12 Dr Kendal - ORIF Rt calcaneus, Closed treatment of left calcaneus fracture, right lateral malleolus fracture & Closed reduction and percutaneous fixation of left talonavicular joint subluxation, Percutaneous fixation of left navicular fracture - NWB b/l LE- discussed with NS and ortho - ok for 325 mg ASA BID on DC for VTE prophylaxis.    L SDH  - NSGY c/s, Dr. Lanis, keppra  x7d for sz ppx (completed), repeat head CT stable.   Agitation - resolved   --Psych has consulted and recommends continuation of seroquel  50 mg twice a day and 100 mg at night; Continuation of zoloft  100 mg per day and ativan  for anxiety as needed. No psychiatric contraindications to discharge.   FEN - Reg diet DVT - SCDs, started subcu heparin , ASA ID - None Foley - None   Dispo - 6N, continue PT/OT, TOC working on SNF.  LOS: 21 days    Shirley Woods 05/13/2024

## 2024-05-13 NOTE — Plan of Care (Signed)
  Problem: Education: Goal: Knowledge of General Education information will improve Description: Including pain rating scale, medication(s)/side effects and non-pharmacologic comfort measures Outcome: Progressing   Problem: Health Behavior/Discharge Planning: Goal: Ability to manage health-related needs will improve Outcome: Progressing   Problem: Coping: Goal: Level of anxiety will decrease Outcome: Progressing   Problem: Activity: Goal: Risk for activity intolerance will decrease Outcome: Not Progressing

## 2024-05-14 NOTE — Progress Notes (Signed)
 "  Progress Note  21 Days Post-Op  Subjective: No complaints. Tolerating PO intake. Having bowel function. Denies pain, n/v, chest pain, SOB.  ROS  All negative with the exception of above.  Objective: Vital signs in last 24 hours: Temp:  [97.5 F (36.4 C)-98.2 F (36.8 C)] 98 F (36.7 C) (01/02 0527) Pulse Rate:  [79-89] 85 (01/02 0527) Resp:  [17-18] 17 (01/02 0527) BP: (124-139)/(58-73) 128/58 (01/02 0527) SpO2:  [93 %-100 %] 93 % (01/02 0527) Last BM Date : 05/13/24  Intake/Output from previous day: 01/01 0701 - 01/02 0700 In: 240 [P.O.:240] Out: -  Intake/Output this shift: No intake/output data recorded.  PE: General: Pleasant female who is laying in bed in NAD. HEENT: Head is normocephalic, atraumatic.  Heart: HR normal during encounter. Palpable radial and pedal pulses bilaterally Lungs: Respiratory effort nonlabored. Abd: Soft, NT, ND. No rebound tenderness or guarding.  MS: Able to move all 4 extremities. Dressing around ankles bilaterally.  Skin: Warm and dry.    Lab Results:  No results for input(s): WBC, HGB, HCT, PLT in the last 72 hours. BMET No results for input(s): NA, K, CL, CO2, GLUCOSE, BUN, CREATININE, CALCIUM in the last 72 hours. PT/INR No results for input(s): LABPROT, INR in the last 72 hours. CMP     Component Value Date/Time   NA 137 04/30/2024 1305   K 4.1 04/30/2024 1305   CL 105 04/30/2024 1305   CO2 22 04/30/2024 1305   GLUCOSE 100 (H) 04/30/2024 1305   BUN 15 04/30/2024 1305   CREATININE 0.74 04/30/2024 1305   CALCIUM 9.3 04/30/2024 1305   PROT 6.8 04/22/2024 1840   ALBUMIN 3.8 04/22/2024 1840   AST 48 (H) 04/22/2024 1840   ALT 34 04/22/2024 1840   ALKPHOS 73 04/22/2024 1840   BILITOT 0.8 04/22/2024 1840   GFRNONAA >60 04/30/2024 1305   Lipase  No results found for: LIPASE     Studies/Results: No results found.  Anti-infectives: Anti-infectives (From admission, onward)    Start      Dose/Rate Route Frequency Ordered Stop   04/23/24 2000  ceFAZolin  (ANCEF ) IVPB 2g/100 mL premix        2 g 200 mL/hr over 30 Minutes Intravenous Every 8 hours 04/23/24 1509 04/24/24 1259   04/23/24 1259  vancomycin  (VANCOCIN ) powder  Status:  Discontinued          As needed 04/23/24 1259 04/23/24 1338   04/23/24 0915  ceFAZolin  (ANCEF ) IVPB 2g/100 mL premix        2 g 200 mL/hr over 30 Minutes Intravenous To ShortStay Surgical 04/23/24 0858 04/23/24 1304   04/23/24 0907  ceFAZolin  (ANCEF ) 2-4 GM/100ML-% IVPB       Note to Pharmacy: Jonda Yancy HERO: cabinet override      04/23/24 0907 04/23/24 1234   04/22/24 1845  ceFAZolin  (ANCEF ) IVPB 2g/100 mL premix        2 g 200 mL/hr over 30 Minutes Intravenous  Once 04/22/24 1836 04/22/24 1908        Assessment/Plan Jump from 3rd floor window at memory care    Open R ankle fx, B calcaneus fx, L 2nd metatarsal fx  - ortho c/s, Dr. Fidel, handoff to Dr. Kendal, ancef  given in TB, OR 12/12 Dr Kendal - ORIF Rt calcaneus, Closed treatment of left calcaneus fracture, right lateral malleolus fracture & Closed reduction and percutaneous fixation of left talonavicular joint subluxation, Percutaneous fixation of left navicular fracture - NWB b/l LE- discussed with  NS and ortho - ok for 325 mg ASA BID on DC for VTE prophylaxis.    L SDH  - NSGY c/s, Dr. Lanis, keppra  x7d for sz ppx (completed), repeat head CT stable.   Agitation - resolved   --Psych has consulted plan for Seroquel  50 mg twice a day and 100 mg at night. The recommend to evaluate side effect profile and if patient shows excessive sedation this can be tapered as an outpatient to the lowest effective and tolerable dose. Continue Zoloft  100 mg a day. Continue as needed Ativan  by mouth for anxiety   FEN - Reg diet DVT - SCDs, subcu heparin , ASA ID - None Foley - None    LOS: 22 days   I reviewed specialist notes, consulting provider notes, nursing notes, last 24 h vitals and  pain scores, last 48 h intake and output, last 24 h labs and trends, and last 24 h imaging results.  This care required moderate level of medical decision making.   Shirley Woods, [facility] 05/14/2024, 8:04 AM Please see Amion for pager number during day hours 7:00am-4:30pm  "

## 2024-05-14 NOTE — Progress Notes (Signed)
 Physical Therapy Treatment Patient Details Name: Shirley Woods MRN: [id number] DOB: 11-08-1940 Today's Date: 05/14/2024   History of Present Illness Shirley Woods is an 84 y.o. female who fell out of a 3rd story window trying to escape from memory care unit at [facility]. Upon arrival found to have Bil LE fxs and now is s/p ORIF of right calcaneus and right subtalar joint dislocation; closed reduction of left calcaneus fx, right lateral malleolus fx; closed reduction and percutaneous fixation of left talonavicular joint subluxation; and percutaneous fixation of left navicular fx. 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 PHMx: dementia, prior ABI >20 years ago.    PT Comments  Pt pleasant but confused. Pt perseverating on how much fun she had last night and all the laughing she did. Pt re-oriented to being in hospital however no carry over. Pt with improved ability to laterally scoot from EOB to drop arm recliner but continues to require verbal cues to not put weight on bilat LEs. Pt will need a w/c for primary mode of mobility until she is able to WB through LEs. Acute PT to cont to follow.    If plan is discharge home, recommend the following: A lot of help with bathing/dressing/bathroom;Assistance with cooking/housework;Direct supervision/assist for medications management;Direct supervision/assist for financial management;Assist for transportation;Help with stairs or ramp for entrance;Supervision due to cognitive status;Two people to help with walking and/or transfers   Can travel by private vehicle     No  Equipment Recommendations  Other (comment) (TBD at SNF, pt currently needs hospital bed and wheelchair with removable arm and leg rests, elevating leg rests, may need slide board vs hoyer)    Recommendations for Other Services       Precautions / Restrictions Precautions Precautions: Fall Recall of Precautions/Restrictions:  Impaired Precaution/Restrictions Comments: CAM boots when OOB, sundowning Restrictions Weight Bearing Restrictions Per Provider Order: Yes RLE Weight Bearing Per Provider Order: Non weight bearing LLE Weight Bearing Per Provider Order: Non weight bearing Other Position/Activity Restrictions: Per Camie Moores, PA-C on 12/13 via secure chat. That is fine to allow her to have her feet on the ground in the CAM boots to assist with lateral scoot but do not want her trying to stand and transfer.     Mobility  Bed Mobility Overal bed mobility: Needs Assistance Bed Mobility: Supine to Sit, Rolling Rolling: Used rails, Supervision   Supine to sit: Contact guard     General bed mobility comments: pt quick to move today, used UEs to scoot hips forward    Transfers Overall transfer level: Needs assistance Equipment used: None Transfers: Bed to chair/wheelchair/BSC            Lateral/Scoot Transfers: Min assist, From elevated surface General transfer comment: bilat Cam boots donned, pt able to use UEs and lateral scoot over to chair. verbal cues to not put too much weight on her LEs    Ambulation/Gait               General Gait Details: unable due to bilat LE NWB   Stairs             Wheelchair Mobility     Tilt Bed    Modified Rankin (Stroke Patients Only)       Balance Overall balance assessment: Needs assistance Sitting-balance support: No upper extremity supported, Feet supported Sitting balance-Leahy Scale: Good Sitting balance - Comments: impulsive, cueing for safety and maintaining NWB precautions  Standing balance comment: unable to stand due to NWB BLE orders                            Communication Communication Communication: Impaired Factors Affecting Communication: Hearing impaired  Cognition Arousal: Alert Behavior During Therapy: WFL for tasks assessed/performed   PT - Cognitive impairments: History of cognitive  impairments, Attention, Sequencing, Problem solving, Safety/Judgement                       PT - Cognition Comments: pt awoken from sleep. Pt excited to see PT stating Oh i'm exhausted. We stayed up way to late last night just laughing, not even drinking. pt re-oriented however no carry over. Pt very pleasant and perseverating on how much she laughed last night and how much she fun she had and she didn't even hurt her feet. Following commands: Intact      Cueing Cueing Techniques: Verbal cues, Gestural cues, Tactile cues, Visual cues  Exercises Other Exercises Other Exercises: chair push-ups x5 reps in recliner Other Exercises: seated BLE AROM: LAQ and hip flexion x20 reps ea    General Comments General comments (skin integrity, edema, etc.): VSS      Pertinent Vitals/Pain Pain Assessment Pain Assessment: Faces Faces Pain Scale: No hurt    Home Living                          Prior Function            PT Goals (current goals can now be found in the care plan section) Acute Rehab PT Goals PT Goal Formulation: Patient unable to participate in goal setting Time For Goal Achievement: 05/28/24 Progress towards PT goals: Progressing toward goals    Frequency    Min 3X/week      PT Plan      Co-evaluation              AM-PAC PT 6 Clicks Mobility   Outcome Measure  Help needed turning from your back to your side while in a flat bed without using bedrails?: A Little Help needed moving from lying on your back to sitting on the side of a flat bed without using bedrails?: A Little Help needed moving to and from a bed to a chair (including a wheelchair)?: A Lot Help needed standing up from a chair using your arms (e.g., wheelchair or bedside chair)?: Total Help needed to walk in hospital room?: Total Help needed climbing 3-5 steps with a railing? : Total 6 Click Score: 11    End of Session Equipment Utilized During Treatment: Other  (comment) (CAM boots) Activity Tolerance: Patient tolerated treatment well Patient left: in chair;with call bell/phone within reach;with chair alarm set Nurse Communication: Mobility status PT Visit Diagnosis: Pain;Other abnormalities of gait and mobility (R26.89) Pain - Right/Left: Right (bil) Pain - part of body: Ankle and joints of foot     Time: 0947-1000 PT Time Calculation (min) (ACUTE ONLY): 13 min  Charges:    $Therapeutic Activity: 8-22 mins PT General Charges $$ ACUTE PT VISIT: 1 Visit                     Norene Ames, PT, DPT Acute Rehabilitation Services Secure chat preferred Office #: [phone]    Norene CHRISTELLA Ames 05/14/2024, 11:25 AM

## 2024-05-14 NOTE — TOC Progression Note (Addendum)
 Transition of Care [facility]) - Progression Note    Patient Details  Name: Shirley Woods MRN: [id number] Date of Birth: 11-29-1940  Transition of Care [facility]) CM/SW Contact  Zendaya Groseclose LITTIE Moose, [place] Phone Number: 05/14/2024, 11:05 AM  Clinical Narrative:    CMA initiated insurance auth process for [name]. Ref# [id number]. CSW will continue to follow.  3:07 PM- CSW received insurance geographical information systems officer for [name]. Auth ID #[id number] effective 1/5-1/11/16. CSW confirmed bed availability with facility, [facility] can accept following hospital DC. CSW will continue to follow.   Expected Discharge Plan: Skilled Nursing Facility Barriers to Discharge: Continued Medical Work up               Expected Discharge Plan and Services   Discharge Planning Services: CM Consult   Living arrangements for the past 2 months: Assisted Living Facility (Memory Care)                                       Social Drivers of Health (SDOH) Interventions SDOH Screenings   Food Insecurity: No Food Insecurity (04/23/2024)  Housing: Low Risk (04/23/2024)  Transportation Needs: No Transportation Needs (04/23/2024)  Utilities: Not At Risk (04/23/2024)  Social Connections: Patient Unable To Answer (04/23/2024)    Readmission Risk Interventions     No data to display

## 2024-05-17 MED ORDER — DOCUSATE SODIUM 100 MG PO CAPS: 100.0000 mg | ORAL_CAPSULE | Freq: Two times a day (BID) | ORAL | Status: DC

## 2024-05-17 MED ORDER — ENSURE PLUS HIGH PROTEIN PO LIQD: 237.0000 mL | Freq: Two times a day (BID) | ORAL | Status: AC

## 2024-05-17 MED ORDER — SERTRALINE HCL 100 MG PO TABS: 100.0000 mg | ORAL_TABLET | Freq: Every evening | ORAL | Status: DC

## 2024-05-17 MED ORDER — LORAZEPAM 0.5 MG PO TABS: 0.5000 mg | ORAL_TABLET | Freq: Four times a day (QID) | ORAL | 0 refills | Status: AC | PRN

## 2024-05-17 MED ORDER — ASPIRIN 325 MG PO TABS: 325.0000 mg | ORAL_TABLET | Freq: Two times a day (BID) | ORAL | Status: DC

## 2024-05-17 MED ORDER — POLYETHYLENE GLYCOL 3350 17 G PO PACK: 17.0000 g | PACK | Freq: Every day | ORAL | Status: AC

## 2024-05-17 MED ORDER — POLYETHYLENE GLYCOL 3350 17 G PO PACK: 17.0000 g | PACK | Freq: Every day | ORAL | Status: DC | PRN

## 2024-05-17 MED ORDER — ACETAMINOPHEN 500 MG PO TABS: 1000.0000 mg | ORAL_TABLET | Freq: Four times a day (QID) | ORAL | Status: DC

## 2024-05-17 MED ORDER — VITAMIN D3 25 MCG PO TABS: 2000.0000 [IU] | ORAL_TABLET | Freq: Every day | ORAL | Status: DC

## 2024-05-17 MED ORDER — QUETIAPINE FUMARATE 100 MG PO TABS: 100.0000 mg | ORAL_TABLET | Freq: Every day | ORAL | Status: AC

## 2024-05-17 MED ORDER — ORAL CARE MOUTH RINSE: 15.0000 mL | OROMUCOSAL | Status: DC | PRN

## 2024-05-17 MED ORDER — QUETIAPINE FUMARATE 50 MG PO TABS: 50.0000 mg | ORAL_TABLET | Freq: Two times a day (BID) | ORAL | Status: AC

## 2024-05-17 NOTE — Progress Notes (Addendum)
 Attempted to call report, nurse at [facility] unavailable. Gave number for callback  Unable to print AVS, no discharge order. Messaged MD via secure chat

## 2024-05-17 NOTE — Plan of Care (Signed)
  Problem: Education: Goal: Knowledge of General Education information will improve Description: Including pain rating scale, medication(s)/side effects and non-pharmacologic comfort measures Outcome: Progressing   Problem: Elimination: Goal: Will not experience complications related to urinary retention Outcome: Progressing   Problem: Pain Managment: Goal: General experience of comfort will improve and/or be controlled Outcome: Progressing   Problem: Safety: Goal: Ability to remain free from injury will improve Outcome: Progressing

## 2024-05-17 NOTE — TOC Progression Note (Signed)
 Transition of Care [facility]) - Progression Note    Patient Details  Name: Shirley Woods MRN: [id number] Date of Birth: 10/04/1940  [facility]) CM/SW Contact  Tkeyah Burkman E Atalia Litzinger, LCSW Phone Number: 05/17/2024, 9:58 AM  Clinical Narrative:    Florence Grate at [facility] to follow up on if patient can admit today, left VM requesting return call.  9:55- Call from daughter in law Sherri who states they have set up a sitter for patient and are ready for her to DC today pending [facility]'s approval.  9:58- Called Brittany at [facility] again who states their Leadership is having a meeting at 10 am and following up with the family then will have an official answer on if patient can admit today.   Expected Discharge Plan: Skilled Nursing Facility Barriers to Discharge: Continued Medical Work up               Expected Discharge Plan and Services   Discharge Planning Services: CM Consult   Living arrangements for the past 2 months: Assisted Living Facility (Memory Care)                                       Social Drivers of Health (SDOH) Interventions SDOH Screenings   Food Insecurity: No Food Insecurity (04/23/2024)  Housing: Low Risk (04/23/2024)  Transportation Needs: No Transportation Needs (04/23/2024)  Utilities: Not At Risk (04/23/2024)  Social Connections: Patient Unable To Answer (04/23/2024)    Readmission Risk Interventions     No data to display

## 2024-05-17 NOTE — TOC Transition Note (Addendum)
 Transition of Care [facility]) - Discharge Note   Patient Details  Name: Shirley Woods MRN: [id number] Date of Birth: 1940/08/01  Transition of Care [facility]) CM/SW Contact:  Harold Moncus E Heraclio Seidman, LCSW Phone Number: 05/17/2024, 12:16 PM   Clinical Narrative:    Discharge to [facility] today. Room 405. Confirmed with Admissions Worker Brittany. Updated MD, RN, and patient's daughter in law Oxly.  Asked RN to call report. EMS paperwork completed. [facility] arranged for 2:30 pm pick up (pending truck availability) per family/facility request.  3:20- Called [facility] per daughter in law request, spoke to [place] to follow up on truck availability. Patient is 4th on the list, but they are going to move patient to the top of the list for next available truck.  4:05- Called [facility] per facility request for update on pick up. They confirm she is still next on the list, states the absolute longest would be 1 hour.  4:10- Call from [facility] - they state at this point they cannot take patient until tomorrow because they are worried about it getting closer to the evening, and patient's history of sundowning as well as admin staff leaving soon. They request DC tomorrow morning, earliest is 8 am.  CSW called [facility] back for a more exact ETA- they are on the way from [place] and should be here within 30 minutes. Called [facility] back, they state they cannot take patient today and that CSW needs to update the family - updated TOC Supervsior.  4:40- TOC Supervisor has spoken to Company Secretary at [name]. Patient's DC will be delayed to tomorrow 05/18/24.  CSW called [facility] and arranged earliest pick up, per Dick they will pick up around 8:15-8:20 am tomorrow.  Updated daughter in law Sherri who is agreeable to this plan. Updated Trauma Providers and bedside RN. Trauma Provider to update DC Summary in the morning before scheduled pick up. RN to call report again in the morning to [phone]  Left message for Brittany at  [facility]. Updated date on Med Necessity Form.     Final next level of care: Skilled Nursing Facility Barriers to Discharge: Barriers Resolved   Patient Goals and CMS Choice   CMS Medicare.gov Compare Post Acute Care list provided to:: Patient Represenative (must comment) Choice offered to / list presented to : Adult Children, Spouse      Discharge Placement              Patient chooses bed at: [facility] Patient to be transferred to facility by: [facility] Name of family member notified: Maeola - daughter in law Patient and family notified of of transfer: 05/17/24  Discharge Plan and Services Additional resources added to the After Visit Summary for     Discharge Planning Services: CM Consult                                 Social Drivers of Health (SDOH) Interventions SDOH Screenings   Food Insecurity: No Food Insecurity (04/23/2024)  Housing: Low Risk (04/23/2024)  Transportation Needs: No Transportation Needs (04/23/2024)  Utilities: Not At Risk (04/23/2024)  Social Connections: Patient Unable To Answer (04/23/2024)     Readmission Risk Interventions     No data to display

## 2024-05-18 ENCOUNTER — Encounter (HOSPITAL_BASED_OUTPATIENT_CLINIC_OR_DEPARTMENT_OTHER): Payer: Self-pay

## 2024-05-18 NOTE — Discharge Instructions (Addendum)
 "  Orthopaedic Trauma Service Discharge Instructions  Discharge Wound Care Instructions  Wound Care: You may remove your surgical dressings on 05/18/2024.  Surgical incisions should be dressed daily using one layer of adaptic or Mepitel, then gauze, Kerlix, and an ace wrap.  Okay to allow both legs to get wet when showering starting 05/18/2024. Clean incision gently with soap and water.  Do NOT apply any ointments, solutions or lotions to pin sites or surgical wounds.  These prevent needed drainage and even though solutions like hydrogen peroxide kill bacteria, they also damage cells lining the pin sites that help fight infection.  Applying lotions or ointments can keep the wounds moist and can cause them to breakdown and open up as well. This can increase the risk for infection. When in doubt call the office.   Traumatic wounds should be dressed daily as well.    One layer of adaptic, gauze, Kerlix, then ace wrap.  The adaptic can be discontinued once the draining has ceased  General Discharge Instructions  WEIGHT BEARING STATUS: Nonweightbearing bilateral lower extremities.  Okay to have feet resting on the ground in cam boots to assist with transfers.  Do not want her standing on either leg   RANGE OF MOTION/ACTIVITY: Okay for motion as tolerated  DVT/PE prophylaxis: Aspirin  325 mg twice daily x 30 days  Diet: as you were eating previously.  Can use over the counter stool softeners and bowel preparations, such as Miralax , to help with bowel movements.  Narcotics can be constipating.  Be sure to drink plenty of fluids  PAIN MEDICATION USE AND EXPECTATIONS  You have likely been given narcotic medications to help control your pain.  After a traumatic event that results in an fracture (broken bone) with or without surgery, it is ok to use narcotic pain medications to help control one's pain.  We understand that everyone responds to pain differently and each individual patient will be evaluated on  a regular basis for the continued need for narcotic medications. Ideally, narcotic medication use should last no more than 6-8 weeks (coinciding with fracture healing).   As a patient it is your responsibility as well to monitor narcotic medication use and report the amount and frequency you use these medications when you come to your office visit.   We would also advise that if you are using narcotic medications, you should take a dose prior to therapy to maximize you participation.  IF YOU ARE ON NARCOTIC MEDICATIONS IT IS NOT PERMISSIBLE TO OPERATE A MOTOR VEHICLE (MOTORCYCLE/CAR/TRUCK/MOPED) OR HEAVY MACHINERY DO NOT MIX NARCOTICS WITH OTHER CNS (CENTRAL NERVOUS SYSTEM) DEPRESSANTS SUCH AS ALCOHOL  POST-OPERATIVE OPIOID TAPER INSTRUCTIONS: It is important to wean off of your opioid medication as soon as possible. If you do not need pain medication after your surgery it is ok to stop day one. Opioids include: Codeine, Hydrocodone (Norco, Vicodin), Oxycodone (Percocet, oxycontin ) and hydromorphone  amongst others.  Long term and even short term use of opiods can cause: Increased pain response Dependence Constipation Depression Respiratory depression And more.  Withdrawal symptoms can include Flu like symptoms Nausea, vomiting And more Techniques to manage these symptoms Hydrate well Eat regular healthy meals Stay active Use relaxation techniques(deep breathing, meditating, yoga) Do Not substitute Alcohol to help with tapering If you have been on opioids for less than two weeks and do not have pain than it is ok to stop all together.  Plan to wean off of opioids This plan should start within one week post op  of your fracture surgery  Maintain the same interval or time between taking each dose and first decrease the dose.  Cut the total daily intake of opioids by one tablet each day Next start to increase the time between doses. The last dose that should be eliminated is the evening  dose.    STOP SMOKING OR USING NICOTINE PRODUCTS!!!!  As discussed nicotine severely impairs your body's ability to heal surgical and traumatic wounds but also impairs bone healing.  Wounds and bone heal by forming microscopic blood vessels (angiogenesis) and nicotine is a vasoconstrictor (essentially, shrinks blood vessels).  Therefore, if vasoconstriction occurs to these microscopic blood vessels they essentially disappear and are unable to deliver necessary nutrients to the healing tissue.  This is one modifiable factor that you can do to dramatically increase your chances of healing your injury.  (This means no smoking, no nicotine gum, patches, etc)  DO NOT USE NONSTEROIDAL ANTI-INFLAMMATORY DRUGS (NSAID'S)  Using products such as Advil (ibuprofen), Aleve  (naproxen ), Motrin (ibuprofen) for additional pain control during fracture healing can delay and/or prevent the healing response.  If you would like to take over the counter (OTC) medication, Tylenol  (acetaminophen ) is ok.  However, some narcotic medications that are given for pain control contain acetaminophen  as well. Therefore, you should not exceed more than 4000 mg of tylenol  in a day if you do not have liver disease.  Also note that there are may OTC medicines, such as cold medicines and allergy medicines that my contain tylenol  as well.  If you have any questions about medications and/or interactions please ask your doctor/PA or your pharmacist.      ICE AND ELEVATE INJURED/OPERATIVE EXTREMITY  Using ice and elevating the injured extremity above your heart can help with swelling and pain control.  Icing in a pulsatile fashion, such as 20 minutes on and 20 minutes off, can be followed.    Do not place ice directly on skin. Make sure there is a barrier between to skin and the ice pack.    Using frozen items such as frozen peas works well as the conform nicely to the are that needs to be iced.  USE AN ACE WRAP OR TED HOSE FOR SWELLING  CONTROL  In addition to icing and elevation, Ace wraps or TED hose are used to help limit and resolve swelling.  It is recommended to use Ace wraps or TED hose until you are informed to stop.    When using Ace Wraps start the wrapping distally (farthest away from the body) and wrap proximally (closer to the body)   Example: If you had surgery on your leg or thing and you do not have a splint on, start the ace wrap at the toes and work your way up to the thigh        If you had surgery on your upper extremity and do not have a splint on, start the ace wrap at your fingers and work your way up to the upper arm   IF YOU ARE IN A CAM BOOT (BLACK BOOT)  You may remove boot periodically. Perform daily dressing changes as noted below.  Wash the liner of the boot regularly and wear a sock when wearing the boot. It is recommended that you sleep in the boot until told otherwise   CALL THE OFFICE FOR MEDICATION REFILLS OR WITH ANY QUESTIONS/CONCERNS: [phone]   VISIT OUR WEBSITE FOR ADDITIONAL INFORMATION: [email or web address]     Call office for the following:  Temperature greater than 101F Persistent nausea and vomiting Severe uncontrolled pain Redness, tenderness, or signs of infection (pain, swelling, redness, odor or green/yellow discharge around the site) Difficulty breathing, headache or visual disturbances Hives Persistent dizziness or light-headedness Extreme fatigue Any other questions or concerns you may have after discharge  In an emergency, call 911 or go to an Emergency Department at a nearby hospital  OTHER HELPFUL INFORMATION  If you had a block, it will wear off between 8-24 hrs postop typically.  This is period when your pain may go from nearly zero to the pain you would have had postop without the block.  This is an abrupt transition but nothing dangerous is happening.  You may take an extra dose of narcotic when this happens.  You should wean off your narcotic medicines as  soon as you are able.  Most patients will be off or using minimal narcotics before their first postop appointment.   We suggest you use the pain medication the first night prior to going to bed, in order to ease any pain when the anesthesia wears off. You should avoid taking pain medications on an empty stomach as it will make you nauseous.  Do not drink alcoholic beverages or take illicit drugs when taking pain medications.  In most states it is against the law to drive while you are in a splint or sling.  And certainly against the law to drive while taking narcotics.  You may return to work/school in the next couple of days when you feel up to it.   Pain medication may make you constipated.  Below are a few solutions to try in this order: Decrease the amount of pain medication if you arent having pain. Drink lots of decaffeinated fluids. Drink prune juice and/or each dried prunes  If the first 3 dont work start with additional solutions Take Colace - an over-the-counter stool softener Take Senokot - an over-the-counter laxative Take Miralax  - a stronger over-the-counter laxative     "

## 2024-05-18 NOTE — TOC Transition Note (Addendum)
 Transition of Care [facility]) - Discharge Note   Patient Details  Name: Shirley Woods MRN: [id number] Date of Birth: [birth year]  Transition of Care [facility]) CM/SW Contact:  Delayna Sparlin E Gaspard Isbell, LCSW Phone Number: 05/18/2024, 8:24 AM   Clinical Narrative:    Discharge to [facility] today. Room 405. Confirmed with Admissions Worker Brittany. Updated MD, RN, and patient's daughter in law [place].  Asked RN to call report. EMS paperwork completed. [facility] arranged for 8:15 am pick up (pending truck availability).  9:15- Notified by PA that patient needs to continue the lamotrigine  at 75mg /day, PA has updated the DC summary - called Brittany at [facility] who confirms they are aware of this change.  Final next level of care: Skilled Nursing Facility Barriers to Discharge: Barriers Resolved   Patient Goals and CMS Choice   CMS Medicare.gov Compare Post Acute Care list provided to:: Patient Represenative (must comment) Choice offered to / list presented to : Adult Children, Spouse      Discharge Placement              Patient chooses bed at: [facility] Patient to be transferred to facility by: [facility] Name of family member notified: Maeola - daughter in law Patient and family notified of of transfer: 05/17/24  Discharge Plan and Services Additional resources added to the After Visit Summary for     Discharge Planning Services: CM Consult                                 Social Drivers of Health (SDOH) Interventions SDOH Screenings   Food Insecurity: No Food Insecurity (04/23/2024)  Housing: Low Risk (04/23/2024)  Transportation Needs: No Transportation Needs (04/23/2024)  Utilities: Not At Risk (04/23/2024)  Social Connections: Patient Unable To Answer (04/23/2024)     Readmission Risk Interventions     No data to display

## 2024-05-18 NOTE — Plan of Care (Signed)
" °  Problem: Education: Goal: Knowledge of General Education information will improve Description: Including pain rating scale, medication(s)/side effects and non-pharmacologic comfort measures Outcome: Progressing   Problem: Health Behavior/Discharge Planning: Goal: Ability to manage health-related needs will improve Outcome: Progressing   Problem: Clinical Measurements: Goal: Will remain free from infection Outcome: Progressing   Problem: Activity: Goal: Risk for activity intolerance will decrease Outcome: Progressing   Problem: Coping: Goal: Level of anxiety will decrease Outcome: Progressing   Problem: Elimination: Goal: Will not experience complications related to urinary retention Outcome: Progressing   Problem: Pain Managment: Goal: General experience of comfort will improve and/or be controlled Outcome: Progressing   Problem: Safety: Goal: Ability to remain free from injury will improve Outcome: Progressing   Problem: Safety: Goal: Non-violent Restraint(s) Outcome: Progressing   "

## 2024-05-18 NOTE — Discharge Summary (Signed)
 Physician Discharge Summary  Patient ID: Shirley Woods MRN: [id number] DOB/AGE: May 21, 1940 84 y.o.  Admit date: 04/22/2024 Discharge date: 05/19/2023  Discharge Diagnoses Jump from 3rd floor window Open right ankle fracture Bilateral calcaneous fracture Left 2nd metatarsal fracture Left SDH ABL anemia, stable Thrombocytopenia, resolved  Consultants Orthopedic surgery Neurosurgery  Procedures Dr. Franky Haddix (04/23/24) CPT 28415-Open reduction internal fixation of right calcaneus fracture CPT 28585-Open reduction of right subtalar joint dislocation CPT 11012-Irrigation and debridement of right open calcaneus fracture CPT 28405-Closed treatment of left calcaneus fracture CPT 27788-Closed treatment of right lateral malleolus fracture CPT 28576-Closed reduction and percutaneous fixation of left talonavicular joint subluxation CPT 28456-Percutaneous fixation of left navicular fracture  HPI: Patient is an 84 year old female who presented as a level 1 trauma s/p jump from 3rd floor window while trying to escape from the memory care unit where she lives. Reported history of dementia on arrival. She was alert and communicative on arrival. Patient found to have an open right ankle fracture, bilateral calcaneal fractures, left 2nd metatarsal fracutre and left SDH. There was a question of left common femoral DVT and PE on initial imaging, patient underwent ultrasound which was negative  - both findings thought to be artifact. Patient was admitted to trauma ICU.   Hospital Course: Orthopedic surgery was consulted and recommended operative fixation, this was done 12/12 as outlined above. 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 Foley placed for urinary retention 12/12. Patient traumatically removed foley overnight from 12/13-12/14, was able to void spontaneously after this. Hgb gradually drifted down but noted to be stable 12/17, platelets were  initially <100K but gradually improved. She did receive 1 PRBC on 12/13. Transferred out of ICU 12/15. Patient initially recommended for DOAC on DC for DVT prophylaxis but after discussion with ortho and neurosurgery, ASA 325 mg BID started for DVT prophylaxis and should be continued until orthopedic surgery says she can stop this. She is to remain NWB to BLE until cleared for weight bearing by orthopedic surgery. She has a VAC to the RLE. CAM boots to be worn on BLE while out of bed.   Therapies evaluated patient throughout admission and ultimately recommended SNF for discharge. Patient still requires a memory care unit as well. On 05/19/23 patient was stable for DC to [facility] SNF with follow up as outlined below.     Allergies as of 05/18/2024   No Known Allergies      Medication List     STOP taking these medications    amLODipine  5 MG tablet Commonly known as: NORVASC    lamoTRIgine  150 MG tablet Commonly known as: LAMICTAL        TAKE these medications    acetaminophen  500 MG tablet Commonly known as: TYLENOL  Take 2 tablets (1,000 mg total) by mouth every 6 (six) hours. What changed:  how much to take when to take this reasons to take this   aspirin  325 MG tablet Take 1 tablet (325 mg total) by mouth 2 (two) times daily.   docusate sodium  100 MG capsule Commonly known as: COLACE Take 1 capsule (100 mg total) by mouth 2 (two) times daily.   donepezil  5 MG tablet Commonly known as: ARICEPT  Take 5 mg by mouth every evening.   feeding supplement Liqd Take 237 mLs by mouth 2 (two) times daily between meals.   LORazepam  0.5 MG tablet Commonly known as: ATIVAN  Take 1 tablet (0.5 mg total) by mouth every 6 (six)  hours as needed for anxiety (agitation). What changed:  reasons to take this Another medication with the same name was removed. Continue taking this medication, and follow the directions you see here.   mouth rinse Liqd solution 15 mLs by Mouth Rinse route  as needed (for oral care).   polyethylene glycol 17 g packet Commonly known as: MIRALAX  / GLYCOLAX  Take 17 g by mouth daily as needed (constipation).   polyethylene glycol 17 g packet Commonly known as: MIRALAX  / GLYCOLAX  Take 17 g by mouth daily.   QUEtiapine  100 MG tablet Commonly known as: SEROQUEL  Take 1 tablet (100 mg total) by mouth at bedtime. What changed: You were already taking a medication with the same name, and this prescription was added. Make sure you understand how and when to take each.   QUEtiapine  50 MG tablet Commonly known as: SEROQUEL  Take 1 tablet (50 mg total) by mouth 2 (two) times daily at 10 am and 4 pm. What changed: when to take this   sertraline  100 MG tablet Commonly known as: ZOLOFT  Take 1 tablet (100 mg total) by mouth every evening. What changed: how much to take   vitamin D3 25 MCG tablet Commonly known as: CHOLECALCIFEROL  Take 2 tablets (2,000 Units total) by mouth daily.   zinc gluconate 50 MG tablet Take 50 mg by mouth daily.          Contact information for follow-up providers     Lanis Pupa, MD. Call.   Specialty: Neurosurgery Why: As needed, history of subdural hematoma/traumatic brain injury. Contact information: [address] [place] [address] [phone]         Kendal Franky SQUIBB, MD Follow up.   Specialty: Orthopedic Surgery Why: our office is scheduling you for follow up. call to confirm. Contact information: [address] [place] [address] [phone]              Contact information for after-discharge care     Destination     [facility] .   Service: Skilled Nursing Contact information: [address] [place]  [address] [phone]                     Signed: Almarie GORMAN Pringle , [facility] Surgery 05/18/2024, 7:38 AM Please see Amion for pager number during day hours 7:00am-4:30pm

## 2024-05-18 NOTE — Progress Notes (Signed)
 Attempted to call report to [facility] at [phone]. Left vm for nursing director to call back for report.

## 2024-05-18 NOTE — Progress Notes (Signed)
"   Shirley Woods to be D/C'd  per MD order.  Discussed with the patient and all questions fully answered.  VSS, Skin clean, dry and intact without evidence of skin break down, no evidence of skin tears noted.  No PIV on assessment.   An After Visit Summary was printed and given to PTAR.  Patient instructed to return to ED, call 911, or call MD for any changes in condition.   Patient to be escorted via stretcher, and d'c to [facility] via [place]. Pt's spouse at bedside.  "

## 2024-05-20 ENCOUNTER — Emergency Department (HOSPITAL_COMMUNITY)

## 2024-05-20 ENCOUNTER — Other Ambulatory Visit: Payer: Self-pay

## 2024-05-20 ENCOUNTER — Emergency Department (HOSPITAL_COMMUNITY)
Admission: EM | Admit: 2024-05-20 | Discharge: 2024-05-21 | Disposition: A | Discharge status: Home / Self Care | Attending: Emergency Medicine | Admitting: Emergency Medicine

## 2024-05-20 DIAGNOSIS — R7401 Elevation of levels of liver transaminase levels: Secondary | ICD-10-CM | POA: Insufficient documentation

## 2024-05-20 DIAGNOSIS — Z7982 Long term (current) use of aspirin: Secondary | ICD-10-CM | POA: Insufficient documentation

## 2024-05-20 DIAGNOSIS — R451 Restlessness and agitation: Secondary | ICD-10-CM | POA: Diagnosis present

## 2024-05-20 DIAGNOSIS — I10 Essential (primary) hypertension: Secondary | ICD-10-CM | POA: Diagnosis not present

## 2024-05-20 DIAGNOSIS — R739 Hyperglycemia, unspecified: Secondary | ICD-10-CM

## 2024-05-20 DIAGNOSIS — R748 Abnormal levels of other serum enzymes: Secondary | ICD-10-CM | POA: Insufficient documentation

## 2024-05-20 DIAGNOSIS — F03918 Unspecified dementia, unspecified severity, with other behavioral disturbance: Secondary | ICD-10-CM | POA: Diagnosis not present

## 2024-05-20 DIAGNOSIS — Z79899 Other long term (current) drug therapy: Secondary | ICD-10-CM | POA: Diagnosis not present

## 2024-05-20 DIAGNOSIS — R7309 Other abnormal glucose: Secondary | ICD-10-CM | POA: Insufficient documentation

## 2024-05-20 DIAGNOSIS — F419 Anxiety disorder, unspecified: Secondary | ICD-10-CM | POA: Diagnosis present

## 2024-05-20 LAB — CBC WITH DIFFERENTIAL/PLATELET
Abs Immature Granulocytes: 0.02 K/uL (ref 0.00–0.07)
Basophils Absolute: 0 K/uL (ref 0.0–0.1)
Basophils Relative: 0 %
Eosinophils Absolute: 0.1 K/uL (ref 0.0–0.5)
Eosinophils Relative: 2 %
HCT: 36.7 % (ref 36.0–46.0)
Hemoglobin: 12 g/dL (ref 12.0–15.0)
Immature Granulocytes: 0 %
Lymphocytes Relative: 19 %
Lymphs Abs: 1.2 K/uL (ref 0.7–4.0)
MCH: 31.6 pg (ref 26.0–34.0)
MCHC: 32.7 g/dL (ref 30.0–36.0)
MCV: 96.6 fL (ref 80.0–100.0)
Monocytes Absolute: 0.8 K/uL (ref 0.1–1.0)
Monocytes Relative: 12 %
Neutro Abs: 4.3 K/uL (ref 1.7–7.7)
Neutrophils Relative %: 67 %
Platelets: 201 K/uL (ref 150–400)
RBC: 3.8 MIL/uL — ABNORMAL LOW (ref 3.87–5.11)
RDW: 14.8 % (ref 11.5–15.5)
WBC: 6.5 K/uL (ref 4.0–10.5)
nRBC: 0 % (ref 0.0–0.2)

## 2024-05-20 LAB — URINALYSIS, W/ REFLEX TO CULTURE (INFECTION SUSPECTED)
Bacteria, UA: NONE SEEN
Bilirubin Urine: NEGATIVE
Glucose, UA: NEGATIVE mg/dL
Hgb urine dipstick: NEGATIVE
Ketones, ur: NEGATIVE mg/dL
Nitrite: NEGATIVE
Protein, ur: NEGATIVE mg/dL
Specific Gravity, Urine: 1.008 (ref 1.005–1.030)
pH: 5 (ref 5.0–8.0)

## 2024-05-20 LAB — COMPREHENSIVE METABOLIC PANEL WITH GFR
ALT: 47 U/L — ABNORMAL HIGH (ref 0–44)
AST: 47 U/L — ABNORMAL HIGH (ref 15–41)
Albumin: 4.4 g/dL (ref 3.5–5.0)
Alkaline Phosphatase: 225 U/L — ABNORMAL HIGH (ref 38–126)
Anion gap: 12 (ref 5–15)
BUN: 21 mg/dL (ref 8–23)
CO2: 24 mmol/L (ref 22–32)
Calcium: 10.3 mg/dL (ref 8.9–10.3)
Chloride: 105 mmol/L (ref 98–111)
Creatinine, Ser: 0.86 mg/dL (ref 0.44–1.00)
GFR, Estimated: >60 mL/min
Glucose, Bld: 119 mg/dL — ABNORMAL HIGH (ref 70–99)
Potassium: 4 mmol/L (ref 3.5–5.1)
Sodium: 140 mmol/L (ref 135–145)
Total Bilirubin: 0.2 mg/dL (ref 0.0–1.2)
Total Protein: 7.6 g/dL (ref 6.5–8.1)

## 2024-05-20 LAB — ETHANOL: Alcohol, Ethyl (B): <15 mg/dL

## 2024-05-20 MED ORDER — LAMOTRIGINE 25 MG PO TABS: 25.0000 mg | ORAL_TABLET | Freq: Every day | ORAL | Status: DC

## 2024-05-20 MED ORDER — LORAZEPAM 0.5 MG PO TABS
0.5000 mg | ORAL_TABLET | Freq: Four times a day (QID) | ORAL | Status: DC | PRN
  Administered 2024-05-20: 0.5 mg via ORAL
  Filled 2024-05-20: qty 1

## 2024-05-20 MED ORDER — SERTRALINE HCL 50 MG PO TABS
100.0000 mg | ORAL_TABLET | Freq: Every evening | ORAL | Status: DC
  Administered 2024-05-20: 100 mg via ORAL
  Filled 2024-05-20: qty 2

## 2024-05-20 MED ORDER — POLYETHYLENE GLYCOL 3350 17 G PO PACK
17.0000 g | PACK | Freq: Every day | ORAL | Status: DC
  Administered 2024-05-20 – 2024-05-21 (×2): 17 g via ORAL
  Filled 2024-05-20: qty 1

## 2024-05-20 MED ORDER — LAMOTRIGINE 25 MG PO TABS
150.0000 mg | ORAL_TABLET | Freq: Every day | ORAL | Status: DC
  Administered 2024-05-20: 150 mg via ORAL
  Filled 2024-05-20: qty 2

## 2024-05-20 MED ORDER — HALOPERIDOL LACTATE 5 MG/ML IJ SOLN
INTRAMUSCULAR | Status: AC
  Administered 2024-05-20: 5 mg via INTRAMUSCULAR
  Filled 2024-05-20: qty 1

## 2024-05-20 MED ORDER — ALUM & MAG HYDROXIDE-SIMETH 200-200-20 MG/5ML PO SUSP: 30.0000 mL | Freq: Four times a day (QID) | ORAL | Status: DC | PRN

## 2024-05-20 MED ORDER — CYANOCOBALAMIN 500 MCG PO TABS: 500.0000 ug | ORAL_TABLET | ORAL | Status: DC

## 2024-05-20 MED ORDER — AMLODIPINE BESYLATE 5 MG PO TABS: 5.0000 mg | ORAL_TABLET | Freq: Every day | ORAL | Status: DC

## 2024-05-20 MED ORDER — ZINC SULFATE 220 (50 ZN) MG PO CAPS
220.0000 mg | ORAL_CAPSULE | Freq: Every day | ORAL | Status: DC
  Administered 2024-05-20 – 2024-05-21 (×2): 220 mg via ORAL
  Filled 2024-05-20 (×2): qty 1

## 2024-05-20 MED ORDER — QUETIAPINE FUMARATE 50 MG PO TABS
50.0000 mg | ORAL_TABLET | Freq: Two times a day (BID) | ORAL | Status: DC
  Administered 2024-05-20 – 2024-05-21 (×4): 50 mg via ORAL
  Filled 2024-05-20 (×3): qty 1

## 2024-05-20 MED ORDER — HALOPERIDOL LACTATE 5 MG/ML IJ SOLN: 5.0000 mg | Freq: Four times a day (QID) | INTRAMUSCULAR | Status: DC | PRN

## 2024-05-20 MED ORDER — HALOPERIDOL LACTATE 5 MG/ML IJ SOLN: 5.0000 mg | Freq: Once | INTRAMUSCULAR | Status: AC

## 2024-05-20 MED ORDER — DONEPEZIL HCL 5 MG PO TABS
5.0000 mg | ORAL_TABLET | Freq: Every day | ORAL | Status: DC
  Administered 2024-05-20: 5 mg via ORAL
  Filled 2024-05-20: qty 1

## 2024-05-20 MED ORDER — AMLODIPINE BESYLATE 5 MG PO TABS
2.5000 mg | ORAL_TABLET | Freq: Every day | ORAL | Status: DC
  Administered 2024-05-20 – 2024-05-21 (×2): 2.5 mg via ORAL
  Filled 2024-05-20 (×2): qty 1

## 2024-05-20 MED ORDER — ACETAMINOPHEN 325 MG PO TABS: 650.0000 mg | ORAL_TABLET | ORAL | Status: DC | PRN

## 2024-05-20 MED ORDER — VITAMIN D 25 MCG (1000 UNIT) PO TABS
2000.0000 [IU] | ORAL_TABLET | Freq: Every day | ORAL | Status: DC
  Administered 2024-05-20 – 2024-05-21 (×2): 2000 [IU] via ORAL
  Filled 2024-05-20 (×2): qty 2

## 2024-05-20 MED ORDER — LAMOTRIGINE 25 MG PO TABS: 75.0000 mg | ORAL_TABLET | Freq: Every day | ORAL | Status: DC

## 2024-05-20 MED ORDER — ENSURE PLUS HIGH PROTEIN PO LIQD
237.0000 mL | Freq: Two times a day (BID) | ORAL | Status: DC
  Filled 2024-05-20: qty 237

## 2024-05-20 MED ORDER — DOCUSATE SODIUM 100 MG PO CAPS
100.0000 mg | ORAL_CAPSULE | Freq: Two times a day (BID) | ORAL | Status: DC
  Administered 2024-05-20 – 2024-05-21 (×3): 100 mg via ORAL
  Filled 2024-05-20 (×3): qty 1

## 2024-05-20 MED ORDER — ONDANSETRON HCL 4 MG PO TABS: 4.0000 mg | ORAL_TABLET | Freq: Three times a day (TID) | ORAL | Status: DC | PRN

## 2024-05-20 MED ORDER — QUETIAPINE FUMARATE 100 MG PO TABS
100.0000 mg | ORAL_TABLET | Freq: Every day | ORAL | Status: DC
  Administered 2024-05-20: 100 mg via ORAL
  Filled 2024-05-20: qty 1

## 2024-05-20 MED ORDER — ZINC GLUCONATE 50 MG PO TABS: 50.0000 mg | ORAL_TABLET | Freq: Every day | ORAL | Status: DC

## 2024-05-20 MED ORDER — ASPIRIN 325 MG PO TABS
325.0000 mg | ORAL_TABLET | Freq: Two times a day (BID) | ORAL | Status: DC
  Administered 2024-05-20 – 2024-05-21 (×3): 325 mg via ORAL
  Filled 2024-05-20 (×4): qty 1

## 2024-05-20 NOTE — ED Notes (Signed)
 Pt very pleasant throughout my interactions. Appreciative of staff and has helped fold linen at her request.

## 2024-05-20 NOTE — ED Notes (Addendum)
 [facility] called Sheri Theurer, pts daughter in law and [facility]PO[facility] for additional collateral information. Per pts daughter in law, pt has had dementia a for a few years with it worsening about 1 month ago. Pt started sundowning with manic episodes. Per pts daughter, pt jumped out of a third story window at the [facility] memory care facility and was hospitalized at [facility] for 3 1/2 weeks after sustaining serious foot injuries. Pt was discharged to the [facility] rehab facility. Pts behaviors have increased since arriving to the facility, appearing more manic than usual.   Per pts daughter in law pt has been seen by the psychiatric provider at the facility one time and believes that some medication adjustments have been made. Pt was receiving a mood stabilizer which was discontinued when she was discharged from [facility] but has been restarted by her facility psych provider. Per pts daughter, pt has never been admitted to an inpatient psychiatric facility or received outpatient psychiatric treatment. Per pts daughter an MRI was done while pt was admitted to [facility] with no abnormalities. Pt has a scheduled out patient neurology appointment coming up this month.   [facility] explained that pt is not likely to be admitted to an inpatient psychiatric facility due to her dementia diagnosis and would be treated and stabilized while she is in the ED. Pts daughter in law said that she understood and hoped that pt would not have to be admitted inpatient. [facility] explained that after pt is assessed the [facility] provider would reach out to the facility provider to discuss the pt. [facility] agreed to keep pts daughter updated. Pts daughter in law was appreciative of the information.   Chesley Holt, [facility]  05/20/24

## 2024-05-20 NOTE — ED Provider Notes (Signed)
 " [place] EMERGENCY DEPARTMENT AT [facility] Provider Note   CSN: [id number] Arrival date & time: 05/20/24  9861     Patient presents with: Agitation   Shirley Woods is a 84 y.o. female.   The history is provided by the EMS personnel. The history is limited by the condition of the patient (Altered mental status).   She has history of hypertension, dementia and was sent in by ambulance because of agitation and refusing her medications.  Patient is belligerent and stating that she was just at a bingo game and then states that her mother will be worried because she is here so late.  She is asking us  why she was brought here.    Prior to Admission medications  Medication Sig Start Date End Date Taking? Authorizing Provider  acetaminophen  (TYLENOL ) 500 MG tablet Take 1 tablet (500 mg total) by mouth every 6 (six) hours as needed. 05/06/23   Charlyn Sora, MD  acetaminophen  (TYLENOL ) 500 MG tablet Take 2 tablets (1,000 mg total) by mouth every 6 (six) hours. 05/17/24   Augustus Almarie RAMAN, PA-C  amLODipine  (NORVASC ) 5 MG tablet TAKE ONE TABLET BY MOUTH ONCE A DAY 03/31/24   Caleen Dirks, MD  aspirin  325 MG tablet Take 1 tablet (325 mg total) by mouth 2 (two) times daily. 05/17/24   Augustus Almarie RAMAN, PA-C  Carboxymethylcellulose Sodium 1 % GEL Place 1 application into both eyes daily as needed (dry eyes).    [provider]  cholecalciferol  (CHOLECALCIFEROL ) 25 MCG tablet Take 2 tablets (2,000 Units total) by mouth daily. 05/17/24   Augustus Almarie RAMAN, PA-C  Cholecalciferol  (VITAMIN D ) 50 MCG (2000 UT) tablet Take 2,000 Units by mouth daily.    [provider]  Cyanocobalamin  (B-12 PO) Take 1 capsule by mouth every other day.    [provider]  docusate sodium  (COLACE) 100 MG capsule Take 1 capsule (100 mg total) by mouth 2 (two) times daily. 05/17/24   Augustus Almarie RAMAN, PA-C  donepezil  (ARICEPT ) 5 MG tablet Take 1 tablet (5 mg total) by mouth at  bedtime. 04/06/24   Amin, Saad, MD  donepezil  (ARICEPT ) 5 MG tablet Take 5 mg by mouth every evening. 04/07/24   [provider]  feeding supplement (ENSURE PLUS HIGH PROTEIN) LIQD Take 237 mLs by mouth 2 (two) times daily between meals. 05/17/24   Augustus Almarie RAMAN, PA-C  lamoTRIgine  (LAMICTAL ) 150 MG tablet Take 75 mg by mouth at bedtime.    [provider]  lamoTRIgine  (LAMICTAL ) 25 MG tablet Take 1 tablet (25 mg total) by mouth daily. 03/17/24   Amin, Saad, MD  lidocaine  (LIDODERM ) 5 % Place 1 patch onto the skin daily. Remove & Discard patch within 12 hours or as directed by MD 05/20/23   Caleen Dirks, MD  lidocaine  (LIDODERM ) 5 % Place 1 patch onto the skin every 12 (twelve) hours. Remove & Discard patch within 12 hours or as directed by MD 05/27/23 05/26/24  Caleen Dirks, MD  LORazepam  (ATIVAN ) 0.5 MG tablet Take 1 tablet (0.5 mg total) by mouth every 6 (six) hours as needed for anxiety (agitation). 05/17/24   Augustus Almarie RAMAN, PA-C  LORazepam  (ATIVAN ) 1 MG tablet Take 1.5 mg ( 1 1/2 tablet) PO at night 01/23/24   Amin, Saad, MD  Mouthwashes (MOUTH RINSE) LIQD solution 15 mLs by Mouth Rinse route as needed (for oral care). 05/17/24   Simaan, Elizabeth S, PA-C  Multiple Vitamin (MULTIVITAMIN WITH MINERALS) TABS tablet  Take 1 tablet by mouth daily.    [provider]  nitrofurantoin , macrocrystal-monohydrate, (MACROBID ) 100 MG capsule Take 1 capsule (100 mg total) by mouth 2 (two) times daily. 04/13/24   Amin, Saad, MD  OVER THE COUNTER MEDICATION Take 3 tablets by mouth 2 (two) times daily. algea cal    [provider]  OVER THE COUNTER MEDICATION Take 2 tablets by mouth every evening. strontium boost (at least 2 hours after the 2nd dose of algea cal)    [provider]  OVER THE COUNTER MEDICATION Place 1 patch onto the skin at bedtime. Zleep otc sleep patches    [provider]  Plant Sterols and Stanols (CHOLEST OFF PO) Take 2 tablets by mouth 2 (two)  times daily.    [provider]  polyethylene glycol (MIRALAX  / GLYCOLAX ) 17 g packet Take 17 g by mouth daily as needed (constipation). 05/17/24   Augustus Almarie RAMAN, PA-C  polyethylene glycol (MIRALAX  / GLYCOLAX ) 17 g packet Take 17 g by mouth daily. 05/17/24   Augustus Almarie RAMAN, PA-C  QUEtiapine  (SEROQUEL ) 100 MG tablet Take 1 tablet (100 mg total) by mouth at bedtime. 05/17/24   Simaan, Elizabeth S, PA-C  QUEtiapine  (SEROQUEL ) 50 MG tablet Take 1 tablet (50 mg total) by mouth 2 (two) times daily. 10/20/23   Amin, Saad, MD  QUEtiapine  (SEROQUEL ) 50 MG tablet Take 1 tablet (50 mg total) by mouth 2 (two) times daily at 10 am and 4 pm. 05/17/24   Augustus Almarie RAMAN, PA-C  sertraline  (ZOLOFT ) 100 MG tablet TAKE ONE AND ONE-HALF TABLETS (150MG ) BY MOUTH ONCE DAILY 03/22/24   Amin, Saad, MD  sertraline  (ZOLOFT ) 100 MG tablet Take 1 tablet (100 mg total) by mouth every evening. 05/17/24   Augustus Almarie RAMAN, PA-C  zinc  gluconate 50 MG tablet Take 50 mg by mouth daily.    [provider]  zinc  gluconate 50 MG tablet Take 50 mg by mouth daily.    [provider]    Allergies: Patient has no known allergies.    Review of Systems  Unable to perform ROS: Mental status change  All other systems reviewed and are negative.   Updated Vital Signs BP (!) 159/70   Pulse 87   Temp (!) 97.5 F (36.4 C)   Resp 17   SpO2 100%   Physical Exam Vitals and nursing note reviewed.   84 year old female, resting comfortably and in no acute distress. Vital signs are significant for elevated blood pressure. Oxygen saturation is 1`00%, which is normal. Head is normocephalic and atraumatic. PERRLA, EOMI.  Neck is nontender and supple. Lungs are clear without rales, wheezes, or rhonchi. Chest is nontender. Heart has regular rate and rhythm without murmur. Abdomen is soft, flat, nontender. Extremities have trace edema.  She has dressings over both ankles which are not removed. Skin is warm  and dry without rash. Neurologic: Agitated but able to hold a conversation.  Cranial nerves are intact.  She moves all extremities equally.  (all labs ordered are listed, but only abnormal results are displayed) Labs Reviewed  COMPREHENSIVE METABOLIC PANEL WITH GFR - Abnormal; Notable for the following components:      Result Value   Glucose, Bld 119 (*)    AST 47 (*)    ALT 47 (*)    Alkaline Phosphatase 225 (*)    All other components within normal limits  CBC WITH DIFFERENTIAL/PLATELET - Abnormal; Notable for the following components:   RBC  3.80 (*)    All other components within normal limits  URINALYSIS, W/ REFLEX TO CULTURE (INFECTION SUSPECTED) - Abnormal; Notable for the following components:   Color, Urine STRAW (*)    Leukocytes,Ua SMALL (*)    All other components within normal limits  ETHANOL      Procedures   Medications Ordered in the ED  acetaminophen  (TYLENOL ) tablet 650 mg (has no administration in time range)  ondansetron  (ZOFRAN ) tablet 4 mg (has no administration in time range)  alum & mag hydroxide-simeth (MAALOX/MYLANTA) 200-200-20 MG/5ML suspension 30 mL (has no administration in time range)  aspirin  tablet 325 mg (has no administration in time range)  amLODipine  (NORVASC ) tablet 5 mg (has no administration in time range)  donepezil  (ARICEPT ) tablet 5 mg (has no administration in time range)  sertraline  (ZOLOFT ) tablet 100 mg (has no administration in time range)  QUEtiapine  (SEROQUEL ) tablet 50 mg (has no administration in time range)  QUEtiapine  (SEROQUEL ) tablet 100 mg (has no administration in time range)  docusate sodium  (COLACE) capsule 100 mg (has no administration in time range)  polyethylene glycol (MIRALAX  / GLYCOLAX ) packet 17 g (has no administration in time range)  cyanocobalamin  (VITAMIN B12) tablet 500 mcg (has no administration in time range)  lamoTRIgine  (LAMICTAL ) tablet 75 mg (has no administration in time range)  lamoTRIgine   (LAMICTAL ) tablet 25 mg (has no administration in time range)  cholecalciferol  (VITAMIN D3) 25 MCG (1000 UNIT) tablet 2,000 Units (has no administration in time range)  feeding supplement (ENSURE PLUS HIGH PROTEIN) liquid 237 mL (has no administration in time range)  LORazepam  (ATIVAN ) tablet 0.5 mg (has no administration in time range)  haloperidol  lactate (HALDOL ) injection 5 mg (has no administration in time range)  zinc  sulfate (50mg  elemental zinc ) capsule 220 mg (has no administration in time range)  haloperidol  lactate (HALDOL ) injection 5 mg (5 mg Intramuscular Given 05/20/24 0254)                                    Medical Decision Making Amount and/or Complexity of Data Reviewed Labs: ordered. Radiology: ordered.  Risk OTC drugs. Prescription drug management.   Agitation which is most likely exacerbation of baseline dementia.  I have ordered workup to rule out occult infection, electrolyte disturbance.  I have reviewed her past records and note hospitalization for fall on 04/22/2024 and had open reduction and internal fixation of right fibula, third metatarsal, cuboid fracture with subtalar subluxation and also left calcaneus fracture and talonavicular joint subluxation and navicular fracture treated with both open reduction and internal fixation.  She has had 2 recent ED visits for altered mental status and confusion-most recently on 04/17/2024.  Because of her degree of agitation, I have ordered intramuscular haloperidol  for sedation.  Follow morning haloperidol , patient was calm and resting.  I reviewed her laboratory tests, and my interpretation is normal CBC, elevated random glucose level and minimal elevation of transaminases, mild elevation of alkaline phosphatase which is new compared with 04/22/2024.  I am ordering psychiatric consultation for medication recommendations.  CRITICAL CARE Performed by: Alm Lias Total critical care time: 40 minutes Critical care time was  exclusive of separately billable procedures and treating other patients. Critical care was necessary to treat or prevent imminent or life-threatening deterioration. Critical care was time spent personally by me on the following activities: development of treatment plan with patient and/or surrogate as well as nursing, discussions  with consultants, evaluation of patient's response to treatment, examination of patient, obtaining history from patient or surrogate, ordering and performing treatments and interventions, ordering and review of laboratory studies, ordering and review of radiographic studies, pulse oximetry and re-evaluation of patient's condition.     Final diagnoses:  Agitation  Elevated random blood glucose level  Elevated transaminase level  Elevated alkaline phosphatase level    ED Discharge Orders     None          Raford Lenis, MD 05/20/24 [phone]  "

## 2024-05-20 NOTE — ED Triage Notes (Signed)
 Pt BIB [facility] from facility.Pt agitated and refusing all of her night time medications. Pt Hx dementia Aox2. Pt believes everyone wants to murder her

## 2024-05-20 NOTE — Consult Note (Addendum)
 [facility] Psychiatric Consult Follow up  Patient Name: .Shirley Woods  MRN: [id number]  DOB: 02/12/1941  Consult Order details:  Orders (From admission, onward)     Start     Ordered   05/20/24 0622  CONSULT TO CALL ACT TEAM       Ordering Provider: Raford Lenis, MD  Provider:  (Not yet assigned)  Question:  Reason for Consult?  Answer:  Psych consult   05/20/24 0622             Mode of Visit: In person    Psychiatry Consult Evaluation  Service Date: May 21, 2024 LOS:  LOS: 0 days  Chief Complaint agitation and refusing medication   Primary Psychiatric Diagnoses  Dementia with behavioral disturbances   Assessment  Shirley Woods is a 84 y.o. female admitted: Presented to the ED on 05/20/2024  1:38 AM for agitation and refusing medication. Patient has no psychiatric history and has a medical history of neurocognitive disorder, dementia and hypertension.  Per chart review, patient was brought to the emergency department on 05/20/2024 from [facility] skilled nursing facility for complaints of agitation, refusing nighttime medications and believing everyone wants to murder her. In addition, it was noted that the patient jumped out of the third story window while at [facility] memory care unit and per chart review she was hospitalized at [facility] on 04/22/2024.    Patient was seen by psychiatry on 05/20/2024 and was recommended for overnight observation with consideration for discharge on 05/21/2024. Patient has not received intramuscular agitation medications in the past 24 hours and it appears that she was administered Haldol  5 mg IM on arrival on 05/20/2024 at 025 4 AM. Patient has not exhibited severe agitation in the past 24 hours and has not demonstrated aggressive, psychotic or self-harm behaviors. Per MAR, patient has been medication compliant during encounter.  On reevaluation on 05/21/24, patient is alert and oriented to self. She is disoriented to place, time  and situation. Patient is a poor historian due to history of dementia and confusion. Thought content is negative for SI/HI/AVH. Objectively, no signs of acute psychosis including no paranoia or delusional ideations on exam. Her speech is coherent. No signs of severe depression or anxiety on exam. Her affect is bright and appropriate on exam. She has fair eye contact. She is calm and cooperative and does not appear to be in acute distress.  Diagnoses:  Active Hospital problems: Principal Problem:   Dementia with behavioral disturbance (HCC)    Plan   ## Psychiatric Medication Recommendations:  Continue current psychotropic regimen as followed: Seroquel  100 mg QHS Seroquel  50 mg BID Zoloft  100 mg daily Lamictal  150 mg QHS Ativan  0.5 mg every 6 hours as needed for agitation, monitor for falls and worsening confusion  ## Medical Decision Making Capacity: Patient has a guardian and has thus been adjudicated incompetent; please involve patients guardian in medical decision making  ## Further Work-up:  -- No further workup needed at this time EKG, While pt on Qtc prolonging medications, please monitor & replete K+ to 4 and Mg2+ to 2, or UDS -- most recent EKG on 05/20/2024 had QtC of 378 -- Pertinent labwork reviewed earlier this admission includes: CBC, EKG, UDS   ## Disposition:--Patient is psychiatrically cleared. Patient does not appear to be at imminent risk of dangerousness to self or others or acutely psychotic at this time. While future psychiatric events cannot be accurately predicted, the patient does not necessitate nor desire further  acute inpatient psychiatric care at this time. The patient does not meet [facility]  involuntary commitment criteria at this time. Patient to return back to [facility] skilled nursing facility/memory care with safety precaution and supervision due to history of dementia. Patient has 24 hour sitter coverage at [name]. Patient recommended to follow up  with outpatient psychiatry and neurology for continuous evaluation and medication management.   ## Behavioral / Environmental: -Delirium Precautions: Delirium Interventions for Nursing and Staff: - RN to open blinds every AM. - To Bedside: Glasses, hearing aide, and pt's own shoes. Make available to patients. when possible and encourage use. - Encourage po fluids when appropriate, keep fluids within reach. - OOB to chair with meals. - Passive ROM exercises to all extremities with AM & PM care. - RN to assess orientation to person, time and place QAM and PRN. - Recommend extended visitation hours with familiar family/friends as feasible. - Staff to minimize disturbances at night. Turn off television when pt asleep or when not in use., Difficult Patient (SELECT OPTIONS FROM BELOW), To minimize splitting of staff, assign one staff person to communicate all information from the team when feasible., or Utilize compassion and acknowledge the patient's experiences while setting clear and realistic expectations for care.    ## Safety and Observation Level:  - Based on my clinical evaluation, I estimate the patient to be at low risk of self harm in the current setting. - At this time, we recommend  1:1 Observation. This decision is based on my review of the chart including patient's history and current presentation, interview of the patient, mental status examination, and consideration of suicide risk including evaluating suicidal ideation, plan, intent, suicidal or self-harm behaviors, risk factors, and protective factors. This judgment is based on our ability to directly address suicide risk, implement suicide prevention strategies, and develop a safety plan while the patient is in the clinical setting. Please contact our team if there is a concern that risk level has changed.  CSSR Risk Category:C-SSRS RISK CATEGORY: No Risk  Suicide Risk Assessment: Patient has following modifiable risk factors for suicide:  active mental illness (to encompass adhd, tbi, mania, psychosis, trauma reaction), which we are addressing by recommending outpatient psychiatry and neurology for long-term treatment of neurocognitive disorder. Patient has following non-modifiable or demographic risk factors for suicide: none Patient has the following protective factors against suicide: Access to outpatient mental health care and Supportive family  Thank you for this consult request. Recommendations have been communicated to the primary team. We sign off at this time.   Teresa Wyline CROME, NP       History of Present Illness  Relevant Aspects of Hospital ED Course: Admitted on 05/20/2024 for agitation and refusing medication.   Patient Report:  Patient seen and evaluated face-to-face by this provider. Patient is alert and oriented to self. She is disoriented to place, time and situation. Patient states that she is in the hospital for treatment for her feet after she torn her feet on wires a few months ago. She states that she lives in [place], [place]  with her husband and 2 kids, Oneil and Bernardino in a nice neighborhood with 2 dogs. She is not aware that she is currently residing at [facility] skilled nursing facility with her husband.  She denies psychiatric symptoms or concerns. She states that she has not been suicidal in the past years. She denies active SI.  She denies past suicide attempts. She denies homicidal thoughts and states that she would  never hurt anyone. She denies auditory or visual hallucinations. She denies paranoia. She denies symptoms of depression and anxiety and states that her mood is pretty good. She states that she is excited about getting home today. She denies difficulty eating or sleeping. Per chart review, she has been medication compliant while in the emergency department. She denies medication side effects. No signs of involuntary movements or muscle spasms on exam.  Psych ROS:   Depression:No Anxiety:  No Mania (lifetime and current): No Psychosis:No  Collateral information:   On 05/21/24: I spoke with Tylene Christoph via telephone at the time she was speaking with Chesley, [facility] to answer questions related to medication recommendations for starting LAI. It has been recommended to consider d/c Seroquel  and start Risperdal x 2 weeks for tolerability and consider adding LAI in an outpatient setting.  Per Chesley Holt, [facility] on 05/21/24: [facility] called Sheri Mccartt, pts daughter in law to report that pt has been psych cleared and can return to her facility. Pts daughter in law spoke with the provider regarding medication adjustments that would need to be made in order for pt to receive a long acting injectable medication on a outpatient basis. Pts daughter in law was informed that [facility] has reached out and left a message for the Admissions Coordinator at pts facility to inform them that pt is psych cleared and can return. Pts daughter in law asked to be notified of when pt is scheduled to return in order to arrange a private sitter which requires a 24 hour notice. [facility] agreed to contact daughter in law to inform her of when pt will return to her facility.    Chesley Holt, [facility].  Per chart review, on 05/20/24: Dr. Cheyenne (Medical Director, [facility]): Reported that patient previously jumped out of a third-story window after breaking the window and tying sheets together in an attempt to lower herself, resulting in significant injury. Since returning from hospitalization, [facility] has adjusted psychiatric medications twice without sustained improvement. Patient becomes paranoid, aggressive, and refuses all oral medications during episodes. Facility is unable to administer PRN IM medications and currently maintains a 24-hour sitter as patient resides on the independent living side. Dr. Cheyenne expressed concern that the patient requires a higher level of care, likely memory care,  but reports family resistance to this recommendation. She also expressed concern that overstimulation from frequent family visits worsens patients agitation. Dr. Cheyenne recommends initiation of a long-acting injectable antipsychotic, stating that if LAI cannot be administered, the facility may not be able to safely manage the patient.  Per chart review, on 05/20/24: Daughter-in-law Irby): Informed of facility concerns and recommendation for a long-acting injection. Discussion included risks and benefits of LAI use in dementia, with explanation that oral risperidone trial would be required prior to initiation of risperidone LAI. Also discussed potential need for memory care placement or alternate facility with higher level of care. Tylene stated she needs to discuss options with family before making a decision.  Review of Systems  Respiratory: Negative.    Cardiovascular: Negative.   Psychiatric/Behavioral:  Positive for memory loss.      Psychiatric and Social History  Psychiatric History:  Information collected from patient's daughter-in-law, patient and EMR.  Prev Dx/Sx: History of Dementia Current Psych Provider: Yes, @ [facility]  Previous Med Trials: Yes Therapy: No  Prior Psych Hospitalization: No Prior Self Harm: No Prior Violence: Yes, aggression  Family Psych History: No Family Hx suicide: No  Social History:  Developmental Hx: Normal  Educational Hx: Patient graduated from high school Occupational Hx: Retired Armed Forces Operational Officer Hx: NO Living Situation: Resides at [name] nursing facility  Spiritual Hx: Yes Access to weapons/lethal means: No  Substance History Patient and daughter-in-law deny any past or current substance abuse or alcohol history.  Exam Findings  Physical Exam:  Vital Signs:  Temp:  [98.2 F (36.8 C)-98.6 F (37 C)] 98.2 F (36.8 C) (01/09 0322) Pulse Rate:  [78-95] 94 (01/09 0322) Resp:  [16-19] 18 (01/09 0322) BP: (131-172)/(65-87) 131/65 (01/09  0322) SpO2:  [96 %-99 %] 99 % (01/09 0322) Blood pressure 131/65, pulse 94, temperature 98.2 F (36.8 C), resp. rate 18, SpO2 99%. There is no height or weight on file to calculate BMI.  Physical Exam Vitals reviewed.  Cardiovascular:     Rate and Rhythm: Normal rate.  Pulmonary:     Effort: Pulmonary effort is normal.  Neurological:     Mental Status: She is alert. Mental status is at baseline.  Psychiatric:        Mood and Affect: Mood is anxious.        Speech: Speech normal.        Cognition and Memory: Cognition is impaired.        Judgment: Judgment is impulsive.     Mental Status Exam: General Appearance: Calm, cooperative  Orientation:  Oriented to self  Memory:  Immediate;   Fair  Concentration:  Concentration: Fair  Recall:  Poor  Attention  Fair  Eye Contact:  Good  Speech:  Clear and Coherent  Language:  Good  Volume:  Normal  Mood: Anxious  Affect:  Appropriate  Thought Process:  Linear  Thought Content:  Impaired, no psychosis   Suicidal Thoughts:  No  Homicidal Thoughts:  No  Judgement:  Impaired  Insight:  Impaired   Psychomotor Activity:  Normal  Akathisia:  No  Fund of Knowledge:  Fair      Assets:  Manufacturing Systems Engineer Desire for Improvement Financial Resources/Insurance Housing Intimacy Social Support  Cognition:  Impaired,  Moderate  ADL's:  Intact  AIMS (if indicated):   N/A     Other History   These have been pulled in through the EMR, reviewed, and updated if appropriate.  Family History:  The patient's Family history is unknown by patient.  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)    Short term memory loss   Dementia (HCC)    Family history of adverse reaction to anesthesia    Hypertension     Surgical History: Past Surgical History:  Procedure Laterality  Date   ABDOMINAL HYSTERECTOMY     APPENDECTOMY     IRRIGATION AND DEBRIDEMENT FOOT Right 04/23/2024   Procedure: IRRIGATION AND DEBRIDEMENT FOOT;  Surgeon: Kendal Franky SQUIBB, MD;  Location: [facility] OR;  Service: Orthopedics;  Laterality: Right;   LAPAROSCOPIC APPENDECTOMY N/A 07/29/2017   Procedure: APPENDECTOMY LAPAROSCOPIC;  Surgeon: Sebastian Moles, MD;  Location: [facility] OR;  Service: General;  Laterality: N/A;   ORIF CALCANEOUS FRACTURE Right 04/23/2024   Procedure: OPEN REDUCTION INTERNAL FIXATION (ORIF) CALCANEOUS FRACTURE;  Surgeon: Kendal Franky SQUIBB, MD;  Location: [facility] OR;  Service: Orthopedics;  Laterality: Right;   PERCUTANEOUS PINNING Left 04/23/2024   Procedure: PINNING, EXTREMITY, PERCUTANEOUS;  Surgeon: Kendal Franky SQUIBB, MD;  Location: [facility] OR;  Service: Orthopedics;  Laterality: Left;  REVERSE SHOULDER ARTHROPLASTY Right 11/28/2020   Procedure: REVERSE SHOULDER ARTHROPLASTY;  Surgeon: Josefina Chew, MD;  Location: [facility] ORS;  Service: Orthopedics;  Laterality: Right;     Medications:  Current Medications[1]  Allergies: Allergies[2]  Janziel Hockett L, NP     [1]  Current Facility-Administered Medications:    acetaminophen  (TYLENOL ) tablet 650 mg, 650 mg, Oral, Q4H PRN, Raford Lenis, MD   alum & mag hydroxide-simeth (MAALOX/MYLANTA) 200-200-20 MG/5ML suspension 30 mL, 30 mL, Oral, Q6H PRN, Raford Lenis, MD   amLODipine  (NORVASC ) tablet 2.5 mg, 2.5 mg, Oral, Daily, Carolee Rosaline DASEN, RPH, 2.5 mg at 05/20/24 1027   aspirin  tablet 325 mg, 325 mg, Oral, BID, Raford Lenis, MD, 325 mg at 05/20/24 2107   cholecalciferol  (VITAMIN D3) 25 MCG (1000 UNIT) tablet 2,000 Units, 2,000 Units, Oral, Daily, Raford Lenis, MD, 2,000 Units at 05/20/24 1026   docusate sodium  (COLACE) capsule 100 mg, 100 mg, Oral, BID, Raford Lenis, MD, 100 mg at 05/20/24 2106   donepezil  (ARICEPT ) tablet 5 mg, 5 mg, Oral, QHS, Raford Lenis, MD, 5 mg at 05/20/24 2107   haloperidol  lactate (HALDOL ) injection 5 mg, 5 mg,  Intramuscular, Q6H PRN, Raford Lenis, MD   lamoTRIgine  (LAMICTAL ) tablet 150 mg, 150 mg, Oral, QHS, BellRosaline DASEN, RPH, 150 mg at 05/20/24 2107   LORazepam  (ATIVAN ) tablet 0.5 mg, 0.5 mg, Oral, Q6H PRN, Raford Lenis, MD, 0.5 mg at 05/20/24 2106   ondansetron  (ZOFRAN ) tablet 4 mg, 4 mg, Oral, Q8H PRN, Raford Lenis, MD   polyethylene glycol (MIRALAX  / GLYCOLAX ) packet 17 g, 17 g, Oral, Daily, Raford Lenis, MD, 17 g at 05/20/24 1113   QUEtiapine  (SEROQUEL ) tablet 100 mg, 100 mg, Oral, QHS, Raford Lenis, MD, 100 mg at 05/20/24 2107   QUEtiapine  (SEROQUEL ) tablet 50 mg, 50 mg, Oral, BID, Raford Lenis, MD, 50 mg at 05/20/24 1600   sertraline  (ZOLOFT ) tablet 100 mg, 100 mg, Oral, QPM, Raford Lenis, MD, 100 mg at 05/20/24 1747   zinc  sulfate (50mg  elemental zinc ) capsule 220 mg, 220 mg, Oral, Daily, Carolee Rosaline DASEN, RPH, 220 mg at 05/20/24 1027  Current Outpatient Medications:    acetaminophen  (TYLENOL ) 500 MG tablet, Take 2 tablets (1,000 mg total) by mouth every 6 (six) hours., Disp: , Rfl:    amLODipine  (NORVASC ) 5 MG tablet, TAKE ONE TABLET BY MOUTH ONCE A DAY (Patient taking differently: Take 2.5 mg by mouth daily.), Disp: 90 tablet, Rfl: 0   aspirin  325 MG tablet, Take 1 tablet (325 mg total) by mouth 2 (two) times daily., Disp: , Rfl:    cholecalciferol  (CHOLECALCIFEROL ) 25 MCG tablet, Take 2 tablets (2,000 Units total) by mouth daily., Disp: , Rfl:    docusate sodium  (COLACE) 100 MG capsule, Take 1 capsule (100 mg total) by mouth 2 (two) times daily., Disp: , Rfl:    donepezil  (ARICEPT ) 5 MG tablet, Take 1 tablet (5 mg total) by mouth at bedtime., Disp: 30 tablet, Rfl: 3   lamoTRIgine  (LAMICTAL ) 150 MG tablet, Take 150 mg by mouth at bedtime., Disp: , Rfl:    LORazepam  (ATIVAN ) 0.5 MG tablet, Take 1 tablet (0.5 mg total) by mouth every 6 (six) hours as needed for anxiety (agitation). (Patient taking differently: Take 0.5 mg by mouth every 6 (six) hours as needed for anxiety (agitation). For 14  days starting 05/18/24), Disp: 10 tablet, Rfl: 0   polyethylene glycol (MIRALAX  / GLYCOLAX ) 17 g packet, Take 17 g by mouth daily. (Patient taking differently: Take 17 g by  mouth daily as needed for mild constipation.), Disp: , Rfl:    QUEtiapine  (SEROQUEL ) 100 MG tablet, Take 1 tablet (100 mg total) by mouth at bedtime., Disp: , Rfl:    QUEtiapine  (SEROQUEL ) 50 MG tablet, Take 1 tablet (50 mg total) by mouth 2 (two) times daily at 10 am and 4 pm., Disp: , Rfl:    sertraline  (ZOLOFT ) 100 MG tablet, Take 1 tablet (100 mg total) by mouth every evening., Disp: , Rfl:    UNABLE TO FIND, Take 120 mLs by mouth in the morning and at bedtime. Med Name: Med Plus 2.0, Disp: , Rfl:    zinc  gluconate 50 MG tablet, Take 50 mg by mouth daily., Disp: , Rfl:    Carboxymethylcellulose Sodium 1 % GEL, Place 1 application into both eyes daily as needed (dry eyes)., Disp: , Rfl:    Cyanocobalamin  (B-12 PO), Take 1 capsule by mouth every other day. (Patient not taking: Reported on 05/20/2024), Disp: , Rfl:    feeding supplement (ENSURE PLUS HIGH PROTEIN) LIQD, Take 237 mLs by mouth 2 (two) times daily between meals. (Patient not taking: Reported on 05/20/2024), Disp: , Rfl:    lamoTRIgine  (LAMICTAL ) 25 MG tablet, Take 1 tablet (25 mg total) by mouth daily. (Patient not taking: Reported on 05/20/2024), Disp: 60 tablet, Rfl: 2   lidocaine  (LIDODERM ) 5 %, Place 1 patch onto the skin daily. Remove & Discard patch within 12 hours or as directed by MD (Patient not taking: Reported on 05/20/2024), Disp: 30 patch, Rfl: 0   lidocaine  (LIDODERM ) 5 %, Place 1 patch onto the skin every 12 (twelve) hours. Remove & Discard patch within 12 hours or as directed by MD (Patient not taking: Reported on 05/20/2024), Disp: 10 patch, Rfl: 0   LORazepam  (ATIVAN ) 1 MG tablet, Take 1.5 mg ( 1 1/2 tablet) PO at night (Patient not taking: Reported on 05/20/2024), Disp: 45 tablet, Rfl: 4   Multiple Vitamin (MULTIVITAMIN WITH MINERALS) TABS tablet, Take 1 tablet  by mouth daily. (Patient not taking: Reported on 05/20/2024), Disp: , Rfl:  [2] No Known Allergies

## 2024-05-20 NOTE — ED Provider Notes (Signed)
 Family was at bedside today.  At bedside.  Patient agreeable, calm.  She is very pleasant, only has issues in the evening.  Her symptoms seem consistent with sundowning.  She is currently in a memory care unit.  Psychiatry seen the patient and ask.  Do not anticipate any significant medication changes, patient should be able to return to her facility.   Mannie Pac T, DO 05/20/24 1500

## 2024-05-20 NOTE — ED Notes (Signed)
 Pt sleeping will get vitals when pt wakes up.

## 2024-05-20 NOTE — ED Notes (Signed)
 I changed her out into scrubs. Her clothes were placed in a personal belongings bag and placed at her bedside.

## 2024-05-20 NOTE — ED Notes (Signed)
 Patient becoming increasingly agitated that she has not seen her husband. Attempted to call husband with no answer. Called DIL who spoke to patient.

## 2024-05-20 NOTE — ED Notes (Addendum)
 [facility] responded to a message from Brittany from [facility], pts facility. Per Brittany before entering their the [facility] rehab facility on 05/19/23, pt had been in the [place] memory care unit where she jumped out of a third story window. Pt sustained foot injuries and was brought to [facility] hospital and was there for about 3 1/2 weeks. Pt was then discharged to [facility] rehab facility. Pt has been seen at the [facility] facility by their psych provider when she first arrived to rehab on 1/6, though Brittany was unsure if pts medications have been adjusted. Brittany agreed to forward pts current medication list. The facility reports that the family has observed pts dementia worsening. They describe her behavior as manic. Pt lives in her current facility with her husband.   Pts huisband is POA and her daughter Tylene is daughter is LG/POA. Their psych provider requested to speak to our provider after pt has been assessed her.[facility] explained to Brittany that given pts dementia diagnosis it is unlikely that she would be admitted to an inpatient psychiatric facility. [facility] explained in the event she is not admitted she will be treated and stabilized in the ED and discharged back to the facility. Brittany said that she understood.   [facility] received a copy of pts current medication list and uploaded into pts chart. Still awaiting the facility's provider name and contact information.   Chesley Holt, [facility]  05/20/24

## 2024-05-21 ENCOUNTER — Emergency Department (HOSPITAL_COMMUNITY)

## 2024-05-21 ENCOUNTER — Telehealth: Payer: Self-pay | Admitting: Physician Assistant

## 2024-05-21 NOTE — ED Notes (Signed)
 [facility] called Sheri Whiteford, pts daughter in law and LG to inform her that pt is psych cleared and can return back to the facility. The provider spoke with the LG about having an outpatient provider work with the pt to have her started on a long acting injectable. Sheri said that she would be able to have the private sitter in place at 3pm. [facility] agreed to have pt transported to have her arrive at about 3pm. [facility] expressed the Medical Director's concerns over the family, particularly pts husband being on the same page about pts safety plan. Tylene said that the family had discussed the situation and pts husband understands that pt cannot be taken off of the unit. Pts LG was appreciative of the information.    Chesley Holt, [facility]  05/21/24

## 2024-05-21 NOTE — Telephone Encounter (Signed)
 Lamar ROCKFORD (Spouse) called in stating that he is wanting to speak with the provider regarding Pat. He stated that she is currently at [facility], and her neurological issues are worse.   PH: [phone]

## 2024-05-21 NOTE — ED Notes (Addendum)
 [facility] called Laymon Daring, Admissions Coordinator at [facility] [phone]), pts facility to inform her that pt is being psych cleared and can return to the facility. Per the provider, if pts psych provider at her facility would like to start an long acting injectable medication adjustments would need to be made 2 weeks before and the injectable would need to be done on an outpatient basis. [facility] left a HIPAA compliant message to return the call.   [facility] called again to the administrative office of the [facility] facility to inform them that pts is psych cleared and can return. [facility] was transferred to an extension with no recorded message. [facility] left a HIPAA complaint message to return the call.   Chesley Holt, [facility]  05/21/24

## 2024-05-21 NOTE — ED Notes (Signed)
 [facility] received a call from Dr. Laurine, the medical Director from pts facility. [facility] informed Dr. Laurine that is psych cleared and can return to the facility. The Dr. Expressed concerns over pt not being able to receive a long acting injectable while in the ED and that pts behaviors will return when she comes back to the facility. The provider spoke with Dr. Reinhold regarding pt being psych cleared and recommendations to have pt started on a long acting injectable through and outpatient provider.   Dr. Laurine expressed frustration over pts family not being compliant with pts safety plan and that pts husband had taken the pt off of the unit. Dr. Laurine agreed to have the patient return but still expressed concerns over pts behaviors. [facility] agreed to reach out to pts LG, pts daughter in law Tylene to coordinate putting in place pts private sitter for when pt arrives back to the facility as well as express Dr. Karyle concerns over the family not being on the same page around pts safety plan.   [facility] received a call again from the facility to inquire about when pt would be returning. Per a conversation with pts LG they will be able to have the private sitter in place for 3pm today. [facility] agreed to have pt transported via [facility] to arrive at about 3pm.   Chesley Holt, [facility]  05/21/24

## 2024-05-21 NOTE — ED Notes (Addendum)
 [facility] called Sheri Snodgrass, pts daughter in law to report that pt has been psych cleared and can return to her facility. Pts daughter in law spoke with the provider regarding medication adjustments that would need to be made in order for pt to receive a long acting injectable medication on a outpatient basis. Pts daughter in law was informed that [facility] has reached out and left a message for the Admissions Coordinator at pts facility to inform them that pt is psych cleared and can return. Pts daughter in law asked to be notified of when pt is scheduled to return in order to arrange a engineer, site. [facility] agreed to contact daughter in law to inform her of when pt will return to her facility.   Chesley Holt, [facility]  05/21/24

## 2024-05-21 NOTE — Discharge Instructions (Signed)
 You were seen in the emergency department for your agitation. This is likely related to your dementia. You can follow up with your primary doctor for further management. You can return to the emergency department for any new or concerning symptoms.

## 2024-05-21 NOTE — ED Notes (Signed)
 [facility] called

## 2024-05-21 NOTE — ED Provider Notes (Addendum)
 Emergency Medicine Observation Re-evaluation Note  Shirley Woods is a 84 y.o. female, seen on rounds today.  Pt initially presented to the ED for complaints of Agitation Currently, the patient is asleep, no new concerns.  Physical Exam  BP 131/65   Pulse 94   Temp 98.2 F (36.8 C)   Resp 18   SpO2 99%  Physical Exam General: Asleep, no acute distress Cardiac: Regular rate Lungs: No increased WOB Psych: Calm, asleep  ED Course / MDM  EKG:EKG Interpretation Date/Time:  Thursday May 20 2024 07:26:53 EST Ventricular Rate:  76 PR Interval:    QRS Duration:  78 QT Interval:  336 QTC Calculation: 378 R Axis:   166  Text Interpretation:  Suspect arm lead reversal, interpretation assumes no reversal Atrial flutter with variable A-V block Right axis deviation Abnormal ECG When compared with ECG of 04/17/2024, Arm lead reversal is now present Confirmed by Raford Lenis ([id number]) on 05/21/2024 1:04:14 AM  I have reviewed the labs performed to date as well as medications administered while in observation.  Recent changes in the last 24 hours include patient medically cleared. Evaluated by psych and recommended overnight observation before final recommendations on disposition.  Plan  Current plan is for psych reassessment this AM for dispo.  1:08 PM Patient psych cleared. Plan for DC back to facility.     Ellouise Fine K, DO 05/21/24 1308

## 2024-05-24 ENCOUNTER — Ambulatory Visit: Admitting: Physician Assistant

## 2024-05-24 ENCOUNTER — Ambulatory Visit

## 2024-06-05 ENCOUNTER — Emergency Department (HOSPITAL_COMMUNITY)

## 2024-06-05 ENCOUNTER — Emergency Department (HOSPITAL_COMMUNITY)
Admission: EM | Admit: 2024-06-05 | Discharge: 2024-06-11 | Disposition: A | Discharge status: Home / Self Care | Attending: Emergency Medicine | Admitting: Emergency Medicine

## 2024-06-05 DIAGNOSIS — Z7982 Long term (current) use of aspirin: Secondary | ICD-10-CM | POA: Diagnosis not present

## 2024-06-05 DIAGNOSIS — S299XXA Unspecified injury of thorax, initial encounter: Secondary | ICD-10-CM | POA: Diagnosis present

## 2024-06-05 DIAGNOSIS — I10 Essential (primary) hypertension: Secondary | ICD-10-CM | POA: Insufficient documentation

## 2024-06-05 DIAGNOSIS — X58XXXA Exposure to other specified factors, initial encounter: Secondary | ICD-10-CM | POA: Diagnosis not present

## 2024-06-05 DIAGNOSIS — U071 COVID-19: Secondary | ICD-10-CM | POA: Diagnosis not present

## 2024-06-05 DIAGNOSIS — F03911 Unspecified dementia, unspecified severity, with agitation: Secondary | ICD-10-CM | POA: Insufficient documentation

## 2024-06-05 DIAGNOSIS — S2242XA Multiple fractures of ribs, left side, initial encounter for closed fracture: Secondary | ICD-10-CM | POA: Diagnosis not present

## 2024-06-05 DIAGNOSIS — Z96611 Presence of right artificial shoulder joint: Secondary | ICD-10-CM | POA: Diagnosis not present

## 2024-06-05 LAB — CBC WITH DIFFERENTIAL/PLATELET
Abs Immature Granulocytes: 0.04 10*3/uL (ref 0.00–0.07)
Basophils Absolute: 0 10*3/uL (ref 0.0–0.1)
Basophils Relative: 0 %
Eosinophils Absolute: 0 10*3/uL (ref 0.0–0.5)
Eosinophils Relative: 0 %
HCT: 40.1 % (ref 36.0–46.0)
Hemoglobin: 12.9 g/dL (ref 12.0–15.0)
Immature Granulocytes: 1 %
Lymphocytes Relative: 10 %
Lymphs Abs: 0.7 10*3/uL (ref 0.7–4.0)
MCH: 30.4 pg (ref 26.0–34.0)
MCHC: 32.2 g/dL (ref 30.0–36.0)
MCV: 94.6 fL (ref 80.0–100.0)
Monocytes Absolute: 0.4 10*3/uL (ref 0.1–1.0)
Monocytes Relative: 6 %
Neutro Abs: 5.9 10*3/uL (ref 1.7–7.7)
Neutrophils Relative %: 83 %
Platelets: 356 10*3/uL (ref 150–400)
RBC: 4.24 MIL/uL (ref 3.87–5.11)
RDW: 13.8 % (ref 11.5–15.5)
WBC: 7.2 10*3/uL (ref 4.0–10.5)
nRBC: 0 % (ref 0.0–0.2)

## 2024-06-05 LAB — BASIC METABOLIC PANEL WITH GFR
Anion gap: 13 (ref 5–15)
BUN: 7 mg/dL — ABNORMAL LOW (ref 8–23)
CO2: 25 mmol/L (ref 22–32)
Calcium: 10.5 mg/dL — ABNORMAL HIGH (ref 8.9–10.3)
Chloride: 103 mmol/L (ref 98–111)
Creatinine, Ser: 0.7 mg/dL (ref 0.44–1.00)
GFR, Estimated: >60 mL/min
Glucose, Bld: 120 mg/dL — ABNORMAL HIGH (ref 70–99)
Potassium: 4.2 mmol/L (ref 3.5–5.1)
Sodium: 141 mmol/L (ref 135–145)

## 2024-06-05 LAB — URINALYSIS, W/ REFLEX TO CULTURE (INFECTION SUSPECTED)
Bacteria, UA: NONE SEEN
Bilirubin Urine: NEGATIVE
Glucose, UA: NEGATIVE mg/dL
Hgb urine dipstick: NEGATIVE
Ketones, ur: 5 mg/dL — AB
Nitrite: NEGATIVE
Protein, ur: NEGATIVE mg/dL
Specific Gravity, Urine: 1.005 (ref 1.005–1.030)
pH: 7 (ref 5.0–8.0)

## 2024-06-05 LAB — RESP PANEL BY RT-PCR (RSV, FLU A&B, COVID)  RVPGX2
Influenza A by PCR: NEGATIVE
Influenza B by PCR: NEGATIVE
Resp Syncytial Virus by PCR: NEGATIVE
SARS Coronavirus 2 by RT PCR: POSITIVE — AB

## 2024-06-05 MED ORDER — DONEPEZIL HCL 5 MG PO TABS
5.0000 mg | ORAL_TABLET | Freq: Every day | ORAL | Status: DC
  Administered 2024-06-06 – 2024-06-10 (×4): 5 mg via ORAL
  Filled 2024-06-05 (×5): qty 1

## 2024-06-05 MED ORDER — LAMOTRIGINE 25 MG PO TABS: 25.0000 mg | ORAL_TABLET | Freq: Every day | ORAL | Status: DC

## 2024-06-05 MED ORDER — ENSURE PLUS HIGH PROTEIN PO LIQD
237.0000 mL | Freq: Two times a day (BID) | ORAL | Status: DC
  Administered 2024-06-08 – 2024-06-09 (×3): 237 mL via ORAL
  Filled 2024-06-05 (×10): qty 237

## 2024-06-05 MED ORDER — LAMOTRIGINE 25 MG PO TABS
150.0000 mg | ORAL_TABLET | Freq: Every day | ORAL | Status: DC
  Administered 2024-06-06 – 2024-06-10 (×4): 150 mg via ORAL
  Filled 2024-06-05 (×5): qty 2

## 2024-06-05 MED ORDER — STERILE WATER FOR INJECTION IJ SOLN
INTRAMUSCULAR | Status: AC
  Administered 2024-06-05: 2.1 mL via INTRAMUSCULAR
  Filled 2024-06-05: qty 10

## 2024-06-05 MED ORDER — CYANOCOBALAMIN 500 MCG PO TABS
500.0000 ug | ORAL_TABLET | ORAL | Status: DC
  Administered 2024-06-06 – 2024-06-10 (×3): 500 ug via ORAL
  Filled 2024-06-05 (×3): qty 1

## 2024-06-05 MED ORDER — OLANZAPINE 10 MG IM SOLR
5.0000 mg | Freq: Once | INTRAMUSCULAR | Status: AC
  Administered 2024-06-05: 5 mg via INTRAMUSCULAR
  Filled 2024-06-05: qty 10

## 2024-06-05 MED ORDER — ADULT MULTIVITAMIN W/MINERALS CH
1.0000 | ORAL_TABLET | Freq: Every day | ORAL | Status: DC
  Administered 2024-06-06 – 2024-06-11 (×5): 1 via ORAL
  Filled 2024-06-05 (×5): qty 1

## 2024-06-05 MED ORDER — ASPIRIN 325 MG PO TABS
325.0000 mg | ORAL_TABLET | Freq: Two times a day (BID) | ORAL | Status: DC
  Administered 2024-06-06 – 2024-06-11 (×10): 325 mg via ORAL
  Filled 2024-06-05 (×11): qty 1

## 2024-06-05 MED ORDER — AMLODIPINE BESYLATE 5 MG PO TABS
5.0000 mg | ORAL_TABLET | Freq: Every day | ORAL | Status: DC
  Administered 2024-06-06 – 2024-06-11 (×5): 5 mg via ORAL
  Filled 2024-06-05 (×5): qty 1

## 2024-06-05 MED ORDER — DOCUSATE SODIUM 100 MG PO CAPS
100.0000 mg | ORAL_CAPSULE | Freq: Two times a day (BID) | ORAL | Status: DC
  Administered 2024-06-07 – 2024-06-11 (×8): 100 mg via ORAL
  Filled 2024-06-05 (×9): qty 1

## 2024-06-05 MED ORDER — STERILE WATER FOR INJECTION IJ SOLN
INTRAMUSCULAR | Status: AC
  Filled 2024-06-05: qty 10

## 2024-06-05 NOTE — Consult Note (Incomplete)
 [facility] Psychiatric Consult Initial  Patient Name: .Shirley Woods  MRN: [id number]  DOB: 06/02/1940  Consult Order details:  Orders (From admission, onward)     Start     Ordered   06/05/24 1701  CONSULT TO CALL ACT TEAM       Ordering Provider: Pamella Ozell LABOR, DO  Provider:  (Not yet assigned)  Question:  Reason for Consult?  Answer:  Psych consult   06/05/24 1701   06/05/24 1701  IP CONSULT TO PSYCHIATRY       Ordering Provider: Pamella Ozell LABOR, DO  Provider:  (Not yet assigned)  Question:  Reason for consult:  Answer:  Medication management   06/05/24 1701             Mode of Visit: Tele-visit Virtual Statement:TELE PSYCHIATRY ATTESTATION & CONSENT As the provider for this telehealth consult, I attest that I verified the patient's identity using two separate identifiers, introduced myself to the patient, provided my credentials, disclosed my location, and performed this encounter via a HIPAA-compliant, real-time, face-to-face, two-way, interactive audio and video platform and with the full consent and agreement of the patient (or guardian as applicable.) Patient physical location: [place]. Telehealth provider physical location: home office in state of [place] .   Video start time:   Video end time:      Psychiatry Consult Evaluation  Service Date: June 05, 2024 LOS:  LOS: 0 days  Chief Complaint Aggressive Behavior Primary Psychiatric Diagnoses  Dementia 2.  *** 3.  ***  Assessment  Shirley Woods is a 84 y.o. female admitted: Presented to the EDfor 06/05/2024  1:29 PM for ***. She carries the psychiatric diagnoses of *** and has a past medical history of  ***.   Her current presentation of *** is most consistent with ***. She meets criteria for *** based on ***.  Current outpatient psychotropic medications include *** and historically she has had a *** response to these medications. She was *** compliant with medications prior to admission as  evidenced by ***. On initial examination, patient ***. Please see plan below for detailed recommendations.   Diagnoses:  Active Hospital problems: Active Problems:   * No active hospital problems. *    Plan   ## Psychiatric Medication Recommendations:  ***  ## Medical Decision Making Capacity: {CHL BH MEDICAL DECISION MAKING CAPACITY:31818}  ## Further Work-up:  -- *** {CHLmacgeneralandspecificworkuprecs:31821} -- most recent EKG on *** had QtC of *** -- Pertinent labwork reviewed earlier this admission includes: ***   ## Disposition:-- {CHLmaccldispo:31820}  ## Behavioral / Environmental: -{CHLmacbehavioralenvironmental2:31847}    ## Safety and Observation Level:  - Based on my clinical evaluation, I estimate the patient to be at *** risk of self harm in the current setting. - At this time, we recommend  {CHL BH SUICIDE OBSERVATION LEVEL:31850}. This decision is based on my review of the chart including patient's history and current presentation, interview of the patient, mental status examination, and consideration of suicide risk including evaluating suicidal ideation, plan, intent, suicidal or self-harm behaviors, risk factors, and protective factors. This judgment is based on our ability to directly address suicide risk, implement suicide prevention strategies, and develop a safety plan while the patient is in the clinical setting. Please contact our team if there is a concern that risk level has changed.  CSSR Risk Category:C-SSRS RISK CATEGORY: No Risk  Suicide Risk Assessment: Patient has following modifiable risk factors for suicide: {CHLmacmodifiablesuicideriskfactors:31822}, which we are addressing by ***.  Patient has following non-modifiable or demographic risk factors for suicide: {CHLmacnonmodifiablesuicideriskfactors:31823} Patient has the following protective factors against suicide: {CHLmacprotectivefactors:31824}  Thank you for this consult request.  Recommendations have been communicated to the primary team.  We will *** at this time.   Janiel Crisostomo, NP       History of Present Illness  Relevant Aspects of Hospital [facility] or ED course:31819} Course:  Admitted on 06/05/2024 for ***. They ***.   Patient Report:  ***  Psych ROS:  Depression: *** Anxiety:  *** Mania (lifetime and current): *** Psychosis: (lifetime and current): ***  Collateral information:  Contacted *** at *** on ***  ROS   Psychiatric and Social History  Psychiatric History:  Information collected from ***  Prev Dx/Sx: *** Current Psych Provider: *** Home Meds (current): *** Previous Med Trials: *** Therapy: ***  Prior Psych Hospitalization: ***  Prior Self Harm: *** Prior Violence: ***  Family Psych History: *** Family Hx suicide: ***  Social History:  Developmental Hx: *** Educational Hx: *** Occupational Hx: *** Legal Hx: *** Living Situation: *** Spiritual Hx: *** Access to weapons/lethal means: ***   Substance History Alcohol: ***  Type of alcohol *** Last Drink *** Number of drinks per day *** History of alcohol withdrawal seizures *** History of DT's *** Tobacco: *** Illicit drugs: *** Prescription drug abuse: *** Rehab hx: ***  Exam Findings  Physical Exam: *** Vital Signs:  Temp:  [98.5 F (36.9 C)-98.9 F (37.2 C)] 98.9 F (37.2 C) (01/24 1742) Pulse Rate:  [95-126] 110 (01/24 1742) Resp:  [16-19] 19 (01/24 1742) BP: (127-186)/(85-122) 139/112 (01/24 1742) SpO2:  [96 %-98 %] 96 % (01/24 1700) Blood pressure (!) 139/112, pulse (!) 110, temperature 98.9 F (37.2 C), temperature source Axillary, resp. rate 19, SpO2 96%. There is no height or weight on file to calculate BMI.  Physical Exam  Mental Status Exam: General Appearance: {Appearance:22683}  Orientation:  {BHH ORIENTATION (PAA):22689}  Memory:  {BHH MEMORY:22881}  Concentration:  {Concentration:21399}  Recall:  {BHH GOOD/FAIR/POOR:22877}   Attention  {BH Attention Span:31825}  Eye Contact:  {BHH EYE CONTACT:22684}  Speech:  {Speech:22685}  Language:  {BHH GOOD/FAIR/POOR:22877}  Volume:  {Volume (PAA):22686}  Mood: ***  Affect:  {Affect (PAA):22687}  Thought Process:  {Thought Process (PAA):22688}  Thought Content:  {Thought Content:22690}  Suicidal Thoughts:  {ST/HT (PAA):22692}  Homicidal Thoughts:  {ST/HT (PAA):22692}  Judgement:  {Judgement (PAA):22694}  Insight:  {Insight (PAA):22695}  Psychomotor Activity:  {Psychomotor (PAA):22696}  Akathisia:  {BHH YES OR NO:22294}  Fund of Knowledge:  {BHH GOOD/FAIR/POOR:22877}      Assets:  {Assets (PAA):22698}  Cognition:  {chl bhh cognition:304700322}  ADL's:  {BHH JIO'D:77709}  AIMS (if indicated):        Other History   These have been pulled in through the EMR, reviewed, and updated if appropriate.  Family History:  The patient's Family history is unknown by patient.  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)    Short term memory loss   Dementia (HCC)    Family history of adverse reaction to anesthesia    Hypertension     Surgical History: Past Surgical History:  Procedure Laterality Date   ABDOMINAL HYSTERECTOMY     APPENDECTOMY     IRRIGATION AND DEBRIDEMENT FOOT Right 04/23/2024   Procedure: IRRIGATION  AND DEBRIDEMENT FOOT;  Surgeon: Kendal Franky SQUIBB, MD;  Location: [facility] OR;  Service: Orthopedics;  Laterality: Right;   LAPAROSCOPIC APPENDECTOMY N/A 07/29/2017   Procedure: APPENDECTOMY LAPAROSCOPIC;  Surgeon: Sebastian Moles, MD;  Location: [facility] OR;  Service: General;  Laterality: N/A;   ORIF CALCANEOUS FRACTURE Right 04/23/2024   Procedure: OPEN REDUCTION INTERNAL FIXATION (ORIF) CALCANEOUS FRACTURE;  Surgeon: Kendal Franky SQUIBB, MD;  Location: [facility] OR;  Service: Orthopedics;  Laterality:  Right;   PERCUTANEOUS PINNING Left 04/23/2024   Procedure: PINNING, EXTREMITY, PERCUTANEOUS;  Surgeon: Kendal Franky SQUIBB, MD;  Location: [facility] OR;  Service: Orthopedics;  Laterality: Left;   REVERSE SHOULDER ARTHROPLASTY Right 11/28/2020   Procedure: REVERSE SHOULDER ARTHROPLASTY;  Surgeon: Josefina Chew, MD;  Location: [facility] ORS;  Service: Orthopedics;  Laterality: Right;     Medications:  Current Medications[1]  Allergies: Allergies[2]  Verner Mccrone, NP       [1]  Current Facility-Administered Medications:    amLODipine  (NORVASC ) tablet 5 mg, 5 mg, Oral, Daily, Penna, Michael A, DO   aspirin  tablet 325 mg, 325 mg, Oral, BID, Penna, Michael A, DO   [START ON 06/06/2024] cyanocobalamin  (VITAMIN B12) tablet 500 mcg, 500 mcg, Oral, QODAY, Penna, Michael A, DO   docusate sodium  (COLACE) capsule 100 mg, 100 mg, Oral, BID, Penna, Michael A, DO   donepezil  (ARICEPT ) tablet 5 mg, 5 mg, Oral, QHS, Penna, Michael A, DO   [START ON 06/06/2024] feeding supplement (ENSURE PLUS HIGH PROTEIN) liquid 237 mL, 237 mL, Oral, BID BM, Pamella Sharper A, DO   lamoTRIgine  (LAMICTAL ) tablet 150 mg, 150 mg, Oral, QHS, Penna, Michael A, DO   multivitamin with minerals tablet 1 tablet, 1 tablet, Oral, Daily, Pamella Sharper A, DO   OLANZapine  (ZYPREXA ) injection 5 mg, 5 mg, Intramuscular, Once, Pamella Sharper LABOR, DO  Current Outpatient Medications:    acetaminophen  (TYLENOL ) 500 MG tablet, Take 2 tablets (1,000 mg total) by mouth every 6 (six) hours., Disp: , Rfl:    amLODipine  (NORVASC ) 5 MG tablet, TAKE ONE TABLET BY MOUTH ONCE A DAY (Patient taking differently: Take 2.5 mg by mouth daily.), Disp: 90 tablet, Rfl: 0   aspirin  325 MG tablet, Take 1 tablet (325 mg total) by mouth 2 (two) times daily., Disp: , Rfl:    Carboxymethylcellulose Sodium 1 % GEL, Place 1 application into both eyes daily as needed (dry eyes)., Disp: , Rfl:    cholecalciferol  (CHOLECALCIFEROL ) 25 MCG tablet, Take 2 tablets  (2,000 Units total) by mouth daily., Disp: , Rfl:    Cyanocobalamin  (B-12 PO), Take 1 capsule by mouth every other day. (Patient not taking: Reported on 05/20/2024), Disp: , Rfl:    docusate sodium  (COLACE) 100 MG capsule, Take 1 capsule (100 mg total) by mouth 2 (two) times daily., Disp: , Rfl:    donepezil  (ARICEPT ) 5 MG tablet, Take 1 tablet (5 mg total) by mouth at bedtime., Disp: 30 tablet, Rfl: 3   feeding supplement (ENSURE PLUS HIGH PROTEIN) LIQD, Take 237 mLs by mouth 2 (two) times daily between meals. (Patient not taking: Reported on 05/20/2024), Disp: , Rfl:    lamoTRIgine  (LAMICTAL ) 150 MG tablet, Take 150 mg by mouth at bedtime., Disp: , Rfl:    lamoTRIgine  (LAMICTAL ) 25 MG tablet, Take 1 tablet (25 mg total) by mouth daily. (Patient not taking: Reported on 05/20/2024), Disp: 60 tablet, Rfl: 2   lidocaine  (LIDODERM ) 5 %, Place 1 patch onto the skin daily. Remove & Discard patch within 12 hours or as directed  by MD (Patient not taking: Reported on 05/20/2024), Disp: 30 patch, Rfl: 0   LORazepam  (ATIVAN ) 0.5 MG tablet, Take 1 tablet (0.5 mg total) by mouth every 6 (six) hours as needed for anxiety (agitation). (Patient taking differently: Take 0.5 mg by mouth every 6 (six) hours as needed for anxiety (agitation). For 14 days starting 05/18/24), Disp: 10 tablet, Rfl: 0   LORazepam  (ATIVAN ) 1 MG tablet, Take 1.5 mg ( 1 1/2 tablet) PO at night (Patient not taking: Reported on 05/20/2024), Disp: 45 tablet, Rfl: 4   Multiple Vitamin (MULTIVITAMIN WITH MINERALS) TABS tablet, Take 1 tablet by mouth daily. (Patient not taking: Reported on 05/20/2024), Disp: , Rfl:    polyethylene glycol (MIRALAX  / GLYCOLAX ) 17 g packet, Take 17 g by mouth daily. (Patient taking differently: Take 17 g by mouth daily as needed for mild constipation.), Disp: , Rfl:    QUEtiapine  (SEROQUEL ) 100 MG tablet, Take 1 tablet (100 mg total) by mouth at bedtime., Disp: , Rfl:    QUEtiapine  (SEROQUEL ) 50 MG tablet, Take 1 tablet  (50 mg total) by mouth 2 (two) times daily at 10 am and 4 pm., Disp: , Rfl:    sertraline  (ZOLOFT ) 100 MG tablet, Take 1 tablet (100 mg total) by mouth every evening., Disp: , Rfl:    UNABLE TO FIND, Take 120 mLs by mouth in the morning and at bedtime. Med Name: Med Plus 2.0, Disp: , Rfl:    zinc  gluconate 50 MG tablet, Take 50 mg by mouth daily., Disp: , Rfl:  [2] No Known Allergies

## 2024-06-05 NOTE — ED Provider Notes (Signed)
 " [facility] AT [facility] Provider Note   CSN: [id number] Arrival date & time: 06/05/24  1322     Patient presents with: Aggressive Behavior   Shirley Woods is a 84 y.o. female.With a history of dementia with aggressive behavior who presents to the ED for aggressive behavior.  Patient was recently seen in this ED for altered mental status attributed to dementia with behavioral disturbance.  She was evaluated by her psychiatric team and cleared for discharge back to facility [facility] home).  Recently tested positive for COVID at the facility but became aggressive attempting to bite staff there earlier today and they sent her here for further evaluation.  Upon my assessment she is calm and cooperative without systemic complaints at this time.  She cannot recall why she was brought here today.  No reported falls fevers or other overt infectious symptoms.  She is in bilateral walking boots after ORIF of bilateral foot/ankle fractures on December 12 (Dr.Haddix)   HPI     Prior to Admission medications  Medication Sig Start Date End Date Taking? Authorizing Provider  acetaminophen  (TYLENOL ) 500 MG tablet Take 2 tablets (1,000 mg total) by mouth every 6 (six) hours. 05/17/24   Augustus Almarie RAMAN, PA-C  amLODipine  (NORVASC ) 5 MG tablet TAKE ONE TABLET BY MOUTH ONCE A DAY Patient taking differently: Take 2.5 mg by mouth daily. 03/31/24   Amin, Saad, MD  aspirin  325 MG tablet Take 1 tablet (325 mg total) by mouth 2 (two) times daily. 05/17/24   Augustus Almarie RAMAN, PA-C  Carboxymethylcellulose Sodium 1 % GEL Place 1 application into both eyes daily as needed (dry eyes).    [provider]  cholecalciferol  (CHOLECALCIFEROL ) 25 MCG tablet Take 2 tablets (2,000 Units total) by mouth daily. 05/17/24   Augustus Almarie RAMAN, PA-C  Cyanocobalamin  (B-12 PO) Take 1 capsule by mouth every other day. Patient not taking: Reported on 05/20/2024    [provider]   docusate sodium  (COLACE) 100 MG capsule Take 1 capsule (100 mg total) by mouth 2 (two) times daily. 05/17/24   Augustus Almarie RAMAN, PA-C  donepezil  (ARICEPT ) 5 MG tablet Take 1 tablet (5 mg total) by mouth at bedtime. 04/06/24   Amin, Saad, MD  feeding supplement (ENSURE PLUS HIGH PROTEIN) LIQD Take 237 mLs by mouth 2 (two) times daily between meals. Patient not taking: Reported on 05/20/2024 05/17/24   Augustus Almarie RAMAN, PA-C  lamoTRIgine  (LAMICTAL ) 150 MG tablet Take 150 mg by mouth at bedtime.    [provider]  lamoTRIgine  (LAMICTAL ) 25 MG tablet Take 1 tablet (25 mg total) by mouth daily. Patient not taking: Reported on 05/20/2024 03/17/24   Amin, Saad, MD  lidocaine  (LIDODERM ) 5 % Place 1 patch onto the skin daily. Remove & Discard patch within 12 hours or as directed by MD Patient not taking: Reported on 05/20/2024 05/20/23   Amin, Saad, MD  LORazepam  (ATIVAN ) 0.5 MG tablet Take 1 tablet (0.5 mg total) by mouth every 6 (six) hours as needed for anxiety (agitation). Patient taking differently: Take 0.5 mg by mouth every 6 (six) hours as needed for anxiety (agitation). For 14 days starting 05/18/24 05/17/24   Simaan, Elizabeth S, PA-C  LORazepam  (ATIVAN ) 1 MG tablet Take 1.5 mg ( 1 1/2 tablet) PO at night Patient not taking: Reported on 05/20/2024 01/23/24   Amin, Saad, MD  Multiple Vitamin (MULTIVITAMIN WITH MINERALS) TABS tablet Take 1 tablet by mouth daily. Patient not taking: Reported on  05/20/2024    [provider]  polyethylene glycol (MIRALAX  / GLYCOLAX ) 17 g packet Take 17 g by mouth daily. Patient taking differently: Take 17 g by mouth daily as needed for mild constipation. 05/17/24   Augustus Almarie RAMAN, PA-C  QUEtiapine  (SEROQUEL ) 100 MG tablet Take 1 tablet (100 mg total) by mouth at bedtime. 05/17/24   Simaan, Elizabeth S, PA-C  QUEtiapine  (SEROQUEL ) 50 MG tablet Take 1 tablet (50 mg total) by mouth 2 (two) times daily at 10 am and 4 pm. 05/17/24   Augustus Almarie RAMAN, PA-C   sertraline  (ZOLOFT ) 100 MG tablet Take 1 tablet (100 mg total) by mouth every evening. 05/17/24   Simaan, Almarie RAMAN, PA-C  UNABLE TO FIND Take 120 mLs by mouth in the morning and at bedtime. Med Name: Med Plus 2.0    [provider]  zinc  gluconate 50 MG tablet Take 50 mg by mouth daily.    [provider]    Allergies: Patient has no known allergies.    Review of Systems  Updated Vital Signs BP (!) 175/95   Pulse (!) 119   Temp 98.6 F (37 C) (Oral)   Resp 18   SpO2 96%   Physical Exam Vitals and nursing note reviewed.  HENT:     Head: Normocephalic and atraumatic.  Eyes:     Pupils: Pupils are equal, round, and reactive to light.  Cardiovascular:     Rate and Rhythm: Normal rate and regular rhythm.  Pulmonary:     Effort: Pulmonary effort is normal.     Breath sounds: Normal breath sounds.  Abdominal:     Palpations: Abdomen is soft.     Tenderness: There is no abdominal tenderness.  Musculoskeletal:     Comments: Bilateral walking boots in place  Skin:    General: Skin is warm and dry.  Neurological:     Mental Status: She is alert.     Comments: Oriented to self only 5 out of 5 motor strength lateral upper and lower extremities  Psychiatric:        Mood and Affect: Mood normal.     (all labs ordered are listed, but only abnormal results are displayed) Labs Reviewed  RESP PANEL BY RT-PCR (RSV, FLU A&B, COVID)  RVPGX2 - Abnormal; Notable for the following components:      Result Value   SARS Coronavirus 2 by RT PCR POSITIVE (*)    All other components within normal limits  BASIC METABOLIC PANEL WITH GFR - Abnormal; Notable for the following components:   Glucose, Bld 120 (*)    BUN 7 (*)    Calcium 10.5 (*)    All other components within normal limits  URINALYSIS, W/ REFLEX TO CULTURE (INFECTION SUSPECTED) - Abnormal; Notable for the following components:   Color, Urine STRAW (*)    Ketones, ur 5 (*)    Leukocytes,Ua TRACE (*)    All  other components within normal limits  CBC WITH DIFFERENTIAL/PLATELET    EKG: None  Radiology: DG Chest Portable 1 View Result Date: 06/05/2024 CLINICAL DATA:  Altered mental status. History of dementia. COVID positive with possible pneumonia. EXAM: PORTABLE CHEST 1 VIEW COMPARISON:  04/22/2024. FINDINGS: The heart size and mediastinal contours are within normal limits. Minimal atelectasis or scarring is noted at the lung bases. No consolidation, effusion, or pneumothorax is seen. Shoulder arthroplasty changes are present on the right. Rib fractures are noted at T5 through T7 on the left, new from the  previous exam and no bone callus formation is seen. IMPRESSION: 1. No active disease. 2. New fractures of the T5 through T7 ribs on the left, likely acute or subacute. Electronically Signed   By: Leita Birmingham M.D.   On: 06/05/2024 14:52     Procedures   Medications Ordered in the ED  amLODipine  (NORVASC ) tablet 5 mg (has no administration in time range)  aspirin  tablet 325 mg (has no administration in time range)  B-12 TABS (has no administration in time range)  docusate sodium  (COLACE) capsule 100 mg (has no administration in time range)  donepezil  (ARICEPT ) tablet 5 mg (has no administration in time range)  lamoTRIgine  (LAMICTAL ) tablet 150 mg (has no administration in time range)  feeding supplement (ENSURE PLUS HIGH PROTEIN) liquid 237 mL (has no administration in time range)  lamoTRIgine  (LAMICTAL ) tablet 25 mg (has no administration in time range)  multivitamin with minerals tablet 1 tablet (has no administration in time range)  OLANZapine  (ZYPREXA ) injection 5 mg (5 mg Intramuscular Given 06/05/24 1440)  sterile water  (preservative free) injection (2.1 mLs Intramuscular Given 06/05/24 1440)    Clinical Course as of 06/05/24 1704  Sat Jun 05, 2024  1438 Labs have been drawn x-ray taken.    Patient again has become very agitated.  She crawled out of her bed and was found on the  floor next to the wall.  No visible evidence of significant trauma on reevaluation.   We were unable to verbally redirect.  She will require soft restraints for her safety and the safety of our staff.  Will administer 5 mg IM Zyprexa  and reassess [MP]  1659 Laboratory workup unremarkable.  No UTI.  Chest x-ray shows no evidence of pneumonia but does show some posterior rib fractures likely from recent falls.  I spoke with the patient's husband over the phone and had a long discussion with her son who is here in the emergency department.  The issue with her going back to her current facility would be that she is still in a rehab unit and they cannot manage her behavioral issues.  They cannot complete her rehab care on the memory care unit either.  She will need to undergo repeat evaluation from our TTS and case management team to determine the safest disposition as our previous plan does not seem to be safe and effective for her. [MP]    Clinical Course User Index [MP] Pamella Ozell LABOR, DO                                 Medical Decision Making 84 year old female with history as above brought in by EMS from nursing facility after exhibiting aggressive behavior towards staff.  Has a history of dementia with aggressive behavior.  Recently sustained bilateral foot/ankle fractures with operative repair required performed on December 12.  Bilateral walking boots.  Was cooperative on my exam with no aggressive behavior.  Based on prior notes here she does have a history of sundowning and required IM Haldol  last time.  Will evaluate for infectious or metabolic disturbance and plan for discharge back to nursing facility if she remains stable without medical necessity for admission  Amount and/or Complexity of Data Reviewed Labs: ordered. Radiology: ordered.  Risk OTC drugs. Prescription drug management.        Final diagnoses:  Dementia with agitation, unspecified dementia severity, unspecified  dementia type (HCC)  Closed fracture of multiple  ribs of left side, initial encounter    ED Discharge Orders     None          Pamella Ozell LABOR, DO 06/05/24 1704  "

## 2024-06-05 NOTE — ED Triage Notes (Addendum)
 Pt BIBA from [facility], last night pt became aggressive, attempting to bite staff and verbally aggressive.  A&O x 1 at baseline r/t dementia with history of aggression.  Per facility pt is COVID +  160/100  All other vitals within range

## 2024-06-05 NOTE — ED Notes (Signed)
 PT is refusing to let writer check pull up

## 2024-06-05 NOTE — ED Notes (Signed)
 Assumed care of pt, found her in violent restraints.  Spoke to Provider who changed orders to non-violent.  RN changed wrist restraints to non violent and also placed mitts on pts to keep from scratching RN.  Ankle restraints removed.  While she continues to be disoriented RN was able to switch out restraints one at a time w/o being harmed .
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
## 2024-06-05 NOTE — ED Notes (Signed)
TTS started  
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
## 2024-06-06 MED ORDER — STERILE WATER FOR INJECTION IJ SOLN
INTRAMUSCULAR | Status: AC
  Administered 2024-06-07: 1.2 mL
  Filled 2024-06-06: qty 10

## 2024-06-06 MED ORDER — ZIPRASIDONE MESYLATE 20 MG IM SOLR
10.0000 mg | Freq: Once | INTRAMUSCULAR | Status: AC
  Administered 2024-06-07: 10 mg via INTRAMUSCULAR
  Filled 2024-06-06: qty 20

## 2024-06-06 MED ORDER — DIPHENHYDRAMINE HCL 50 MG/ML IJ SOLN
25.0000 mg | INTRAMUSCULAR | Status: AC
  Administered 2024-06-06: 25 mg via INTRAVENOUS
  Filled 2024-06-06: qty 1

## 2024-06-06 MED ORDER — STERILE WATER FOR INJECTION IJ SOLN
INTRAMUSCULAR | Status: AC
  Filled 2024-06-06: qty 10

## 2024-06-06 MED ORDER — ZIPRASIDONE MESYLATE 20 MG IM SOLR
10.0000 mg | Freq: Once | INTRAMUSCULAR | Status: AC
  Administered 2024-06-06: 10 mg via INTRAMUSCULAR
  Filled 2024-06-06: qty 20

## 2024-06-06 MED ORDER — RISPERIDONE 0.5 MG PO TABS
0.2500 mg | ORAL_TABLET | Freq: Two times a day (BID) | ORAL | Status: DC
  Administered 2024-06-06 – 2024-06-11 (×9): 0.25 mg via ORAL
  Filled 2024-06-06 (×11): qty 1

## 2024-06-06 MED ORDER — AMLODIPINE BESYLATE 5 MG PO TABS
5.0000 mg | ORAL_TABLET | Freq: Once | ORAL | Status: AC
  Administered 2024-06-06: 5 mg via ORAL
  Filled 2024-06-06: qty 1

## 2024-06-06 MED ORDER — OLANZAPINE 10 MG IM SOLR
5.0000 mg | Freq: Once | INTRAMUSCULAR | Status: AC
  Administered 2024-06-06: 5 mg via INTRAMUSCULAR
  Filled 2024-06-06: qty 10

## 2024-06-06 NOTE — ED Notes (Signed)
 Patient asking for feet to be rewrapped and washed.  Patient allowed this RN to remove current dressings and wash feet.  New bandages placed.

## 2024-06-06 NOTE — ED Provider Notes (Signed)
 Emergency Medicine Observation Re-evaluation Note  [place] is a 84 y.o. female, seen on rounds today.  Pt initially presented to the ED for complaints of Aggressive Behavior Currently, the patient is resting.  Physical Exam  BP (!) 190/98 (BP Location: Right Arm)   Pulse (!) 109   Temp 98.5 F (36.9 C) (Oral)   Resp 18   SpO2 95%  Physical Exam General: NAD   ED Course / MDM  EKG:   I have reviewed the labs performed to date as well as medications administered while in observation.  Recent changes in the last 24 hours include no acute events reported.  Plan  Current plan is for return to facility?    Laurice Maude BROCKS, MD 06/06/24 [phone]

## 2024-06-06 NOTE — Progress Notes (Addendum)
 CSW received a consult for SNF placement. The pt is from [facility]. CSW attempted to contact Brittany with admission to determine if the pt can return; no answer, voicemail left requesting a return call. CSW also attempted to call the main number with no answer. ICM to follow.  Adden 3:30pm CSW spoke with pts daughter, Tylene, who reported speaking with Kristy at [place] today. She stated that [facility] reported they have not received any information from the hospital. CSW informed the pts daughter that several attempts were made to contact the facility with no return call. The pts daughter provided CSW with the contact information for Tully, a staff member, at [phone]. Pt's daughter is requesting to speak with provider, MD made aware.   CSW attempted to contact [facility]; no answer. voicemail was left requesting a return call. ICM to follow.

## 2024-06-06 NOTE — TOC Progression Note (Signed)
 Transition of Care [facility]) - Progression Note    Patient Details  Name: [place] MRN: [id number] Date of Birth: Jan 02, 1941  Transition of Care [facility]) CM/SW Contact  Sonda Manuella Quill, RN Phone Number: 06/06/2024, 4:34 PM  Clinical Narrative:    Received call from Brittany, Admissions at [facility]; she said pt cannot admit to facility until tomorrow; attempted to notify ED RN;LM for RN w/ [facility] ED Secretary.                     Expected Discharge Plan and Services                                               Social Drivers of Health (SDOH) Interventions SDOH Screenings   Food Insecurity: No Food Insecurity (04/23/2024)  Housing: Low Risk (04/23/2024)  Transportation Needs: No Transportation Needs (04/23/2024)  Utilities: Not At Risk (04/23/2024)  Depression (PHQ2-9): Medium Risk (04/06/2024)  Social Connections: Patient Unable To Answer (04/23/2024)  Tobacco Use: Low Risk (05/18/2024)    Readmission Risk Interventions     No data to display

## 2024-06-06 NOTE — ED Notes (Signed)
 Patient sitting with RN at desk.  Patient willing ate a sandwich and crackers.  Remains calm and cooperative
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
## 2024-06-06 NOTE — ED Notes (Signed)
 Patient assisted to wheelchair and to restroom.  Patient urinated and new brief placed.

## 2024-06-06 NOTE — ED Notes (Signed)
 RN sat and spoke to pt for about 30 minutes.  She continues to be very confused despite trying to reorient her.  Pt rambled on but did not make much sense.  RN sat and spoke to her reassuring her she was safe, hoping she would fall asleep however she did not.

## 2024-06-06 NOTE — ED Notes (Signed)
 RN gave pt a drink of water  w/ no issues.  She continues to refuse her PO medications

## 2024-06-06 NOTE — ED Provider Notes (Addendum)
 1500 Cleared by psychiatry.  Awaiting TOC evaluation regarding best disposition plan for both her behavioral needs and rehabilitation needs   1804 TOC evaluation complete.  Patient cleared to return back to [facility] most likely tomorrow.  2100: Reevaluated patient.  She is resting, comfortably allowing nursing staff to redress her postsurgical lower extremity wounds   Pamella Ozell LABOR, DO 06/06/24 1334    Pamella Ozell LABOR, DO 06/06/24 1805    Pamella Ozell LABOR, DO 06/06/24 2140

## 2024-06-06 NOTE — ED Notes (Signed)
 Patient assisted back in bed.  Assisted to eat a banana and wash her hands.  Remains confused, but able to be directed.

## 2024-06-06 NOTE — ED Notes (Signed)
 Patient calm and cooperative with this RN.  Able to remove mittens and soft restraints.

## 2024-06-06 NOTE — ED Notes (Signed)
 Pt has not slept all night, she continues to be confused.  She continues to yell and call for help but is assuasive when RNs attempt to assist her.  She has refused all meds but was given some IM to assist to little effect on her behaviors.
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
## 2024-06-07 NOTE — ED Notes (Signed)
 Patient remains restless at this time.  Unable to redirect

## 2024-06-07 NOTE — ED Notes (Signed)
 Patient provided with sips of water  at this time

## 2024-06-07 NOTE — Progress Notes (Addendum)
 Per chart review, pt can admit back to [facility] today. Outreached to Brittany to inquire about time. Awaiting response.   Addend @7 :51AM Per Brittany, clinical team will review notes around 9/10AM.  Addend @ 9:17AM Received call back from Brittany at [facility] who reported their medical director would like to speak with EDP to discuss pt. Reports they cannot accept pt back today in her current state. CSW notified EDP, RN, and Consulting Civil Engineer via secure chat. Will provide EDP w/medical director contact info once received.   Addend @ 10:29AM Awaiting contact information for medical director. Brittany reported she is waiting to receive the info.   CSW spoke with pts daughter in law, Maeola, and provided an update.   Addend @ 11:55AM Medical Director of [employer] provided to EDP via secure chat .

## 2024-06-07 NOTE — Progress Notes (Signed)
 EDP notified this clinical research associate via secure chat that [place] is not willing to accept pt back due to requiring 24 hour sitter. They noted the pt already has this service and stated it is not appropriate and has not been successful in containing her. They recommended a SNF w/a locked memory care unit.   CSW contacted pt's daughter in law, [place], to inform and she stated she spoke with [facility] this morning and was told that pt would be able to return once behaviors/agitation is managed. CSW informed our EDP just spoke with medical director who reported they cannot accept her back due to requiring a higher level of care. [place] stated we've already done this song and dance. Theres not many rehabs with memory care units and the ones that are are not good ones. She has to go back to [facility] or get admitted. CSW advised pt is not currently meeting criteria for inpatient  admission. [place] stated so psychosis is not a reason to admit someone? CSW advised psychosis can be managed in ED and does not always require admission to medical floor.   CSW advised only other option is to find another SNF for rehab if [facility] still will not accept pt back.
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
## 2024-06-07 NOTE — ED Provider Notes (Signed)
 Emergency Medicine Observation Re-evaluation Note  Shirley Woods is a 84 y.o. female, seen on rounds today.  Pt initially presented to the ED for complaints of Aggressive Behavior Currently, the patient is resting comfortably, no complaints..  Physical Exam  BP 130/76 (BP Location: Right Arm)   Pulse 100   Temp 98.8 F (37.1 C)   Resp 16   SpO2 92%  Physical Exam General: Well-appearing Cardiac: Normal heart rate and rhythm, exam Lungs: Clear breath sounds bilaterally Psych: Acting appropriately at this time, no active SI, HI, cooperative, calm in the bed at this time.  ED Course / MDM  EKG:EKG Interpretation Date/Time:  Saturday June 05 2024 14:19:22 EST Ventricular Rate:  109 PR Interval:  183 QRS Duration:  80 QT Interval:  320 QTC Calculation: 431 R Axis:   -9  Text Interpretation: Sinus tachycardia Ventricular premature complex Probable left atrial enlargement Left ventricular hypertrophy Confirmed by Haze Lonni PARAS [phone]) on 06/06/2024 10:49:17 AM  I have reviewed the labs performed to date as well as medications administered while in observation.  Recent changes in the last 24 hours include no significant change since last evaluation.  Plan  Current plan is for plan for return back to [facility] facility likely today.    Rosan Sherlean DEL, PA-C 06/07/24 [id number]    Bari Roxie HERO, DO 06/07/24 0757    Bari Roxie HERO, DO 06/07/24 1245

## 2024-06-07 NOTE — ED Notes (Signed)
 Patient remains agitated.  Calling out for Shirley Woods and asking did you kill her.  Pt has not rested throughout night.  Will continue to attempt to reorient

## 2024-06-07 NOTE — ED Notes (Signed)
 Assisted pt in eating supper and untied one soft restraint on R wrist.  Pt has been cooperative since.  Pt finished eating and cleared tray.

## 2024-06-07 NOTE — ED Notes (Signed)
 Pt was ask if she wanted her breakfast and she refused

## 2024-06-07 NOTE — Progress Notes (Addendum)
 CSW spoke with Sherri who reported she spoke with the DON at [facility] who did not have any knowledge of conversation between EDP and [facility]. She reported the DON will be following up and calling her back. ICM following.  Addend @ 4:09PM CSW spoke with pt's daughter in law, Maeola, who reported she received a call back from the DON and was told that they are not saying they won't accept her back but also not saying they will. The DON reported they need until tomorrow to make a determination as they are awaiting guidance from their higher ups.   Per daughter in law, pt was in independent living at [facility] with her husband. Pt ended up going into memory care for 4 days due to worsening dementia and was able to open a window, tie sheets together and climb out. Pt fractured ankle from falling and was then placed into rehab at [facility]. CSW outreached to Brittany, rep at [facility] to inquire about what is needed from ED to support pt's return. Per chart review and EDP, psych started pt on Risperdal  .25mg  twice daily and pt started it the morning of 1/25. ICM following.

## 2024-06-07 NOTE — ED Notes (Signed)
 Patient becoming more agitated and harder to redirect.  When staff member went to assist patient, patient began to become aggressive and started kicking at staff, pulling hair, and digging her finger nails into staff member's arms.  Unable to be redirected at this time

## 2024-06-08 MED ORDER — ACETAMINOPHEN 325 MG PO TABS
650.0000 mg | ORAL_TABLET | Freq: Four times a day (QID) | ORAL | Status: DC | PRN
  Administered 2024-06-08: 650 mg via ORAL
  Filled 2024-06-08: qty 2

## 2024-06-08 MED ORDER — CARMEX CLASSIC LIP BALM EX OINT
1.0000 | TOPICAL_OINTMENT | CUTANEOUS | Status: DC | PRN
  Administered 2024-06-08: 1 via TOPICAL
  Filled 2024-06-08: qty 10

## 2024-06-08 NOTE — Progress Notes (Addendum)
 CSW inquired last evening about pt being able to return to [facility].CSW received message from Brittany that there will be an update after their 9:30 meeting. She will let this writer know as soon as she can.   Addend @ 11:10AM CSW received call from Brittany and [employer] who informed they are unable to accept pt back to the community. At the time, they had not notified family and CSW requested they did and to also speak with ICM supervisor.   CSW received text from Brittany informing astronomer and DON spoke with ICM Supervisor and to let her know if there's anything needed. CSW inquired about whether they will be searching for alternative placement and was told they are a stand alone community and that the administrator will be working with [facility] supervisor and risk management for new placement.   CSW received call from DIL- Sheri Demarais, to stated she is unsure of what direction to go. Sherri informed it is not an option for the pt to live at their home. CSW offered referral to [facility] and Maeola accepted. CSW did state that plan will be for Memory Care with in house PT/OT to see pt as Sherri previously reported the SNFs with locked units were not good facilities when they were presented to them a month ago. Referral done to [facility]. ICM following.

## 2024-06-08 NOTE — ED Provider Notes (Signed)
 2115 I have reassessed the patient.  There was a note from nursing staff that the husband visited and felt that she was not at her baseline self.  As I come in the room, the patient is very alert she is sitting upright she does have 4 point soft restraints.  She does not seem particularly distressed.  She is talking to me about various things and although some of this is not really situationally oriented she does redirect to answering questions about any issues of pain or discomfort.  She reports she does have some pain in her feet which have had surgical repair.  She rates her left foot pain at about a 6.  Patient is not exhibiting any respiratory distress.  Lungs are clear.  Heart is regular, borderline tachycardic.  Abdomen is soft without guarding.  She does not have any edema above her dressings.  She has clean dry dressings on both feet which have Coban over the ankle and the foot with the toes exposed.  No edema above the Coban and no tenderness of the calf.  Toes are warm and dry.  The dressings are clean without any sign of drainage or discharge.  I have not seen the patient previously to know what her baseline is like but by reviewing EMR, she appears to have fairly advanced dementia with behavioral disturbance.  At this current time she is not extremely agitated but clearly would be up and moving about if not restrained.  She does not appear encephalopathic.  Her speech is clear and intelligible.  Patient did test positive for COVID when she was first evaluated on 1\24.  Objectively, she does not appear to have respiratory symptoms.  It appears that last lab work was from 124.  Will repeat some basic lab work and try to get a urine specimen just for monitoring in elderly patient with some concern on family members part for change from baseline.   Armenta Canning, MD 06/08/24 2203

## 2024-06-08 NOTE — Progress Notes (Signed)
 Shirley Woods from [facility] [employer] called ICM Supervisor, Sidra reported they cannot take pt back to the facility. ICM Supervisor told them its not appropriate for them to leave pt in the ED with no notice and that we would need to escalate the situation. He said he understood and is requesting the phone number to risk management. He said they only took pt with special accommodations and those just are not working- [employer] explained that this placement issue for them should have been addressed prior to the most recent ED visit- he said he understood. ICM to follow

## 2024-06-08 NOTE — ED Provider Notes (Signed)
 Emergency Medicine Observation Re-evaluation Note  Shirley Woods is a 84 y.o. female, seen on rounds today.  Pt initially presented to the ED for complaints of Aggressive Behavior Currently, the patient is resting comfortably.  Physical Exam  BP (!) 155/91 (BP Location: Left Arm)   Pulse (!) 108   Temp 98.2 F (36.8 C) (Oral)   Resp 16   SpO2 94%  Physical Exam General: nad Cardiac: good peripheral perfusion Lungs: bilateral chest rise Psych: resting comfortably   ED Course / MDM  EKG:EKG Interpretation Date/Time:  Saturday June 05 2024 14:19:22 EST Ventricular Rate:  109 PR Interval:  183 QRS Duration:  80 QT Interval:  320 QTC Calculation: 431 R Axis:   -9  Text Interpretation: Sinus tachycardia Ventricular premature complex Probable left atrial enlargement Left ventricular hypertrophy Confirmed by Haze Lonni PARAS [phone]) on 06/06/2024 10:49:17 AM  I have reviewed the labs performed to date as well as medications administered while in observation.  Recent changes in the last 24 hours include Patient not able to go back to her SNF.  Plan  Current plan is for placement.  Unable to go back where she lives without 24-hour sitter.    Emil Share, DO 06/08/24 [phone]

## 2024-06-09 LAB — CBC
HCT: 36.2 % (ref 36.0–46.0)
Hemoglobin: 11.9 g/dL — ABNORMAL LOW (ref 12.0–15.0)
MCH: 30.5 pg (ref 26.0–34.0)
MCHC: 32.9 g/dL (ref 30.0–36.0)
MCV: 92.8 fL (ref 80.0–100.0)
Platelets: 304 10*3/uL (ref 150–400)
RBC: 3.9 MIL/uL (ref 3.87–5.11)
RDW: 14.4 % (ref 11.5–15.5)
WBC: 10.8 10*3/uL — ABNORMAL HIGH (ref 4.0–10.5)
nRBC: 0 % (ref 0.0–0.2)

## 2024-06-09 LAB — BASIC METABOLIC PANEL WITH GFR
Anion gap: 12 (ref 5–15)
BUN: 36 mg/dL — ABNORMAL HIGH (ref 8–23)
CO2: 25 mmol/L (ref 22–32)
Calcium: 10.5 mg/dL — ABNORMAL HIGH (ref 8.9–10.3)
Chloride: 105 mmol/L (ref 98–111)
Creatinine, Ser: 1.09 mg/dL — ABNORMAL HIGH (ref 0.44–1.00)
GFR, Estimated: 50 mL/min — ABNORMAL LOW
Glucose, Bld: 131 mg/dL — ABNORMAL HIGH (ref 70–99)
Potassium: 3.9 mmol/L (ref 3.5–5.1)
Sodium: 142 mmol/L (ref 135–145)

## 2024-06-09 NOTE — ED Notes (Signed)
 Pt states unable to void at this time.

## 2024-06-09 NOTE — ED Notes (Signed)
 Pt is sitting up in bed. She has been holding conversation with staff. Pt is waving and laughing with care team at this time.

## 2024-06-09 NOTE — ED Provider Notes (Signed)
 Emergency Medicine Observation Re-evaluation Note  Shirley Woods is a 84 y.o. female, seen on rounds today.  Pt initially presented to the ED for complaints of Aggressive Behavior Currently, the patient is asleep.  Physical Exam  BP (!) 141/81 (BP Location: Left Arm)   Pulse (!) 101   Temp (!) 97.5 F (36.4 C) (Axillary)   Resp 17   SpO2 98%  Physical Exam General: asleep  ED Course / MDM  EKG:EKG Interpretation Date/Time:  Saturday June 05 2024 14:19:22 EST Ventricular Rate:  109 PR Interval:  183 QRS Duration:  80 QT Interval:  320 QTC Calculation: 431 R Axis:   -9  Text Interpretation: Sinus tachycardia Ventricular premature complex Probable left atrial enlargement Left ventricular hypertrophy Confirmed by Haze Lonni PARAS [phone]) on 06/06/2024 10:49:17 AM  I have reviewed the labs performed to date as well as medications administered while in observation.  Recent changes in the last 24 hours repeat labs reassuring, previous COVID +  Plan  Current plan is for TOC placement.    Ruthe Cornet, DO 06/09/24 [phone]

## 2024-06-09 NOTE — Progress Notes (Addendum)
 [facility] has located 3 communities with openings and is awaiting a response from pt's daughter in law. CSW also outreached to daughter in law and requested she return Shirley Woods's call ASAP.   Addend @ 12:42PM [facility] assessing pt in the AM.

## 2024-06-09 NOTE — ED Notes (Signed)
 Soft restraints taken off of pt due to pt being able to cooperate without issue.

## 2024-06-10 MED ORDER — BOOST PLUS PO LIQD
237.0000 mL | Freq: Two times a day (BID) | ORAL | Status: DC
  Administered 2024-06-10 – 2024-06-11 (×3): 237 mL via ORAL
  Filled 2024-06-10 (×4): qty 237

## 2024-06-10 MED ORDER — TUBERCULIN PPD 5 UNIT/0.1ML ID SOLN
5.0000 [IU] | Freq: Once | INTRADERMAL | Status: DC
  Administered 2024-06-10: 5 [IU] via INTRADERMAL
  Filled 2024-06-10: qty 0.1

## 2024-06-10 NOTE — Evaluation (Signed)
 Physical Therapy Evaluation Patient Details Name: DONABELLE MOLDEN MRN: [id number] DOB: [birth year] Today's Date: 06/10/2024  History of Present Illness  Brentney Goldbach Babington is an 84 y.o. female brought to ED 06/05/24 for AMS, PMH: Bil LE fxs and now is s/p ORIF of right calcaneus and right subtalar joint dislocation; closed reduction of left calcaneus fx, right lateral malleolus fx; closed reduction and percutaneous fixation of left talonavicular joint subluxation; and percutaneous fixation of left navicular fx. 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 PHMx: dementia, prior ABI >20 years ago.  Clinical Impression   Patient alert and able to prticipate in mobility. Patient spouse in to visit and provided information related to Bilateral ankle fractures.  Patient was scheduled a follow up ortho visit this week. Recommend consulting surgeon  to see if NWB status can be upgraded  which would allow therapies to provide functional mobility training.    Patient pleasdant and confused. Patient will benefit from continued inpatient follow up therapy, <3 hours/day       If plan is discharge home, recommend the following: A lot of help with bathing/dressing/bathroom;Assistance with cooking/housework;Direct supervision/assist for medications management;Direct supervision/assist for financial management;Assist for transportation;Help with stairs or ramp for entrance;Supervision due to cognitive status;Two people to help with walking and/or transfers   Can travel by private vehicle        Equipment Recommendations None recommended by PT  Recommendations for Other Services       Functional Status Assessment Patient has had a recent decline in their functional status and demonstrates the ability to make significant improvements in function in a reasonable and predictable amount of time.     Precautions / Restrictions Precautions Precautions:  Fall Precaution/Restrictions Comments: CAM boots when OOB, sundowning Restrictions RLE Weight Bearing Per Provider Order: Non weight bearing LLE Weight Bearing Per Provider Order: Non weight bearing Other Position/Activity Restrictions: per spouse, patient was to see Dr. farrell this week for followup visit.      Mobility  Bed Mobility Overal bed mobility: Needs Assistance Bed Mobility: Supine to Sit, Rolling Rolling: Used rails, Min assist Sidelying to sit: Min assist Supine to sit: Mod assist     General bed mobility comments: assist to roll and to sit upright and back to supine    Transfers                   General transfer comment: NT    Ambulation/Gait                  Stairs            Wheelchair Mobility     Tilt Bed    Modified Rankin (Stroke Patients Only)       Balance Overall balance assessment: Needs assistance Sitting-balance support: No upper extremity supported, Feet supported Sitting balance-Leahy Scale: Fair                                       Pertinent Vitals/Pain Pain Assessment Faces Pain Scale: No hurt    Home Living Family/patient expects to be discharged to:: Skilled nursing facility                        Prior Function  Extremity/Trunk Assessment   Upper Extremity Assessment Upper Extremity Assessment: Overall WFL for tasks assessed    Lower Extremity Assessment Lower Extremity Assessment: RLE deficits/detail;LLE deficits/detail RLE Deficits / Details: feet and ankles wrapped, can lift leg from nbed, some dorsiflexion LLE Deficits / Details: same as right       Communication   Communication Communication: Impaired Factors Affecting Communication: Hearing impaired    Cognition Arousal: Alert Behavior During Therapy: WFL for tasks assessed/performed   PT - Cognitive impairments: History of cognitive impairments, Attention, Sequencing,  Problem solving, Safety/Judgement                       PT - Cognition Comments: oriented to self and spouse Following commands: Impaired Following commands impaired: Follows one step commands inconsistently     Cueing Cueing Techniques: Verbal cues, Gestural cues, Tactile cues, Visual cues     General Comments      Exercises     Assessment/Plan    PT Assessment Patient needs continued PT services  PT Problem List Decreased activity tolerance;Decreased mobility;Decreased knowledge of use of DME;Decreased safety awareness;Pain;Decreased balance;Decreased strength       PT Treatment Interventions Functional mobility training;Therapeutic activities;Therapeutic exercise;Balance training;Patient/family education    PT Goals (Current goals can be found in the Care Plan section)  Acute Rehab PT Goals Patient Stated Goal: Patient eager to work on getting OOB and to prepare for ambulation PT Goal Formulation: With family Time For Goal Achievement: 06/24/24 Potential to Achieve Goals: Fair    Frequency Min 2X/week     Co-evaluation               AM-PAC PT 6 Clicks Mobility  Outcome Measure Help needed turning from your back to your side while in a flat bed without using bedrails?: A Little Help needed moving from lying on your back to sitting on the side of a flat bed without using bedrails?: A Lot Help needed moving to and from a bed to a chair (including a wheelchair)?: Total Help needed standing up from a chair using your arms (e.g., wheelchair or bedside chair)?: Total Help needed to walk in hospital room?: Total Help needed climbing 3-5 steps with a railing? : Total 6 Click Score: 9    End of Session   Activity Tolerance: Patient tolerated treatment well Patient left: in bed;with call bell/phone within reach;with family/visitor present;with restraints reapplied Nurse Communication: Mobility status PT Visit Diagnosis: Other abnormalities of gait and  mobility (R26.89)    Time: 1430-1501 PT Time Calculation (min) (ACUTE ONLY): 31 min   Charges:   PT Evaluation $PT Eval Low Complexity: 1 Low PT Treatments $Therapeutic Activity: 8-22 mins PT General Charges $$ ACUTE PT VISIT: 1 Visit         Darice Potters PT Acute Rehabilitation Services Office [phone]   Potters Darice Norris 06/10/2024, 3:28 PM

## 2024-06-10 NOTE — ED Provider Notes (Signed)
 Emergency Medicine Observation Re-evaluation Note  Shirley Woods is a 84 y.o. female, seen on rounds today.  Pt initially presented to the ED for complaints of Aggressive Behavior Currently, the patient is awaiting TOC placement.  Physical Exam  BP 132/88 (BP Location: Left Arm)   Pulse (!) 106   Temp 98.5 F (36.9 C) (Axillary)   Resp 20   SpO2 94%  Physical Exam Is resting and in no acute distress  ED Course / MDM  EKG:EKG Interpretation Date/Time:  Saturday June 05 2024 14:19:22 EST Ventricular Rate:  109 PR Interval:  183 QRS Duration:  80 QT Interval:  320 QTC Calculation: 431 R Axis:   -9  Text Interpretation: Sinus tachycardia Ventricular premature complex Probable left atrial enlargement Left ventricular hypertrophy Confirmed by Haze Lonni PARAS [phone]) on 06/06/2024 10:49:17 AM  I have reviewed the labs performed to date as well as medications administered while in observation.  Recent changes in the last 24 hours include none.  Plan  Current plan is for TOC placement.    Suzette Pac, MD 06/10/24 [phone]

## 2024-06-10 NOTE — Progress Notes (Addendum)
 Awaiting details of assessment time. Requested EDP order TB Skin test.   Addend @ 8:11AM [facility] assessing around lunchtime. CSW inquired about whether assessment can be completed sooner and she reported she cannot come sooner. ICM following.   Outreached to Garnette to see if [facility] can be pursued simultaneously. Awaiting response.   Addend @ 1:07PM Requested update on assessment. Garnette reported both [facility] and [facility] are having to get approval from corporate teams before offering on the pt. He is awaiting updates and will notify this clinical research associate. ICM following.   Addend @ 2:49PM Provided Garnette with progress notes from last 48 hours.

## 2024-06-11 ENCOUNTER — Telehealth (HOSPITAL_COMMUNITY): Payer: Self-pay | Admitting: Emergency Medicine

## 2024-06-11 MED ORDER — RISPERIDONE 0.25 MG PO TABS: 0.2500 mg | ORAL_TABLET | Freq: Two times a day (BID) | ORAL | 1 refills | Status: DC

## 2024-06-11 NOTE — ED Notes (Signed)
 Patient discharged to home per provider. Patient drowsy .  Patient cooperative. Discharge information and belongings given to the husband. Patient off the unit using wheelchair.  Patient transported by husband.

## 2024-06-11 NOTE — Discharge Instructions (Signed)
 We did add on risperidone  0.25 mg twice daily. Take this instead of the seroquel .   Follow up with your PCP for further medication adjustment.   If anything changes, come back to the ED.

## 2024-06-11 NOTE — ED Provider Notes (Signed)
 Emergency Medicine Observation Re-evaluation Note  Shirley Woods is a 84 y.o. female, seen on rounds today.  Pt initially presented to the ED for complaints of Aggressive Behavior Currently, the patient is resting .  Physical Exam  BP (!) 159/88 (BP Location: Left Arm)   Pulse (!) 103   Temp 98.2 F (36.8 C) (Oral)   Resp 18   SpO2 96%  Physical Exam General: nad    ED Course / MDM  EKG:EKG Interpretation Date/Time:  Saturday June 05 2024 14:19:22 EST Ventricular Rate:  109 PR Interval:  183 QRS Duration:  80 QT Interval:  320 QTC Calculation: 431 R Axis:   -9  Text Interpretation: Sinus tachycardia Ventricular premature complex Probable left atrial enlargement Left ventricular hypertrophy Confirmed by Shirley Woods [phone]) on 06/06/2024 10:49:17 AM  I have reviewed the labs performed to date as well as medications administered while in observation.  Recent changes in the last 24 hours include none .  Plan  Current plan is for TOC placement..   Daughter-in-law will pick patient up today.  Plan for discharge home.   Shirley Lavonia SAILOR, MD 06/11/24 1159

## 2024-06-11 NOTE — Progress Notes (Signed)
 Garnette reported [facility] is unable to accept pt due to having someone in their ALF needing their last available bed.   Garnette is awaiting a response from [facility].   [facility] reviewed pt and is requesting she be monitored on her medications longer before they consider her.

## 2024-06-11 NOTE — Telephone Encounter (Signed)
 Family called the clerk and requested that the pharmacy destination be changed to [facility] in [place] due to concerns for early closure of their previous signed pharmacy.  I have made this change.

## 2024-06-11 NOTE — Progress Notes (Signed)
 CSW spoke w/ pt DIL Sheri. Shirley Woods states she prefers for pt to dc home w/ her while continuing to pursue memory care placement. Sheri reports caregivers are in place and she will continue working w/ Garnette ([facility]) to secure placement. CSW provided Garnette w/ additional documentation to support placement process. Team notified via secure chat to prepare for dc.

## 2024-06-12 ENCOUNTER — Telehealth (HOSPITAL_COMMUNITY): Payer: Self-pay | Admitting: Emergency Medicine

## 2024-06-12 MED ORDER — AMLODIPINE BESYLATE 5 MG PO TABS: 2.5000 mg | ORAL_TABLET | Freq: Every day | ORAL | 0 refills | Status: AC

## 2024-06-12 MED ORDER — DOCUSATE SODIUM 100 MG PO CAPS: 100.0000 mg | ORAL_CAPSULE | Freq: Two times a day (BID) | ORAL | 0 refills | Status: AC

## 2024-06-12 MED ORDER — LAMOTRIGINE 150 MG PO TABS: 150.0000 mg | ORAL_TABLET | Freq: Every day | ORAL | 0 refills | Status: AC

## 2024-06-12 MED ORDER — RISPERIDONE 0.25 MG PO TABS: 0.2500 mg | ORAL_TABLET | Freq: Two times a day (BID) | ORAL | 1 refills | Status: AC

## 2024-06-12 MED ORDER — DONEPEZIL HCL 5 MG PO TABS: 5.0000 mg | ORAL_TABLET | Freq: Every day | ORAL | 3 refills | Status: AC

## 2024-06-12 MED ORDER — ASPIRIN 325 MG PO TABS: 325.0000 mg | ORAL_TABLET | Freq: Two times a day (BID) | ORAL | 0 refills | Status: AC

## 2024-06-12 MED ORDER — VITAMIN D3 25 MCG PO TABS: 2000.0000 [IU] | ORAL_TABLET | Freq: Every day | ORAL | 0 refills | Status: AC

## 2024-06-12 MED ORDER — SERTRALINE HCL 100 MG PO TABS: 100.0000 mg | ORAL_TABLET | Freq: Every evening | ORAL | 0 refills | Status: AC

## 2024-06-12 MED ORDER — ACETAMINOPHEN 500 MG PO TABS: 1000.0000 mg | ORAL_TABLET | Freq: Three times a day (TID) | ORAL | 0 refills | Status: AC | PRN

## 2024-06-12 MED ORDER — CARBOXYMETHYLCELLULOSE SODIUM 1 % OP GEL: 1.0000 "application " | Freq: Every day | OPHTHALMIC | 0 refills | Status: AC | PRN

## 2024-06-12 NOTE — Telephone Encounter (Signed)
 Charge nurse received call from patient's daughter/daughter-in-law that patient had been discharged home, however all of her home medications were at nursing home and nursing and was unwilling to speak with her provider with these medications, therefore home medications reordered to preferred pharmacy based on patient's recent med rec from her recent ED stay.  Sent to the [facility] in [place] per patient's daughter request.

## 2024-06-12 NOTE — ED Notes (Signed)
 06/12/24 1030 Shirley Woods  called requesting refills for pts meds. They were sent to facility when she went in and will not be returning. Family is unable to obtain meds. [facility] has sent the meds to [facility], [place] and aware to pick up by 3 pm due to weather pharmacy may close early.

## 2024-06-14 ENCOUNTER — Ambulatory Visit

## 2024-06-14 ENCOUNTER — Ambulatory Visit: Admitting: Physician Assistant

## 2024-06-21 ENCOUNTER — Ambulatory Visit

## 2024-07-19 ENCOUNTER — Ambulatory Visit: Admitting: Physician Assistant

## 2024-07-19 ENCOUNTER — Ambulatory Visit
